# Patient Record
Sex: Female | Born: 1945 | Race: White | Hispanic: No | Smoking: Former smoker
Health system: Southern US, Community
[De-identification: ages and names within clinical notes are randomized; demographics above are authoritative.]

## PROBLEM LIST (undated history)

## (undated) DIAGNOSIS — M810 Age-related osteoporosis without current pathological fracture: Secondary | ICD-10-CM

## (undated) DIAGNOSIS — E559 Vitamin D deficiency, unspecified: Secondary | ICD-10-CM

## (undated) DIAGNOSIS — D179 Benign lipomatous neoplasm, unspecified: Secondary | ICD-10-CM

## (undated) DIAGNOSIS — H269 Unspecified cataract: Secondary | ICD-10-CM

## (undated) DIAGNOSIS — T7840XA Allergy, unspecified, initial encounter: Secondary | ICD-10-CM

## (undated) DIAGNOSIS — F419 Anxiety disorder, unspecified: Secondary | ICD-10-CM

## (undated) DIAGNOSIS — J449 Chronic obstructive pulmonary disease, unspecified: Secondary | ICD-10-CM

## (undated) DIAGNOSIS — D709 Neutropenia, unspecified: Secondary | ICD-10-CM

## (undated) DIAGNOSIS — J45909 Unspecified asthma, uncomplicated: Secondary | ICD-10-CM

## (undated) HISTORY — DX: Age-related osteoporosis without current pathological fracture: M81.0

## (undated) HISTORY — PX: EYE SURGERY: SHX253

## (undated) HISTORY — DX: Benign lipomatous neoplasm, unspecified: D17.9

## (undated) HISTORY — DX: Chronic obstructive pulmonary disease, unspecified: J44.9

## (undated) HISTORY — DX: Vitamin D deficiency, unspecified: E55.9

## (undated) HISTORY — DX: Anxiety disorder, unspecified: F41.9

## (undated) HISTORY — DX: Unspecified asthma, uncomplicated: J45.909

## (undated) HISTORY — DX: Neutropenia, unspecified: D70.9

## (undated) HISTORY — DX: Unspecified cataract: H26.9

## (undated) HISTORY — DX: Allergy, unspecified, initial encounter: T78.40XA

---

## 2008-09-03 ENCOUNTER — Ambulatory Visit: Payer: Self-pay | Admitting: Family Medicine

## 2009-09-07 ENCOUNTER — Emergency Department: Payer: Self-pay | Admitting: General Practice

## 2014-01-03 ENCOUNTER — Ambulatory Visit: Payer: Self-pay | Admitting: Physician Assistant

## 2014-05-12 ENCOUNTER — Ambulatory Visit: Payer: Self-pay | Admitting: Physician Assistant

## 2015-02-16 ENCOUNTER — Ambulatory Visit: Payer: Self-pay | Admitting: Family Medicine

## 2015-02-16 DIAGNOSIS — J45909 Unspecified asthma, uncomplicated: Secondary | ICD-10-CM | POA: Diagnosis not present

## 2015-02-16 DIAGNOSIS — Z79899 Other long term (current) drug therapy: Secondary | ICD-10-CM | POA: Diagnosis not present

## 2015-02-16 DIAGNOSIS — J9801 Acute bronchospasm: Secondary | ICD-10-CM | POA: Diagnosis not present

## 2015-02-19 ENCOUNTER — Ambulatory Visit: Payer: Self-pay | Admitting: Family Medicine

## 2015-02-19 DIAGNOSIS — J9801 Acute bronchospasm: Secondary | ICD-10-CM | POA: Diagnosis not present

## 2015-02-19 DIAGNOSIS — J40 Bronchitis, not specified as acute or chronic: Secondary | ICD-10-CM | POA: Diagnosis not present

## 2015-02-23 ENCOUNTER — Ambulatory Visit: Payer: Self-pay | Admitting: Family Medicine

## 2015-02-23 DIAGNOSIS — R05 Cough: Secondary | ICD-10-CM | POA: Diagnosis not present

## 2015-05-21 DIAGNOSIS — H3531 Nonexudative age-related macular degeneration: Secondary | ICD-10-CM | POA: Diagnosis not present

## 2015-06-05 ENCOUNTER — Encounter: Payer: Self-pay | Admitting: Family Medicine

## 2015-06-16 DIAGNOSIS — F419 Anxiety disorder, unspecified: Secondary | ICD-10-CM | POA: Insufficient documentation

## 2015-06-16 DIAGNOSIS — D709 Neutropenia, unspecified: Secondary | ICD-10-CM | POA: Insufficient documentation

## 2015-06-16 DIAGNOSIS — M85852 Other specified disorders of bone density and structure, left thigh: Secondary | ICD-10-CM | POA: Insufficient documentation

## 2015-06-16 DIAGNOSIS — E559 Vitamin D deficiency, unspecified: Secondary | ICD-10-CM | POA: Insufficient documentation

## 2015-06-16 DIAGNOSIS — M810 Age-related osteoporosis without current pathological fracture: Secondary | ICD-10-CM | POA: Insufficient documentation

## 2015-06-16 DIAGNOSIS — J45909 Unspecified asthma, uncomplicated: Secondary | ICD-10-CM | POA: Insufficient documentation

## 2015-06-16 DIAGNOSIS — D179 Benign lipomatous neoplasm, unspecified: Secondary | ICD-10-CM | POA: Insufficient documentation

## 2015-06-18 ENCOUNTER — Ambulatory Visit (INDEPENDENT_AMBULATORY_CARE_PROVIDER_SITE_OTHER): Payer: Medicare PPO | Admitting: Family Medicine

## 2015-06-18 ENCOUNTER — Encounter: Payer: Self-pay | Admitting: Family Medicine

## 2015-06-18 VITALS — BP 144/83 | HR 60 | Temp 98.4°F | Ht 60.5 in | Wt 144.0 lb

## 2015-06-18 DIAGNOSIS — F419 Anxiety disorder, unspecified: Secondary | ICD-10-CM

## 2015-06-18 DIAGNOSIS — J4520 Mild intermittent asthma, uncomplicated: Secondary | ICD-10-CM | POA: Diagnosis not present

## 2015-06-18 DIAGNOSIS — E559 Vitamin D deficiency, unspecified: Secondary | ICD-10-CM

## 2015-06-18 DIAGNOSIS — Z0000 Encounter for general adult medical examination without abnormal findings: Secondary | ICD-10-CM

## 2015-06-18 DIAGNOSIS — D709 Neutropenia, unspecified: Secondary | ICD-10-CM | POA: Diagnosis not present

## 2015-06-18 DIAGNOSIS — D179 Benign lipomatous neoplasm, unspecified: Secondary | ICD-10-CM

## 2015-06-18 MED ORDER — SERTRALINE HCL 100 MG PO TABS: 100.0000 mg | ORAL_TABLET | Freq: Every day | ORAL | Status: DC

## 2015-06-18 MED ORDER — BECLOMETHASONE DIPROPIONATE 40 MCG/ACT IN AERS: 2.0000 | INHALATION_SPRAY | Freq: Two times a day (BID) | RESPIRATORY_TRACT | Status: DC

## 2015-06-18 MED ORDER — ALBUTEROL SULFATE HFA 108 (90 BASE) MCG/ACT IN AERS: 2.0000 | INHALATION_SPRAY | RESPIRATORY_TRACT | Status: DC | PRN

## 2015-06-18 MED ORDER — MONTELUKAST SODIUM 10 MG PO TABS: 10.0000 mg | ORAL_TABLET | Freq: Every day | ORAL | Status: DC

## 2015-06-18 NOTE — Progress Notes (Signed)
Patient: Shelly Bridges, Female    DOB: 1946/10/24, 69 y.o.   MRN: [id number]  Visit Date: 06/18/2015  Today's Provider: Enid Derry, MD   Chief Complaint  Patient presents with  . Annual Exam    Medicare Wellness Exam    Subjective:   Shelly Bridges is a 69 y.o. female who presents today for her Subsequent Annual Wellness Visit.  Caregiver input:  none  HPI  No interval excitement  Review of Systems  Past Medical History  Diagnosis Date  . Anxiety   . Osteoporosis   . Vitamin D deficiency disease   . Asthma   . Neutropenia   . Lipoma   right shoulder lipoma  The patient does not have a history of falls. I did not complete a risk assessment for falls. A plan of care for falls was not documented. Depression screen PHQ 2/9 06/18/2015  Decreased Interest 0  Down, Depressed, Hopeless 0  PHQ - 2 Score 0   History reviewed. No pertinent past surgical history.  Family History  Problem Relation Age of Onset  . Dementia Mother   . Hypertension Mother   . Osteoporosis Mother   . Cancer Father     brain tumor and lung  . Hypertension Father   . Dementia Father   . Emphysema Father     History   Social History  . Marital Status: Divorced    Spouse Name: N/A  . Number of Children: 3  . Years of Education: N/A   Occupational History  . Not on file.   Social History Main Topics  . Smoking status: Former Smoker -- 1.00 packs/day for 20 years    Types: Cigarettes    Quit date: 06/17/1985  . Smokeless tobacco: Never Used  . Alcohol Use: No  . Drug Use: No  . Sexual Activity: Not on file   Other Topics Concern  . Not on file   Social History Narrative    Outpatient Encounter Prescriptions as of 06/18/2015  Medication Sig  . albuterol (PROAIR HFA) 108 (90 BASE) MCG/ACT inhaler Inhale 2 puffs into the lungs every 4 (four) hours as needed for wheezing or shortness of breath.  . beclomethasone (QVAR) 40 MCG/ACT inhaler Inhale 2 puffs into the  lungs 2 (two) times daily.  . montelukast (SINGULAIR) 10 MG tablet Take 1 tablet (10 mg total) by mouth at bedtime.  . [DISCONTINUED] albuterol (PROAIR HFA) 108 (90 BASE) MCG/ACT inhaler Inhale 2 puffs into the lungs every 6 (six) hours as needed for wheezing or shortness of breath.  . [DISCONTINUED] beclomethasone (QVAR) 40 MCG/ACT inhaler Inhale 2 puffs into the lungs 2 (two) times daily.  . [DISCONTINUED] montelukast (SINGULAIR) 10 MG tablet Take 10 mg by mouth at bedtime.  . [DISCONTINUED] sertraline (ZOLOFT) 50 MG tablet Take 75 mg by mouth daily.  . sertraline (ZOLOFT) 100 MG tablet Take 1 tablet (100 mg total) by mouth daily.   No facility-administered encounter medications on file as of 06/18/2015.   Functional Ability / Safety Screening 1.  Was the timed Get Up and Go test longer than 30 seconds?  no 2.  Does the patient need help with the phone, transportation, shopping,      preparing meals, housework, laundry, medications, or managing money?  no 3.  Does the patient's home have:  loose throw rugs in the hallway?   no      Grab bars in the bathroom? no      Handrails on the stairs?  not applicable      Poor lighting?   no 4.  Has the patient noticed any hearing difficulties?   no  Fall Risk Assessment See under rooming  Depression Screen See under rooming Depression screen [facility] 2/9 06/18/2015  Decreased Interest 0  Down, Depressed, Hopeless 0  PHQ - 2 Score 0   Advanced Directives Does patient have a HCPOA?    no If yes, name and contact information:  Does patient have a living will or MOST form?  no  Objective:   Vitals: BP 144/83 mmHg  Pulse 60  Temp(Src) 98.4 F (36.9 C)  Ht 5' 0.5" (1.537 m)  Wt 144 lb (65.318 kg)  BMI 27.65 kg/m2  SpO2 98% Body mass index is 27.65 kg/(m^2).  Visual Acuity Screening   Right eye Left eye Both eyes  Without correction:     With correction: 20/20 20/20    Physical Exam  Constitutional: She appears well-developed and  well-nourished. No distress.  HENT:  Head: Normocephalic and atraumatic.  Eyes: No scleral icterus.  Neck: No thyromegaly present.  Cardiovascular: Normal rate.   Pulmonary/Chest: Effort normal.  Abdominal: She exhibits no distension.  Musculoskeletal: She exhibits no edema.  Neurological: She is alert. She displays no tremor. Gait normal.  Get up and go test -- passed  Skin: No erythema. No pallor.  Psychiatric: She has a normal mood and affect. Her behavior is normal. Judgment and thought content normal.   Mood/affect:  euthymic Appearance:  casual  Cognitive Testing - 6-CIT  Correct? Score   What year is it? yes 0 Yes = 0    No = 4  What month is it? yes 0 Yes = 0    No = 3  Remember:     [address]ia [address], [place], [place]     What time is it? yes 0 Yes = 0    No = 3  Count backwards from 20 to 1 yes 1 Correct = 0    1 error = 2   More than 1 error = 4  Say the months of the year in reverse. yes 0 Correct = 0    1 error = 2   More than 1 error = 4  What address did I ask you to remember? yes 0 Correct = 0  1 error = 2    2 error = 4    3 error = 6    4 error = 8    All wrong = 10       TOTAL SCORE  1/28   Interpretation:  Normal  Normal (0-7) Abnormal (8-28)    Assessment & Plan:     Annual Wellness Visit  Reviewed patient's Family Medical History Reviewed and updated list of patient's medical providers Assessment of cognitive impairment was done Assessed patient's functional ability Established a written schedule for health screening services Health Risk Assessent Completed and Reviewed  Exercise Activities and Dietary recommendations Goals    None    She does not get regular activity; she is thinking about joining [employer]; asthma sometimes limits her, but no chest pain; had a bad spell of asthma in March; went to [facility] urgent care 3x, took 6-8 weeks to recover, URI Not getting 3 servings of calcium a day; encourage to get through diet, supplement only  if needed  Immunization History  Administered Date(s) Administered  . Pneumococcal Conjugate-13 07/03/2014    Health Maintenance  Topic Date Due  .  TETANUS/TDAP  02/18/1965  . ZOSTAVAX  02/18/2006  . MAMMOGRAM  09/03/2010  . COLONOSCOPY  06/17/2016 (Originally 02/19/1996)  . PNA vac Low Risk Adult (2 of 2 - PPSV23) 07/04/2015  . INFLUENZA VACCINE  07/06/2015  . DEXA SCAN  Completed   Declined shingles vaccine for now; education given, check insurance coverage; okay to call for that Rx Flu shots every fall Last mammogram was Sept 2009  Discussed health benefits of physical activity, and encouraged her to engage in regular exercise appropriate for her age and condition.   Meds ordered this encounter  Medications  . sertraline (ZOLOFT) 100 MG tablet    Sig: Take 1 tablet (100 mg total) by mouth daily.    Dispense:  30 tablet    Refill:  3    Pharmacist - we are changing dose  . beclomethasone (QVAR) 40 MCG/ACT inhaler    Sig: Inhale 2 puffs into the lungs 2 (two) times daily.    Dispense:  1 Inhaler    Refill:  11  . albuterol (PROAIR HFA) 108 (90 BASE) MCG/ACT inhaler    Sig: Inhale 2 puffs into the lungs every 4 (four) hours as needed for wheezing or shortness of breath.    Dispense:  1 Inhaler    Refill:  1  . montelukast (SINGULAIR) 10 MG tablet    Sig: Take 1 tablet (10 mg total) by mouth at bedtime.    Dispense:  30 tablet    Refill:  11    Current outpatient prescriptions:  .  albuterol (PROAIR HFA) 108 (90 BASE) MCG/ACT inhaler, Inhale 2 puffs into the lungs every 4 (four) hours as needed for wheezing or shortness of breath., Disp: 1 Inhaler, Rfl: 1 .  beclomethasone (QVAR) 40 MCG/ACT inhaler, Inhale 2 puffs into the lungs 2 (two) times daily., Disp: 1 Inhaler, Rfl: 11 .  montelukast (SINGULAIR) 10 MG tablet, Take 1 tablet (10 mg total) by mouth at bedtime., Disp: 30 tablet, Rfl: 11 .  sertraline (ZOLOFT) 100 MG tablet, Take 1 tablet (100 mg total) by mouth daily.,  Disp: 30 tablet, Rfl: 3 Medications Discontinued During This Encounter  Medication Reason  . sertraline (ZOLOFT) 50 MG tablet Dose change  . beclomethasone (QVAR) 40 MCG/ACT inhaler Reorder  . albuterol (PROAIR HFA) 108 (90 BASE) MCG/ACT inhaler Reorder  . montelukast (SINGULAIR) 10 MG tablet Reorder   Next Medicare Wellness Visit in 12+ months  Problem List Items Addressed This Visit      Respiratory   Asthma    Using rescue inhaler less than 2x a week; refills sent      Relevant Medications   beclomethasone (QVAR) 40 MCG/ACT inhaler   albuterol (PROAIR HFA) 108 (90 BASE) MCG/ACT inhaler   montelukast (SINGULAIR) 10 MG tablet     Other   Anxiety    Patient says she has normal stress, family says OCD is worse; let's try increasing the sertraline 100 mg      Relevant Medications   sertraline (ZOLOFT) 100 MG tablet   Vitamin D deficiency disease    Check level today and replace if needed; 1,000 iu vit D3 for maintenance      Relevant Orders   Vit D  25 hydroxy (rtn osteoporosis monitoring)   Neutropenia    Mildly low WBC last year, normal absolutes, just recheck      Relevant Orders   CBC with Differential/Platelet   Lipoma    Benign lesion on right shoulder  Other Visit Diagnoses    Preventative health care    -  Primary

## 2015-06-18 NOTE — Assessment & Plan Note (Signed)
Patient says she has normal stress, family says OCD is worse; let's try increasing the sertraline 100 mg

## 2015-06-18 NOTE — Assessment & Plan Note (Signed)
Benign lesion on right shoulder

## 2015-06-18 NOTE — Patient Instructions (Addendum)
Let's increase the sertraline to 100 mg daily Do please start counseling Let's do have you get labs today and we'll contact you with those results We'll give you the paperwork for the health care power of attorney and living will; review those with a family member and sign in the presence of a notary You will be due for a bone density study on or just after July 09, 2016 We do encouraged mammograms every 1-2 years Please return the stool cards at your earliest convenience If you change your mind and would like to have a colonoscopy, please just call Flu shots every fall If you would like a shingles vaccine, just call for that You are up-to-date on both pneumonia vaccines (PCV-13 and the PPSV-23); you currently do not need any further pneumonia vaccine boosters per the current ACIP guidelines Do try to get 3 servings of calcium a day I'll suggest a baby aspirin daily (81 mg coated) Return in 12+ months for next Medicare visit and 4 weeks for anxiety

## 2015-06-18 NOTE — Assessment & Plan Note (Signed)
Using rescue inhaler less than 2x a week; refills sent

## 2015-06-18 NOTE — Assessment & Plan Note (Signed)
Mildly low WBC last year, normal absolutes, just recheck

## 2015-06-18 NOTE — Assessment & Plan Note (Signed)
Check level today and replace if needed; 1,000 iu vit D3 for maintenance

## 2015-06-19 ENCOUNTER — Encounter: Payer: Self-pay | Admitting: Family Medicine

## 2015-06-19 LAB — VITAMIN D 25 HYDROXY (VIT D DEFICIENCY, FRACTURES): Vit D, 25-Hydroxy: 31.2 ng/mL (ref 30.0–100.0)

## 2015-06-19 LAB — CBC WITH DIFFERENTIAL/PLATELET
Basophils Absolute: 0 10*3/uL (ref 0.0–0.2)
Basos: 0 %
EOS (ABSOLUTE): 0.1 10*3/uL (ref 0.0–0.4)
EOS: 4 %
HEMOGLOBIN: 12.3 g/dL (ref 11.1–15.9)
Hematocrit: 37.6 % (ref 34.0–46.6)
IMMATURE GRANS (ABS): 0 10*3/uL (ref 0.0–0.1)
IMMATURE GRANULOCYTES: 0 %
LYMPHS ABS: 1.1 10*3/uL (ref 0.7–3.1)
LYMPHS: 35 %
MCH: 30.4 pg (ref 26.6–33.0)
MCHC: 32.7 g/dL (ref 31.5–35.7)
MCV: 93 fL (ref 79–97)
MONOCYTES: 8 %
Monocytes Absolute: 0.2 10*3/uL (ref 0.1–0.9)
NEUTROS ABS: 1.7 10*3/uL (ref 1.4–7.0)
Neutrophils: 53 %
Platelets: 172 10*3/uL (ref 150–379)
RBC: 4.04 x10E6/uL (ref 3.77–5.28)
RDW: 12.4 % (ref 12.3–15.4)
WBC: 3.1 10*3/uL — ABNORMAL LOW (ref 3.4–10.8)

## 2015-07-20 ENCOUNTER — Ambulatory Visit: Payer: Medicare PPO | Admitting: Family Medicine

## 2015-07-28 ENCOUNTER — Encounter: Payer: Self-pay | Admitting: Family Medicine

## 2015-07-28 ENCOUNTER — Ambulatory Visit (INDEPENDENT_AMBULATORY_CARE_PROVIDER_SITE_OTHER): Payer: Medicare PPO | Admitting: Family Medicine

## 2015-07-28 VITALS — BP 147/79 | HR 57 | Temp 97.5°F | Wt 143.0 lb

## 2015-07-28 DIAGNOSIS — E559 Vitamin D deficiency, unspecified: Secondary | ICD-10-CM | POA: Diagnosis not present

## 2015-07-28 DIAGNOSIS — I10 Essential (primary) hypertension: Secondary | ICD-10-CM

## 2015-07-28 DIAGNOSIS — F419 Anxiety disorder, unspecified: Secondary | ICD-10-CM | POA: Diagnosis not present

## 2015-07-28 DIAGNOSIS — D709 Neutropenia, unspecified: Secondary | ICD-10-CM | POA: Diagnosis not present

## 2015-07-28 NOTE — Progress Notes (Signed)
BP 147/79 mmHg  Pulse 57  Temp(Src) 97.5 F (36.4 C)  Wt 143 lb (64.864 kg)  SpO2 97%  Recheck 147/79, pulse 57  Subjective:    Patient ID: Shelly Bridges, female    DOB: 06/08/1946, 69 y.o.   MRN: [id number]  HPI: Shelly Bridges is a 69 y.o. female  Chief Complaint  Patient presents with  . Anxiety  . Immunizations    Advised patient that we will do Flu in Sept. She may get the shingles vaccine at her pharmacy. She decided to wait to on teatus.   We reviewed, Pneumovax was received at a health fair in early 2015; she then received PCV-13 here July 2015; the PPSV-23 was received after she turned 45  She is here for follow-up of anxiety; she has been on the 100 mg dose daily; more chilled out; still having trouble worrying and controlling the worrying; granddaughter has noticed some improvement; concerned about granddaughter; she has really backed off; patient is now keeping her cool, "it's her life" when talking about this boy she's seeing; no side effects; she wound like to stay at same dose for a while; she has not started to work with a counselor yet; she is trying to do this on her own; she is getting better  She has joined [employer]; she didn't get to go much last week though, had been going 1 hour a day prior to that, walking, exercises; noticed blood pressure up today; had potato chips for lunch yesterday; no canned soups or frozen dinners; drinks caffeine (sweet tea)  Taking one vitamin D pills daily; weight lifting at [employer] for osteoporosis  Taking ocuvite for eyes, mother had macular degeneration; sees eye doctor regularly  Relevant past medical, surgical, family and social history reviewed and updated as indicated. Interim medical history since our last visit reviewed. Allergies and medications reviewed and updated.  Review of Systems  Per HPI unless specifically indicated above     Objective:    BP 147/79 mmHg  Pulse 57   Temp(Src) 97.5 F (36.4 C)  Wt 143 lb (64.864 kg)  SpO2 97%  Wt Readings from Last 3 Encounters:  07/28/15 143 lb (64.864 kg)  06/18/15 144 lb (65.318 kg)  12/03/14 145 lb (65.772 kg)    Physical Exam  Constitutional: She appears well-developed and well-nourished. No distress.  HENT:  Head: Normocephalic and atraumatic.  Eyes: EOM are normal. No scleral icterus.  Neck: No thyromegaly present.  Cardiovascular: Normal rate and normal heart sounds.   No murmur heard. Pulmonary/Chest: Effort normal. No respiratory distress.  Abdominal: She exhibits no distension.  Musculoskeletal: Normal range of motion. She exhibits no edema.  Neurological: She is alert. She exhibits normal muscle tone.  Skin: Skin is warm and dry. She is not diaphoretic. No pallor.  Psychiatric: She has a normal mood and affect. Her speech is normal and behavior is normal. Judgment and thought content normal. Cognition and memory are normal.    Results for orders placed or performed in visit on 06/18/15  CBC with Differential/Platelet  Result Value Ref Range   WBC 3.1 (L) 3.4 - 10.8 x10E3/uL   RBC 4.04 3.77 - 5.28 x10E6/uL   Hemoglobin 12.3 11.1 - 15.9 g/dL   Hematocrit 37.6 34.0 - 46.6 %   MCV 93 79 - 97 fL   MCH 30.4 26.6 - 33.0 pg   MCHC 32.7 31.5 - 35.7 g/dL   RDW 12.4 12.3 - 15.4 %  Platelets 172 150 - 379 x10E3/uL   Neutrophils 53 %   Lymphs 35 %   Monocytes 8 %   Eos 4 %   Basos 0 %   Neutrophils Absolute 1.7 1.4 - 7.0 x10E3/uL   Lymphocytes Absolute 1.1 0.7 - 3.1 x10E3/uL   Monocytes Absolute 0.2 0.1 - 0.9 x10E3/uL   EOS (ABSOLUTE) 0.1 0.0 - 0.4 x10E3/uL   Basophils Absolute 0.0 0.0 - 0.2 x10E3/uL   Immature Granulocytes 0 %   Immature Grans (Abs) 0.0 0.0 - 0.1 x10E3/uL  Vit D  25 hydroxy (rtn osteoporosis monitoring)  Result Value Ref Range   Vit D, 25-Hydroxy 31.2 30.0 - 100.0 ng/mL      Assessment & Plan:   Problem List Items Addressed This Visit      Cardiovascular and Mediastinum    Essential hypertension, benign - Primary    Stress reduction is key; DASH guidelines; patient wishes to try stress reduction and limiting salt and caffeine and monitor BP away from doctor's office and call if top number over 150 mmHg        Other   Anxiety    Continue sertraline; suggested counseling      Vitamin D deficiency disease    Continue supplementation for bone health      Neutropenia    reviewed previous CBCs with her, explained diagnosis, absolute numbers reviewed, and I am not concerned         Follow up plan: Return in about 3 months (around 10/28/2015) for multiple things.   Meds ordered this encounter  Medications  . sertraline (ZOLOFT) 100 MG tablet    Sig: Take 1 tablet (100 mg total) by mouth daily.    Dispense:  30 tablet    Refill:  3

## 2015-07-28 NOTE — Assessment & Plan Note (Addendum)
reviewed previous CBCs with her, explained diagnosis, absolute numbers reviewed, and I am not concerned

## 2015-07-28 NOTE — Assessment & Plan Note (Signed)
Stress reduction is key; DASH guidelines; patient wishes to try stress reduction and limiting salt and caffeine and monitor BP away from doctor's office and call if top number over 150 mmHg

## 2015-07-28 NOTE — Patient Instructions (Addendum)
You are welcome to the shingles vaccine at any local participating pharmacy Be aware that you can shed live virus particles for up to 8 weeks after getting the vaccine, and avoid being around babies, pregnant women, and people undergoing chemotherapy We do recommend flu shots every fall If you do get injured and receive a puncture wound or deep scratch, then do absolutely come in for a tetanus booster (medicare should cover that) Please do call with an update in 4 weeks or so Try to follow the DASH guidelines     Try the Relaxation Response or meditation or the Serenity Prayer which may be helpful  Steps to Elicit the Relaxation Response The following is the technique reprinted with permission from Dr. Billie Ruddy book The Relaxation Response pages 162-163 1. Sit quietly in a comfortable position. 2. Close your eyes. 3. Deeply relax all your muscles,  beginning at your feet and progressing up to your face.  Keep them relaxed. 4. Breathe through your nose.  Become aware of your breathing.  As you breathe out, say the word, "one"*,  silently to yourself. For example,  breathe in ... out, "one",- in .. out, "one", etc.  Breathe easily and naturally. 5. Continue for 10 to 20 minutes.  You may open your eyes to check the time, but do not use an alarm.  When you finish, sit quietly for several minutes,  at first with your eyes closed and later with your eyes opened.  Do not stand up for a few minutes. 6. Do not worry about whether you are successful  in achieving a deep level of relaxation.  Maintain a passive attitude and permit relaxation to occur at its own pace.  When distracting thoughts occur,  try to ignore them by not dwelling upon them  and return to repeating "one."  With practice, the response should come with little effort.  Practice the technique once or twice daily,  but not within two hours after any meal,  since the digestive processes seem to interfere with   the elicitation of the Relaxation Response. * It is better to use a soothing, mellifluous sound, preferably with no meaning. or association, to avoid stimulation of unnecessary thoughts - a mantra. DASH Eating Plan DASH stands for "Dietary Approaches to Stop Hypertension." The DASH eating plan is a healthy eating plan that has been shown to reduce high blood pressure (hypertension). Additional health benefits may include reducing the risk of type 2 diabetes mellitus, heart disease, and stroke. The DASH eating plan may also help with weight loss. WHAT DO I NEED TO KNOW ABOUT THE DASH EATING PLAN? For the DASH eating plan, you will follow these general guidelines:  Choose foods with a percent daily value for sodium of less than 5% (as listed on the food label).  Use salt-free seasonings or herbs instead of table salt or sea salt.  Check with your health care provider or pharmacist before using salt substitutes.  Eat lower-sodium products, often labeled as "lower sodium" or "no salt added."  Eat fresh foods.  Eat more vegetables, fruits, and low-fat dairy products.  Choose whole grains. Look for the word "whole" as the first word in the ingredient list.  Choose fish and skinless chicken or [place] more often than red meat. Limit fish, poultry, and meat to 6 oz (170 g) each day.  Limit sweets, desserts, sugars, and sugary drinks.  Choose heart-healthy fats.  Limit cheese to 1 oz (28 g) per day.  Eat  more home-cooked food and less restaurant, buffet, and fast food.  Limit fried foods.  Cook foods using methods other than frying.  Limit canned vegetables. If you do use them, rinse them well to decrease the sodium.  When eating at a restaurant, ask that your food be prepared with less salt, or no salt if possible. WHAT FOODS CAN I EAT? Seek help from a dietitian for individual calorie needs. Grains Whole grain or whole wheat bread. Brown rice. Whole grain or whole wheat pasta.  Quinoa, bulgur, and whole grain cereals. Low-sodium cereals. Corn or whole wheat flour tortillas. Whole grain cornbread. Whole grain crackers. Low-sodium crackers. Vegetables Fresh or frozen vegetables (raw, steamed, roasted, or grilled). Low-sodium or reduced-sodium tomato and vegetable juices. Low-sodium or reduced-sodium tomato sauce and paste. Low-sodium or reduced-sodium canned vegetables.  Fruits All fresh, canned (in natural juice), or frozen fruits. Meat and Other Protein Products Ground beef (85% or leaner), grass-fed beef, or beef trimmed of fat. Skinless chicken or [place]. Ground chicken or [place]. Pork trimmed of fat. All fish and seafood. Eggs. Dried beans, peas, or lentils. Unsalted nuts and seeds. Unsalted canned beans. Dairy Low-fat dairy products, such as skim or 1% milk, 2% or reduced-fat cheeses, low-fat ricotta or cottage cheese, or plain low-fat yogurt. Low-sodium or reduced-sodium cheeses. Fats and Oils Tub margarines without trans fats. Light or reduced-fat mayonnaise and salad dressings (reduced sodium). Avocado. Safflower, olive, or canola oils. Natural peanut or almond butter. Other Unsalted popcorn and pretzels. The items listed above may not be a complete list of recommended foods or beverages. Contact your dietitian for more options. WHAT FOODS ARE NOT RECOMMENDED? Grains White bread. White pasta. White rice. Refined cornbread. Bagels and croissants. Crackers that contain trans fat. Vegetables Creamed or fried vegetables. Vegetables in a cheese sauce. Regular canned vegetables. Regular canned tomato sauce and paste. Regular tomato and vegetable juices. Fruits Dried fruits. Canned fruit in light or heavy syrup. Fruit juice. Meat and Other Protein Products Fatty cuts of meat. Ribs, chicken wings, bacon, sausage, bologna, salami, chitterlings, fatback, hot dogs, bratwurst, and packaged luncheon meats. Salted nuts and seeds. Canned beans with salt. Dairy Whole or 2%  milk, cream, half-and-half, and cream cheese. Whole-fat or sweetened yogurt. Full-fat cheeses or blue cheese. Nondairy creamers and whipped toppings. Processed cheese, cheese spreads, or cheese curds. Condiments Onion and garlic salt, seasoned salt, table salt, and sea salt. Canned and packaged gravies. Worcestershire sauce. Tartar sauce. Barbecue sauce. Teriyaki sauce. Soy sauce, including reduced sodium. Steak sauce. Fish sauce. Oyster sauce. Cocktail sauce. Horseradish. Ketchup and mustard. Meat flavorings and tenderizers. Bouillon cubes. Hot sauce. Tabasco sauce. Marinades. Taco seasonings. Relishes. Fats and Oils Butter, stick margarine, lard, shortening, ghee, and bacon fat. Coconut, palm kernel, or palm oils. Regular salad dressings. Other Pickles and olives. Salted popcorn and pretzels. The items listed above may not be a complete list of foods and beverages to avoid. Contact your dietitian for more information. WHERE CAN I FIND MORE INFORMATION? National Heart, Lung, and Blood Institute: travelstabloid.com Document Released: 11/10/2011 Document Revised: 04/07/2014 Document Reviewed: 09/25/2013 [facility] Patient Information 2015 [place], [place]. This information is not intended to replace advice given to you by your health care provider. Make sure you discuss any questions you have with your health care provider.

## 2015-07-30 MED ORDER — SERTRALINE HCL 100 MG PO TABS: 100.0000 mg | ORAL_TABLET | Freq: Every day | ORAL | Status: DC

## 2015-07-30 NOTE — Assessment & Plan Note (Signed)
Continue sertraline; suggested counseling

## 2015-07-30 NOTE — Assessment & Plan Note (Signed)
Continue supplementation for bone health

## 2015-09-07 ENCOUNTER — Telehealth: Payer: Self-pay | Admitting: Family Medicine

## 2015-09-07 NOTE — Telephone Encounter (Signed)
Pt called stated she needs the names of the counselors Dr. Sanda Klein recommended. Please contact pt with their names ASAP. Thanks.

## 2015-09-07 NOTE — Telephone Encounter (Signed)
I just sent the list to your printer; thanks

## 2015-09-07 NOTE — Telephone Encounter (Signed)
Dr.Lada is this the list that the other providers use?

## 2015-09-09 NOTE — Telephone Encounter (Signed)
Tiffany, did you get these papers for her?

## 2015-09-11 NOTE — Telephone Encounter (Signed)
Yes, I sent this to her.

## 2015-10-19 IMAGING — CR DG CHEST 2V
2 series · 2 of 2 positions shown · non-contrast
Comparison: None.

CLINICAL DATA: Cough for 2 weeks

EXAM:
CHEST  2 VIEW

[chest pa]
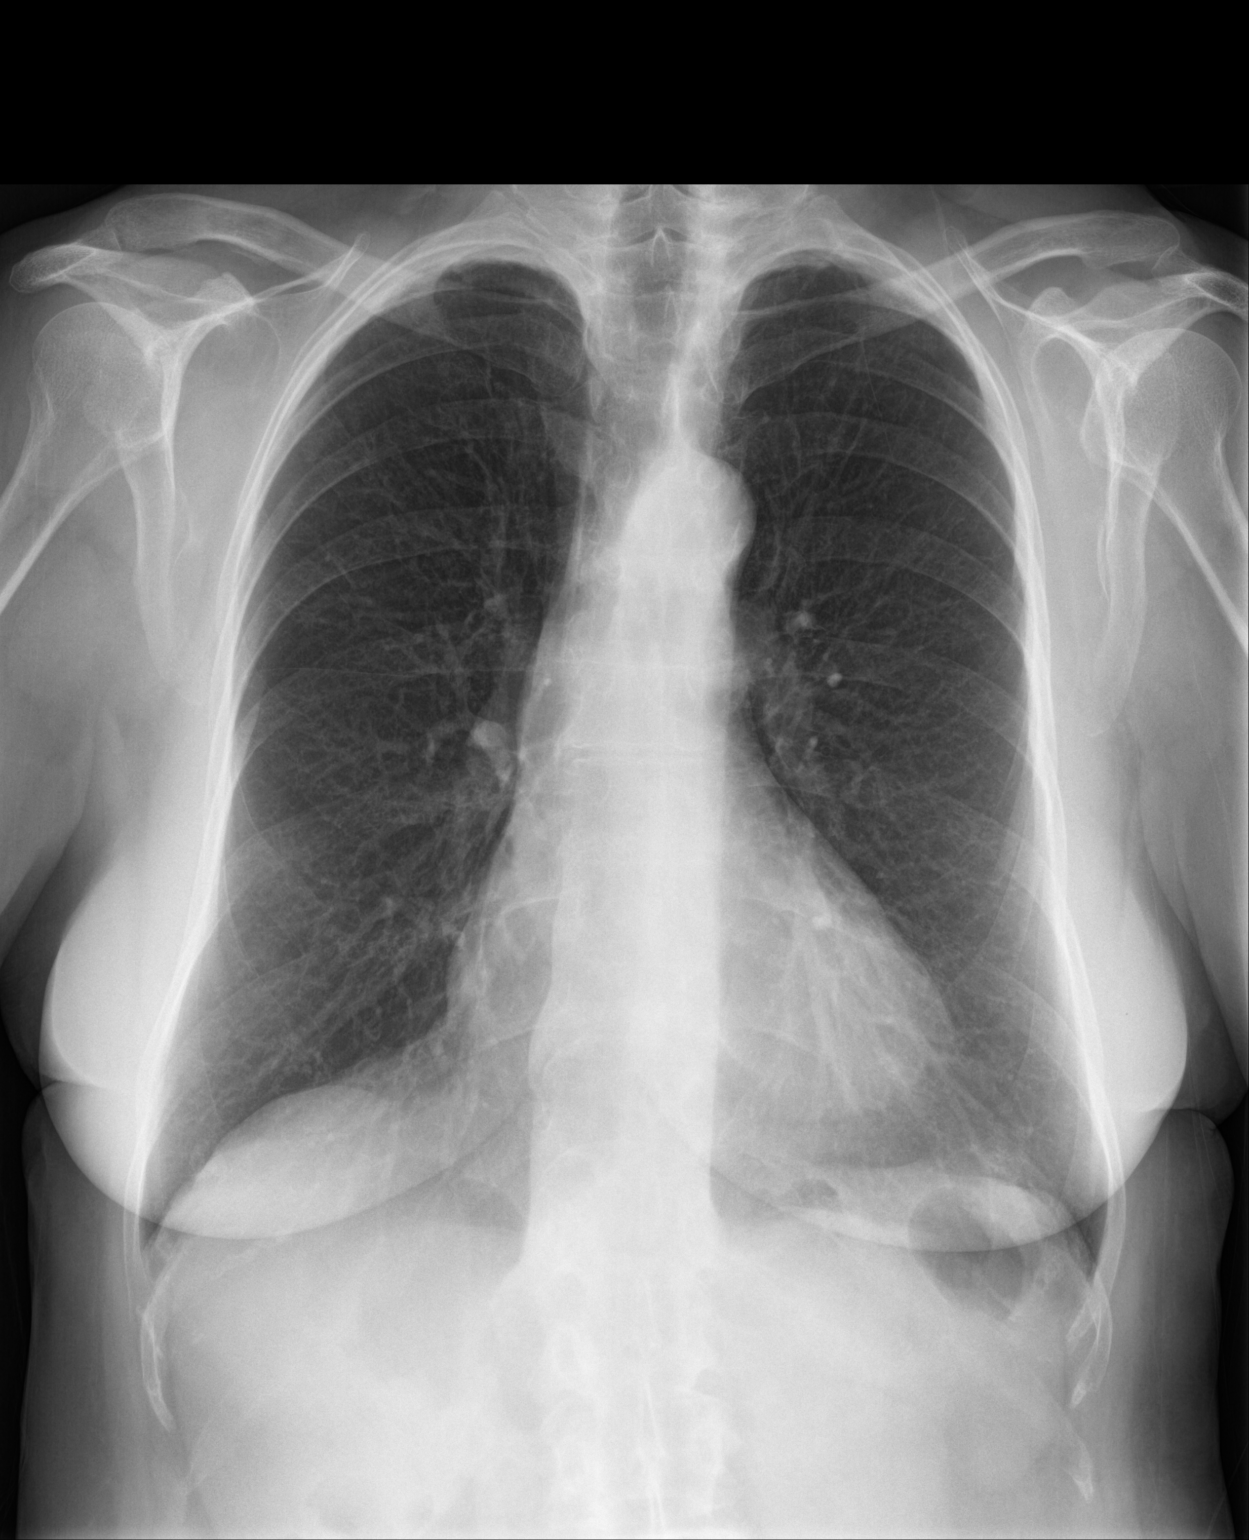

[chest lat]
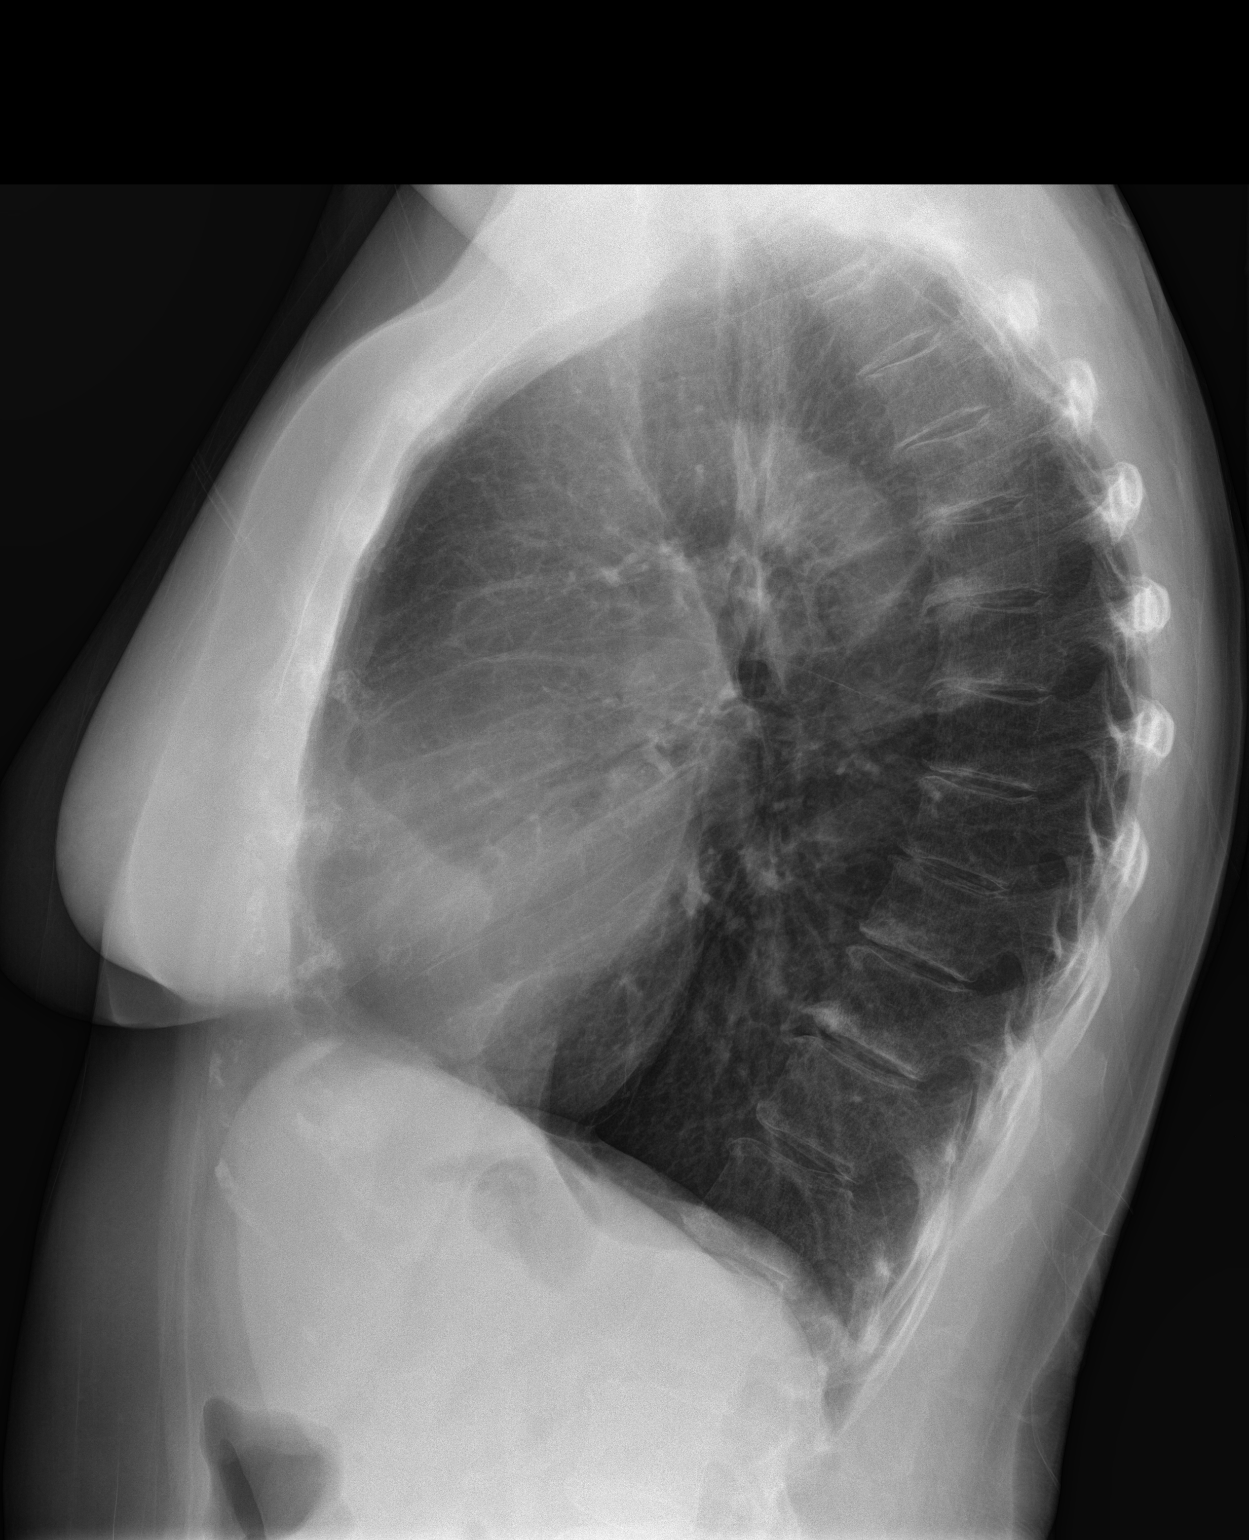

[2 of 2 positions shown; findings below may reference images not displayed]

FINDINGS: The heart size and mediastinal contours are within normal limits.
There is no focal infiltrate, pulmonary edema, or pleural effusion.
Degenerative joint changes of the spine are noted.
IMPRESSION: No active cardiopulmonary disease.

## 2015-10-26 ENCOUNTER — Encounter: Payer: Self-pay | Admitting: Family Medicine

## 2015-10-26 ENCOUNTER — Ambulatory Visit (INDEPENDENT_AMBULATORY_CARE_PROVIDER_SITE_OTHER): Payer: Medicare PPO | Admitting: Family Medicine

## 2015-10-26 VITALS — BP 150/76 | HR 62 | Temp 97.4°F | Ht 59.75 in | Wt 148.0 lb

## 2015-10-26 DIAGNOSIS — Z23 Encounter for immunization: Secondary | ICD-10-CM | POA: Diagnosis not present

## 2015-10-26 DIAGNOSIS — F419 Anxiety disorder, unspecified: Secondary | ICD-10-CM

## 2015-10-26 DIAGNOSIS — I10 Essential (primary) hypertension: Secondary | ICD-10-CM

## 2015-10-26 MED ORDER — SERTRALINE HCL 100 MG PO TABS: 150.0000 mg | ORAL_TABLET | Freq: Every day | ORAL | Status: DC

## 2015-10-26 NOTE — Progress Notes (Signed)
BP 150/76 mmHg  Pulse 62  Temp(Src) 97.4 F (36.3 C)  Ht 4' 11.75" (1.518 m)  Wt 148 lb (67.132 kg)  BMI 29.13 kg/m2  SpO2 97%   Subjective:    Patient ID: Shelly Bridges, female    DOB: 07-31-1946, 69 y.o.   MRN: [id number]  HPI: Shelly Bridges is a 69 y.o. female  Chief Complaint  Patient presents with  . Hypertension  . Anxiety    Had increased Sertraline to 100mg  at last appt. but some days she thinks she could do 150mg .   Her anxiety and her blood pressure go together she says; her blood pressure is usually better than this, but she notices it goes up when upset She called back after our last appt to get the names of some of the counselors, but patient has not called yet to set up an appt to start counseling; she plans on doing so She discussed her granddaughter's engagement and her choice in fiance  She is upset about the choices she is making, wants better for her granddaughter  Just got flu and pneumonia vaccines before I walked in the door  Relevant past medical history reviewed and updated as indicated. Interim medical history since our last visit reviewed. Allergies and medications reviewed and updated.  Review of Systems Per HPI unless specifically indicated above     Objective:    BP 150/76 mmHg  Pulse 62  Temp(Src) 97.4 F (36.3 C)  Ht 4' 11.75" (1.518 m)  Wt 148 lb (67.132 kg)  BMI 29.13 kg/m2  SpO2 97%  Wt Readings from Last 3 Encounters:  10/26/15 148 lb (67.132 kg)  07/28/15 143 lb (64.864 kg)  06/18/15 144 lb (65.318 kg)    Physical Exam  Constitutional: She appears well-developed and well-nourished.  Cardiovascular: Normal rate.   Pulmonary/Chest: Effort normal.  Neurological: She displays no tremor.  Psychiatric: Her mood appears anxious. Her affect is not angry, not labile and not inappropriate. Her speech is not rapid and/or pressured. She is not agitated, not hyperactive, not slowed and not withdrawn. Cognition and  memory are not impaired. She does not exhibit a depressed mood. She is attentive.       Assessment & Plan:   Problem List Items Addressed This Visit      Cardiovascular and Mediastinum   Essential hypertension, benign    Not to goal, but patient will monitor this; she does not want additional medicine, believes the BP is tied to her anxiety; no stress is worth a stroke; counseling, stress reduction encouraged        Other   Anxiety - Primary    Encouraged counseling; she has the names and numbers and will call to schedule her own appt; increase sertraline from 100 mg to 150 mg daily; call with update in 3-4 weeks      Relevant Medications   sertraline (ZOLOFT) 100 MG tablet   Need for 23-polyvalent pneumococcal polysaccharide vaccine    PPSV-23 given today; no further boosters of this needed per current  ACIP guidelines      Relevant Orders   Pneumococcal polysaccharide vaccine 23-valent greater than or equal to 2yo subcutaneous/IM   Needs flu shot    Flu vaccine offered and given       Other Visit Diagnoses    Encounter for immunization           Follow up plan: Return when due, for Medicare wellness visit.  Meds ordered this encounter  Medications  .  sertraline (ZOLOFT) 100 MG tablet    Sig: Take 1.5 tablets (150 mg total) by mouth daily.    Dispense:  45 tablet    Refill:  3   An after-visit summary was printed and given to the patient at [facility].  Please see the patient instructions which may contain other information and recommendations beyond what is mentioned above in the assessment and plan.  Face-to-face time with patient was more than 15 minutes, >50% time spent counseling and coordination of care ------------------------------------ NOTE: this document is finished; I just can't close it out because vaccine administration needs to be completed Dr. Sanda Klein

## 2015-10-26 NOTE — Patient Instructions (Addendum)
Please do start to work with a counselor, as I think it will help Increase the medicine from 100 to 150 mg daily Call me in 3-4 weeks with an update You received the flu shot today; it should protect you against the flu virus over the coming months; it will take about two weeks for antibodies to develop; do try to stay away from hospitals, nursing homes, and daycares during peak flu season; taking extra vitamin C daily during flu season may help you avoid getting sick You have received the Pneumovax vaccine (PPSV-23) and you will not need another booster of this for the rest of your life per current ACIP guidelines Return for next Medicare Wellness visit, but certainly within 6 months if Medicare visit is after that

## 2015-10-26 NOTE — Assessment & Plan Note (Addendum)
Encouraged counseling; she has the names and numbers and will call to schedule her own appt; increase sertraline from 100 mg to 150 mg daily; call with update in 3-4 weeks

## 2015-11-01 DIAGNOSIS — Z23 Encounter for immunization: Secondary | ICD-10-CM | POA: Insufficient documentation

## 2015-11-01 NOTE — Assessment & Plan Note (Signed)
Flu vaccine offered and given

## 2015-11-01 NOTE — Assessment & Plan Note (Signed)
Not to goal, but patient will monitor this; she does not want additional medicine, believes the BP is tied to her anxiety; no stress is worth a stroke; counseling, stress reduction encouraged

## 2015-11-01 NOTE — Assessment & Plan Note (Signed)
PPSV-23 given today; no further boosters of this needed per current  ACIP guidelines

## 2016-06-03 ENCOUNTER — Ambulatory Visit (INDEPENDENT_AMBULATORY_CARE_PROVIDER_SITE_OTHER): Payer: Medicare Other | Admitting: Unknown Physician Specialty

## 2016-06-03 ENCOUNTER — Encounter: Payer: Self-pay | Admitting: Unknown Physician Specialty

## 2016-06-03 VITALS — BP 147/84 | HR 68 | Temp 97.8°F | Ht 60.7 in | Wt 147.0 lb

## 2016-06-03 DIAGNOSIS — F339 Major depressive disorder, recurrent, unspecified: Secondary | ICD-10-CM | POA: Insufficient documentation

## 2016-06-03 DIAGNOSIS — J4520 Mild intermittent asthma, uncomplicated: Secondary | ICD-10-CM | POA: Diagnosis not present

## 2016-06-03 DIAGNOSIS — G4441 Drug-induced headache, not elsewhere classified, intractable: Secondary | ICD-10-CM

## 2016-06-03 DIAGNOSIS — R42 Dizziness and giddiness: Secondary | ICD-10-CM

## 2016-06-03 DIAGNOSIS — F332 Major depressive disorder, recurrent severe without psychotic features: Secondary | ICD-10-CM

## 2016-06-03 DIAGNOSIS — F329 Major depressive disorder, single episode, unspecified: Secondary | ICD-10-CM | POA: Insufficient documentation

## 2016-06-03 MED ORDER — SERTRALINE HCL 50 MG PO TABS: 50.0000 mg | ORAL_TABLET | Freq: Every day | ORAL | Status: DC

## 2016-06-03 MED ORDER — FLUTICASONE PROPIONATE HFA 110 MCG/ACT IN AERO
2.0000 | INHALATION_SPRAY | Freq: Two times a day (BID) | RESPIRATORY_TRACT | Status: DC
Start: 2016-06-03 — End: 2017-06-09

## 2016-06-03 NOTE — Progress Notes (Signed)
+-  BP 147/84 mmHg  Pulse 68  Temp(Src) 97.8 F (36.6 C)  Ht 5' 0.7" (1.542 m)  Wt 147 lb (66.679 kg)  BMI 28.04 kg/m2  SpO2 97%   Subjective:    Patient ID: Shelly Bridges, female    DOB: 03/19/1946, 70 y.o.   MRN: [id number]  HPI: Shelly Bridges is a 70 y.o. female  Chief Complaint  Patient presents with  . URI    pt states she has a headache, dizziness, and sinus pressure. States symptoms started about a month ago.    Sinusitis This is a new problem. The current episode started more than 1 month ago. The problem has been gradually worsening since onset. There has been no fever. The pain is moderate. Associated symptoms include congestion, coughing, ear pain, headaches, a hoarse voice and sinus pressure. Pertinent negatives include no chills, shortness of breath or sore throat. Past treatments include nothing (using inhalers).   Depression Stopped Zoloft because she feels she didn't need it and finds that she is having a lot of depression.  Stopped cold [place] about a month ago that seems to coincide with these symptoms.   Depression screen [facility] 2/9 06/03/2016 06/18/2015  Decreased Interest 3 0  Down, Depressed, Hopeless 3 0  PHQ - 2 Score 6 0  Altered sleeping 3 -  Tired, decreased energy 3 -  Change in appetite 2 -  Feeling bad or failure about yourself  1 -  Trouble concentrating 1 -  Moving slowly or fidgety/restless 2 -  Suicidal thoughts 0 -  PHQ-9 Score 18 -    Asthma Seems to be stable but Qvar no longer covered.    Relevant past medical, surgical, family and social history reviewed and updated as indicated. Interim medical history since our last visit reviewed. Allergies and medications reviewed and updated.  Review of Systems  Constitutional: Negative for chills.  HENT: Positive for congestion, ear pain, hoarse voice and sinus pressure. Negative for sore throat.   Respiratory: Positive for cough. Negative for shortness of breath.    Neurological: Positive for headaches.    Per HPI unless specifically indicated above     Objective:    BP 147/84 mmHg  Pulse 68  Temp(Src) 97.8 F (36.6 C)  Ht 5' 0.7" (1.542 m)  Wt 147 lb (66.679 kg)  BMI 28.04 kg/m2  SpO2 97%  Wt Readings from Last 3 Encounters:  06/03/16 147 lb (66.679 kg)  10/26/15 148 lb (67.132 kg)  07/28/15 143 lb (64.864 kg)    Physical Exam  Constitutional: She is oriented to person, place, and time. She appears well-developed and well-nourished.  HENT:  Head: Normocephalic and atraumatic.  Eyes: Pupils are equal, round, and reactive to light. Right eye exhibits no discharge. Left eye exhibits no discharge. No scleral icterus.  Neck: Normal range of motion. Neck supple. Carotid bruit is not present. No thyromegaly present.  Cardiovascular: Normal rate, regular rhythm and normal heart sounds.  Exam reveals no gallop and no friction rub.   No murmur heard. Pulmonary/Chest: Effort normal and breath sounds normal. No respiratory distress. She has no wheezes. She has no rales.  Abdominal: Soft. Bowel sounds are normal. There is no tenderness. There is no rebound.  Genitourinary: No breast swelling, tenderness or discharge.  Musculoskeletal: Normal range of motion.  Lymphadenopathy:    She has no cervical adenopathy.  Neurological: She is alert and oriented to person, place, and time.  Skin: Skin is warm, dry and intact.  No rash noted.  Psychiatric: She has a normal mood and affect. Her speech is normal and behavior is normal. Judgment and thought content normal. Cognition and memory are normal.    Results for orders placed or performed in visit on 06/18/15  CBC with Differential/Platelet  Result Value Ref Range   WBC 3.1 (L) 3.4 - 10.8 x10E3/uL   RBC 4.04 3.77 - 5.28 x10E6/uL   Hemoglobin 12.3 11.1 - 15.9 g/dL   Hematocrit 37.6 34.0 - 46.6 %   MCV 93 79 - 97 fL   MCH 30.4 26.6 - 33.0 pg   MCHC 32.7 31.5 - 35.7 g/dL   RDW 12.4 12.3 - 15.4 %    Platelets 172 150 - 379 x10E3/uL   Neutrophils 53 %   Lymphs 35 %   Monocytes 8 %   Eos 4 %   Basos 0 %   Neutrophils Absolute 1.7 1.4 - 7.0 x10E3/uL   Lymphocytes Absolute 1.1 0.7 - 3.1 x10E3/uL   Monocytes Absolute 0.2 0.1 - 0.9 x10E3/uL   EOS (ABSOLUTE) 0.1 0.0 - 0.4 x10E3/uL   Basophils Absolute 0.0 0.0 - 0.2 x10E3/uL   Immature Granulocytes 0 %   Immature Grans (Abs) 0.0 0.0 - 0.1 x10E3/uL  Vit D  25 hydroxy (rtn osteoporosis monitoring)  Result Value Ref Range   Vit D, 25-Hydroxy 31.2 30.0 - 100.0 ng/mL      Assessment & Plan:   Problem List Items Addressed This Visit      Unprioritized   Asthma    Change from Qvar to Flovent for insurance issues      Relevant Medications   fluticasone (FLOVENT HFA) 110 MCG/ACT inhaler   Major depression (HCC) - Primary   Relevant Medications   sertraline (ZOLOFT) 50 MG tablet    Other Visit Diagnoses    Intractable drug-induced headache, not elsewhere classified        Relevant Medications    sertraline (ZOLOFT) 50 MG tablet    Dizzy           Suspect above symptoms related not to sinuses but to stopping a fairly large dose of Zoloft abruptly.  Restart Zoloft at 50 mg.    Follow up plan: Return in about 4 weeks (around 07/01/2016).

## 2016-06-03 NOTE — Assessment & Plan Note (Signed)
Change from Qvar to [employer] for insurance issues

## 2016-06-21 ENCOUNTER — Ambulatory Visit: Payer: Medicare PPO | Admitting: Family Medicine

## 2016-07-01 ENCOUNTER — Ambulatory Visit (INDEPENDENT_AMBULATORY_CARE_PROVIDER_SITE_OTHER): Payer: Medicare Other | Admitting: Unknown Physician Specialty

## 2016-07-01 ENCOUNTER — Encounter: Payer: Self-pay | Admitting: Unknown Physician Specialty

## 2016-07-01 DIAGNOSIS — F332 Major depressive disorder, recurrent severe without psychotic features: Secondary | ICD-10-CM | POA: Diagnosis not present

## 2016-07-01 MED ORDER — SERTRALINE HCL 50 MG PO TABS: 50.0000 mg | ORAL_TABLET | Freq: Every day | ORAL | 3 refills | Status: DC

## 2016-07-01 NOTE — Progress Notes (Signed)
BP 122/68 (BP Location: Left Arm, Patient Position: Sitting, Cuff Size: Normal)   Pulse 64   Temp 98.5 F (36.9 C)   Ht 5\' 1"  (1.549 m)   Wt 145 lb (65.8 kg)   SpO2 96%   BMI 27.40 kg/m    Subjective:    Patient ID: Shelly Bridges, female    DOB: [birth year], 70 y.o.   MRN: [id number]  HPI: Shelly Bridges is a 70 y.o. female  Chief Complaint  Patient presents with  . Depression   Depression Pt had stopped Zoloft before her previous visit and had a great deal of depression following.  She is doing much better today.  Depression screen [facility] 2/9 07/01/2016 06/03/2016 06/18/2015  Decreased Interest 0 3 0  Down, Depressed, Hopeless 0 3 0  PHQ - 2 Score 0 6 0  Altered sleeping - 3 -  Tired, decreased energy - 3 -  Change in appetite - 2 -  Feeling bad or failure about yourself  - 1 -  Trouble concentrating - 1 -  Moving slowly or fidgety/restless - 2 -  Suicidal thoughts - 0 -  PHQ-9 Score - 18 -      Relevant past medical, surgical, family and social history reviewed and updated as indicated. Interim medical history since our last visit reviewed. Allergies and medications reviewed and updated.  Review of Systems  Per HPI unless specifically indicated above     Objective:    BP 122/68 (BP Location: Left Arm, Patient Position: Sitting, Cuff Size: Normal)   Pulse 64   Temp 98.5 F (36.9 C)   Ht 5\' 1"  (1.549 m)   Wt 145 lb (65.8 kg)   SpO2 96%   BMI 27.40 kg/m   Wt Readings from Last 3 Encounters:  07/01/16 145 lb (65.8 kg)  06/03/16 147 lb (66.7 kg)  10/26/15 148 lb (67.1 kg)    Physical Exam  Constitutional: She is oriented to person, place, and time. She appears well-developed and well-nourished. No distress.  HENT:  Head: Normocephalic and atraumatic.  Eyes: Conjunctivae and lids are normal. Right eye exhibits no discharge. Left eye exhibits no discharge. No scleral icterus.  Cardiovascular: Normal rate.   Pulmonary/Chest: Effort normal.    Abdominal: Normal appearance. There is no splenomegaly or hepatomegaly.  Musculoskeletal: Normal range of motion.  Neurological: She is alert and oriented to person, place, and time.  Skin: Skin is intact. No rash noted. No pallor.  Psychiatric: She has a normal mood and affect. Her behavior is normal. Judgment and thought content normal.    Results for orders placed or performed in visit on 06/18/15  CBC with Differential/Platelet  Result Value Ref Range   WBC 3.1 (L) 3.4 - 10.8 x10E3/uL   RBC 4.04 3.77 - 5.28 x10E6/uL   Hemoglobin 12.3 11.1 - 15.9 g/dL   Hematocrit 37.6 34.0 - 46.6 %   MCV 93 79 - 97 fL   MCH 30.4 26.6 - 33.0 pg   MCHC 32.7 31.5 - 35.7 g/dL   RDW 12.4 12.3 - 15.4 %   Platelets 172 150 - 379 x10E3/uL   Neutrophils 53 %   Lymphs 35 %   Monocytes 8 %   Eos 4 %   Basos 0 %   Neutrophils Absolute 1.7 1.4 - 7.0 x10E3/uL   Lymphocytes Absolute 1.1 0.7 - 3.1 x10E3/uL   Monocytes Absolute 0.2 0.1 - 0.9 x10E3/uL   EOS (ABSOLUTE) 0.1 0.0 - 0.4 x10E3/uL  Basophils Absolute 0.0 0.0 - 0.2 x10E3/uL   Immature Granulocytes 0 %   Immature Grans (Abs) 0.0 0.0 - 0.1 x10E3/uL  Vit D  25 hydroxy (rtn osteoporosis monitoring)  Result Value Ref Range   Vit D, 25-Hydroxy 31.2 30.0 - 100.0 ng/mL      Assessment & Plan:   Problem List Items Addressed This Visit      Unprioritized   Major depression ([place])    Pt is doing well on current dose.  She is not interested in weaning off.        Relevant Medications   sertraline (ZOLOFT) 50 MG tablet    Other Visit Diagnoses   None.      Follow up plan: Return if symptoms worsen or fail to improve, for Needs wellness visit.

## 2016-07-01 NOTE — Assessment & Plan Note (Signed)
Pt is doing well on current dose.  She is not interested in weaning off.

## 2016-08-12 ENCOUNTER — Other Ambulatory Visit: Payer: Self-pay | Admitting: Unknown Physician Specialty

## 2016-09-30 ENCOUNTER — Encounter: Payer: Self-pay | Admitting: Unknown Physician Specialty

## 2016-09-30 ENCOUNTER — Ambulatory Visit (INDEPENDENT_AMBULATORY_CARE_PROVIDER_SITE_OTHER): Payer: Medicare Other | Admitting: Unknown Physician Specialty

## 2016-09-30 VITALS — BP 152/87 | HR 76 | Temp 97.9°F | Ht 60.0 in | Wt 145.0 lb

## 2016-09-30 DIAGNOSIS — I10 Essential (primary) hypertension: Secondary | ICD-10-CM

## 2016-09-30 DIAGNOSIS — F332 Major depressive disorder, recurrent severe without psychotic features: Secondary | ICD-10-CM

## 2016-09-30 DIAGNOSIS — J4541 Moderate persistent asthma with (acute) exacerbation: Secondary | ICD-10-CM | POA: Diagnosis not present

## 2016-09-30 DIAGNOSIS — I499 Cardiac arrhythmia, unspecified: Secondary | ICD-10-CM | POA: Diagnosis not present

## 2016-09-30 DIAGNOSIS — Z0000 Encounter for general adult medical examination without abnormal findings: Secondary | ICD-10-CM

## 2016-09-30 DIAGNOSIS — J4540 Moderate persistent asthma, uncomplicated: Secondary | ICD-10-CM | POA: Diagnosis not present

## 2016-09-30 MED ORDER — PREDNISONE 20 MG PO TABS: 40.0000 mg | ORAL_TABLET | Freq: Every day | ORAL | 0 refills | Status: DC

## 2016-09-30 MED ORDER — ALBUTEROL SULFATE HFA 108 (90 BASE) MCG/ACT IN AERS: 2.0000 | INHALATION_SPRAY | RESPIRATORY_TRACT | 1 refills | Status: DC | PRN

## 2016-09-30 MED ORDER — HYDROCOD POLST-CPM POLST ER 10-8 MG/5ML PO SUER: 5.0000 mL | Freq: Two times a day (BID) | ORAL | 0 refills | Status: DC | PRN

## 2016-09-30 NOTE — Assessment & Plan Note (Signed)
Stable, continue present medications.   

## 2016-09-30 NOTE — Assessment & Plan Note (Signed)
Recheck in 1 month.  Bring readings from home

## 2016-09-30 NOTE — Progress Notes (Signed)
BP (!) 152/87 (BP Location: Left Arm, Patient Position: Sitting, Cuff Size: Large)   Pulse 76   Temp 97.9 F (36.6 C)   Ht 5' (1.524 m)   Wt 145 lb (65.8 kg)   SpO2 96%   BMI 28.32 kg/m    Subjective:    Patient ID: Shelly Bridges, female    DOB: 1946/09/20, 70 y.o.   MRN: [id number]  HPI: Shelly Bridges is a 70 y.o. female  Chief Complaint  Patient presents with  . Medicare Wellness  . URI    pt states she has had a cough for a few weeks now. States tussinex and prednisone have worked in the past   . Medication Refill    pt states she needs refills on albuterol and singulair    Functional Status Survey: Is the patient deaf or have difficulty hearing?: No Does the patient have difficulty seeing, even when wearing glasses/contacts?: No Does the patient have difficulty concentrating, remembering, or making decisions?: No Does the patient have difficulty walking or climbing stairs?: No Does the patient have difficulty dressing or bathing?: No Does the patient have difficulty doing errands alone such as visiting a doctor's office or shopping?: No  Fall Risk  09/30/2016 07/01/2016 06/18/2015  Falls in the past year? No No No   Depression Sell controlled  Depression screen [facility] 2/9 09/30/2016 07/01/2016 06/03/2016 06/18/2015  Decreased Interest 1 0 3 0  Down, Depressed, Hopeless 1 0 3 0  PHQ - 2 Score 2 0 6 0  Altered sleeping - - 3 -  Tired, decreased energy - - 3 -  Change in appetite - - 2 -  Feeling bad or failure about yourself  - - 1 -  Trouble concentrating - - 1 -  Moving slowly or fidgety/restless - - 2 -  Suicidal thoughts - - 0 -  PHQ-9 Score - - 18 -   Asthma Only takes steroid inhaler when "this is coming on" and has been using for a week.  Admits to needing it more often but does not take due to cost.   Cough  This is a new problem. The current episode started yesterday. The problem has been gradually worsening. The problem occurs  constantly. The cough is non-productive. Associated symptoms include postnasal drip and shortness of breath. Pertinent negatives include no chills, rash or sore throat. Nothing aggravates the symptoms. She has tried a beta-agonist inhaler and steroid inhaler for the symptoms. The treatment provided no relief. Her past medical history is significant for asthma.   Hypertension High here but coughing.  States BP is below 140/90 while at home but does not check it on a regular basis.    Relevant past medical, surgical, family and social history reviewed and updated as indicated. Interim medical history since our last visit reviewed. Allergies and medications reviewed and updated.  Review of Systems  Constitutional: Negative for chills.  HENT: Positive for postnasal drip. Negative for sore throat.   Respiratory: Positive for cough and shortness of breath.   Skin: Negative for rash.    Per HPI unless specifically indicated above     Objective:    BP (!) 152/87 (BP Location: Left Arm, Patient Position: Sitting, Cuff Size: Large)   Pulse 76   Temp 97.9 F (36.6 C)   Ht 5' (1.524 m)   Wt 145 lb (65.8 kg)   SpO2 96%   BMI 28.32 kg/m   Wt Readings from Last 3 Encounters:  09/30/16  145 lb (65.8 kg)  07/01/16 145 lb (65.8 kg)  06/03/16 147 lb (66.7 kg)    Physical Exam  Constitutional: She is oriented to person, place, and time. She appears well-developed and well-nourished.  HENT:  Head: Normocephalic and atraumatic.  Eyes: Pupils are equal, round, and reactive to light. Right eye exhibits no discharge. Left eye exhibits no discharge. No scleral icterus.  Neck: Normal range of motion. Neck supple. Carotid bruit is not present. No thyromegaly present.  Cardiovascular: Normal rate and normal heart sounds.  An irregular rhythm present. Exam reveals no gallop and no friction rub.   No murmur heard. EKG showing PACs  Pulmonary/Chest: Effort normal and breath sounds normal. No respiratory  distress. She has no wheezes. She has no rales.  Abdominal: Soft. Bowel sounds are normal. There is no tenderness. There is no rebound.  Genitourinary: No breast swelling, tenderness or discharge.  Musculoskeletal: Normal range of motion.  Lymphadenopathy:    She has no cervical adenopathy.  Neurological: She is alert and oriented to person, place, and time.  Skin: Skin is warm, dry and intact. No rash noted.  Psychiatric: She has a normal mood and affect. Her speech is normal and behavior is normal. Judgment and thought content normal. Cognition and memory are normal.    Results for orders placed or performed in visit on 06/18/15  CBC with Differential/Platelet  Result Value Ref Range   WBC 3.1 (L) 3.4 - 10.8 x10E3/uL   RBC 4.04 3.77 - 5.28 x10E6/uL   Hemoglobin 12.3 11.1 - 15.9 g/dL   Hematocrit 37.6 34.0 - 46.6 %   MCV 93 79 - 97 fL   MCH 30.4 26.6 - 33.0 pg   MCHC 32.7 31.5 - 35.7 g/dL   RDW 12.4 12.3 - 15.4 %   Platelets 172 150 - 379 x10E3/uL   Neutrophils 53 %   Lymphs 35 %   Monocytes 8 %   Eos 4 %   Basos 0 %   Neutrophils Absolute 1.7 1.4 - 7.0 x10E3/uL   Lymphocytes Absolute 1.1 0.7 - 3.1 x10E3/uL   Monocytes Absolute 0.2 0.1 - 0.9 x10E3/uL   EOS (ABSOLUTE) 0.1 0.0 - 0.4 x10E3/uL   Basophils Absolute 0.0 0.0 - 0.2 x10E3/uL   Immature Granulocytes 0 %   Immature Grans (Abs) 0.0 0.0 - 0.1 x10E3/uL  Vit D  25 hydroxy (rtn osteoporosis monitoring)  Result Value Ref Range   Vit D, 25-Hydroxy 31.2 30.0 - 100.0 ng/mL      Assessment & Plan:   Problem List Items Addressed This Visit      Unprioritized   Asthma   Relevant Medications   predniSONE (DELTASONE) 20 MG tablet   albuterol (PROAIR HFA) 108 (90 Base) MCG/ACT inhaler   Essential hypertension, benign    Recheck in 1 month.  Bring readings from home      Relevant Orders   Comprehensive metabolic panel   Lipid Panel w/o Chol/HDL Ratio   TSH   Major depression    Stable, continue present medications.         Moderate persistent asthma without complication   Relevant Medications   predniSONE (DELTASONE) 20 MG tablet   albuterol (PROAIR HFA) 108 (90 Base) MCG/ACT inhaler    Other Visit Diagnoses    Irregular heart rate    -  Primary   Relevant Orders   EKG 12-Lead (Completed)   Routine general medical examination at a health care facility  Follow up plan: Return in about 4 weeks (around 10/28/2016).  Refusing Mammogram and Cologuard

## 2016-10-01 LAB — COMPREHENSIVE METABOLIC PANEL
A/G RATIO: 1.6 (ref 1.2–2.2)
ALBUMIN: 4.1 g/dL (ref 3.5–4.8)
ALK PHOS: 84 IU/L (ref 39–117)
ALT: 7 IU/L (ref 0–32)
AST: 11 IU/L (ref 0–40)
BILIRUBIN TOTAL: 0.5 mg/dL (ref 0.0–1.2)
BUN / CREAT RATIO: 8 — AB (ref 12–28)
BUN: 7 mg/dL — ABNORMAL LOW (ref 8–27)
CO2: 27 mmol/L (ref 18–29)
CREATININE: 0.91 mg/dL (ref 0.57–1.00)
Calcium: 9.1 mg/dL (ref 8.7–10.3)
Chloride: 102 mmol/L (ref 96–106)
GFR calc Af Amer: 74 mL/min/{1.73_m2} (ref >59)
GFR calc non Af Amer: 64 mL/min/{1.73_m2} (ref >59)
GLOBULIN, TOTAL: 2.6 g/dL (ref 1.5–4.5)
Glucose: 79 mg/dL (ref 65–99)
POTASSIUM: 4 mmol/L (ref 3.5–5.2)
SODIUM: 141 mmol/L (ref 134–144)
Total Protein: 6.7 g/dL (ref 6.0–8.5)

## 2016-10-01 LAB — LIPID PANEL W/O CHOL/HDL RATIO
CHOLESTEROL TOTAL: 188 mg/dL (ref 100–199)
HDL: 64 mg/dL (ref >39)
LDL CALC: 113 mg/dL — AB (ref 0–99)
Triglycerides: 53 mg/dL (ref 0–149)
VLDL Cholesterol Cal: 11 mg/dL (ref 5–40)

## 2016-10-01 LAB — TSH: TSH: 2.47 u[IU]/mL (ref 0.450–4.500)

## 2016-10-03 ENCOUNTER — Encounter: Payer: Self-pay | Admitting: Unknown Physician Specialty

## 2016-10-03 NOTE — Progress Notes (Signed)
Notified pt by mychart

## 2016-10-25 ENCOUNTER — Encounter: Payer: Self-pay | Admitting: Unknown Physician Specialty

## 2016-10-25 ENCOUNTER — Ambulatory Visit (INDEPENDENT_AMBULATORY_CARE_PROVIDER_SITE_OTHER): Payer: Medicare Other | Admitting: Unknown Physician Specialty

## 2016-10-25 VITALS — BP 164/83 | HR 43 | Temp 98.5°F | Wt 146.0 lb

## 2016-10-25 DIAGNOSIS — I10 Essential (primary) hypertension: Secondary | ICD-10-CM | POA: Diagnosis not present

## 2016-10-25 DIAGNOSIS — B029 Zoster without complications: Secondary | ICD-10-CM | POA: Diagnosis not present

## 2016-10-25 DIAGNOSIS — Z23 Encounter for immunization: Secondary | ICD-10-CM

## 2016-10-25 DIAGNOSIS — L0100 Impetigo, unspecified: Secondary | ICD-10-CM

## 2016-10-25 MED ORDER — MUPIROCIN 2 % EX OINT: 1.0000 "application " | TOPICAL_OINTMENT | Freq: Two times a day (BID) | CUTANEOUS | 0 refills | Status: DC

## 2016-10-25 MED ORDER — HYDROCHLOROTHIAZIDE 25 MG PO TABS: 25.0000 mg | ORAL_TABLET | Freq: Every day | ORAL | 1 refills | Status: DC

## 2016-10-25 MED ORDER — VALACYCLOVIR HCL 1 G PO TABS: 1000.0000 mg | ORAL_TABLET | Freq: Three times a day (TID) | ORAL | 0 refills | Status: DC

## 2016-10-25 NOTE — Assessment & Plan Note (Signed)
BP is under poor control.  Will start HCTZ

## 2016-10-25 NOTE — Progress Notes (Signed)
BP (!) 164/83 (BP Location: Left Arm, Patient Position: Sitting, Cuff Size: Normal)   Pulse (!) 43   Temp 98.5 F (36.9 C)   Wt 146 lb (66.2 kg)   SpO2 96%   BMI 28.51 kg/m    Subjective:    Patient ID: Shelly Bridges, female    DOB: 1946-10-29, 70 y.o.   MRN: [id number]  HPI: Shelly Bridges is a 70 y.o. female  Chief Complaint  Patient presents with  . Rash    pt states she has a rash on right side of her nose that started 2 weeks ago, and now has it on her neck. States it itches very bad.    Pt with rash right side of nose.  States there is a scaly area right labial fold.    States she started with a rash on the right side of neck starting yesterday.  States it itches.    Hypertension Using medications without difficulty Average home BPs125-165/70's-  No problems or lightheadedness No chest pain with exertion or shortness of breath No Edema   Relevant past medical, surgical, family and social history reviewed and updated as indicated. Interim medical history since our last visit reviewed. Allergies and medications reviewed and updated.  Review of Systems  Per HPI unless specifically indicated above     Objective:    BP (!) 164/83 (BP Location: Left Arm, Patient Position: Sitting, Cuff Size: Normal)   Pulse (!) 43   Temp 98.5 F (36.9 C)   Wt 146 lb (66.2 kg)   SpO2 96%   BMI 28.51 kg/m   Wt Readings from Last 3 Encounters:  10/25/16 146 lb (66.2 kg)  09/30/16 145 lb (65.8 kg)  07/01/16 145 lb (65.8 kg)    Physical Exam  Constitutional: She is oriented to person, place, and time. She appears well-developed and well-nourished. No distress.  HENT:  Head: Normocephalic and atraumatic.  Eyes: Conjunctivae and lids are normal. Right eye exhibits no discharge. Left eye exhibits no discharge. No scleral icterus.  Cardiovascular: Normal rate.   Pulmonary/Chest: Effort normal.  Abdominal: Normal appearance. There is no splenomegaly or  hepatomegaly.  Musculoskeletal: Normal range of motion.  Neurological: She is alert and oriented to person, place, and time.  Skin: Skin is intact. No rash noted. No pallor.  Scaley lesion right labial fold  Right side of neck erythemetous and vescicular lesions.    Psychiatric: She has a normal mood and affect. Her behavior is normal. Judgment and thought content normal.    Results for orders placed or performed in visit on 09/30/16  Comprehensive metabolic panel  Result Value Ref Range   Glucose 79 65 - 99 mg/dL   BUN 7 (L) 8 - 27 mg/dL   Creatinine, Ser 0.91 0.57 - 1.00 mg/dL   GFR calc non Af Amer 64 >59 mL/min/1.73   GFR calc Af Amer 74 >59 mL/min/1.73   BUN/Creatinine Ratio 8 (L) 12 - 28   Sodium 141 134 - 144 mmol/L   Potassium 4.0 3.5 - 5.2 mmol/L   Chloride 102 96 - 106 mmol/L   CO2 27 18 - 29 mmol/L   Calcium 9.1 8.7 - 10.3 mg/dL   Total Protein 6.7 6.0 - 8.5 g/dL   Albumin 4.1 3.5 - 4.8 g/dL   Globulin, Total 2.6 1.5 - 4.5 g/dL   Albumin/Globulin Ratio 1.6 1.2 - 2.2   Bilirubin Total 0.5 0.0 - 1.2 mg/dL   Alkaline Phosphatase 84 39 - 117  IU/L   AST 11 0 - 40 IU/L   ALT 7 0 - 32 IU/L  Lipid Panel w/o Chol/HDL Ratio  Result Value Ref Range   Cholesterol, Total 188 100 - 199 mg/dL   Triglycerides 53 0 - 149 mg/dL   HDL 64 >39 mg/dL   VLDL Cholesterol Cal 11 5 - 40 mg/dL   LDL Calculated 113 (H) 0 - 99 mg/dL  TSH  Result Value Ref Range   TSH 2.470 0.450 - 4.500 uIU/mL      Assessment & Plan:   Problem List Items Addressed This Visit      Unprioritized   Essential hypertension, benign    BP is under poor control.  Will start HCTZ      Relevant Medications   hydrochlorothiazide (HYDRODIURIL) 25 MG tablet    Other Visit Diagnoses    Need for influenza vaccination    -  Primary   Relevant Orders   Flu vaccine HIGH DOSE PF (Completed)   Herpes zoster without complication       Take Valtrex TID for 7 days   Relevant Medications   mupirocin ointment  (BACTROBAN) 2 %   valACYclovir (VALTREX) 1000 MG tablet   Impetigo       Rx for Bactroban cream   Relevant Medications   mupirocin ointment (BACTROBAN) 2 %   valACYclovir (VALTREX) 1000 MG tablet       Follow up plan: Return in about 4 weeks (around 11/22/2016).

## 2016-10-25 NOTE — Patient Instructions (Addendum)

## 2016-10-26 ENCOUNTER — Ambulatory Visit: Payer: Medicare Other | Admitting: Unknown Physician Specialty

## 2016-10-28 ENCOUNTER — Telehealth: Payer: Self-pay | Admitting: Family Medicine

## 2016-10-28 NOTE — Telephone Encounter (Signed)
Patient states that she is feeling better than she has, she will try the Dramine to see if helps, if it doesn't she will go to Urgent Care tomorrow.

## 2016-10-28 NOTE — Telephone Encounter (Signed)
Can you check and find out what she would like to take? Thanks!

## 2016-10-28 NOTE — Telephone Encounter (Signed)
Pt called has a question about a medication she would like to take. Please call pt ASAP. Thanks.

## 2016-10-28 NOTE — Telephone Encounter (Signed)
Patient is taking the Valtrex for shingles, she has medication for her blood pressure, that she has not taken because she took it Tuesday, and now she has been really sick. She has not ate or been out of bed. She would like to know if she can Dramine and ibuprofen for the headache and dizziness.

## 2016-10-28 NOTE — Telephone Encounter (Signed)
She can take dramamine, but if she has been that sick, she really needs to be seen.

## 2016-12-09 ENCOUNTER — Ambulatory Visit (INDEPENDENT_AMBULATORY_CARE_PROVIDER_SITE_OTHER): Payer: Medicare Other | Admitting: Unknown Physician Specialty

## 2016-12-09 ENCOUNTER — Encounter: Payer: Self-pay | Admitting: Unknown Physician Specialty

## 2016-12-09 DIAGNOSIS — I10 Essential (primary) hypertension: Secondary | ICD-10-CM | POA: Diagnosis not present

## 2016-12-09 MED ORDER — HYDROCHLOROTHIAZIDE 25 MG PO TABS: 25.0000 mg | ORAL_TABLET | Freq: Every day | ORAL | 1 refills | Status: DC

## 2016-12-09 NOTE — Progress Notes (Signed)
BP 135/78 (BP Location: Left Arm, Patient Position: Sitting, Cuff Size: Normal)   Pulse 70   Temp 97.5 F (36.4 C)   Ht 5' 0.3" (1.532 m)   Wt 145 lb 12.8 oz (66.1 kg)   SpO2 97%   BMI 28.19 kg/m    Subjective:    Patient ID: Shelly Bridges, female    DOB: 10-Nov-1946, 71 y.o.   MRN: [id number]  HPI: Shelly Bridges is a 71 y.o. female  Chief Complaint  Patient presents with  . Hypertension    follow up from Novmeber visit, started HCTZ last time and patient states she can tell a difference since starting medication   Hypertension Using medications without difficulty Average home BPs Not checking   No problems or lightheadedness No chest pain with exertion or shortness of breath No Edema  Relevant past medical, surgical, family and social history reviewed and updated as indicated. Interim medical history since our last visit reviewed. Allergies and medications reviewed and updated.  Review of Systems  Per HPI unless specifically indicated above     Objective:    BP 135/78 (BP Location: Left Arm, Patient Position: Sitting, Cuff Size: Normal)   Pulse 70   Temp 97.5 F (36.4 C)   Ht 5' 0.3" (1.532 m)   Wt 145 lb 12.8 oz (66.1 kg)   SpO2 97%   BMI 28.19 kg/m   Wt Readings from Last 3 Encounters:  12/09/16 145 lb 12.8 oz (66.1 kg)  10/25/16 146 lb (66.2 kg)  09/30/16 145 lb (65.8 kg)    Physical Exam  Constitutional: She is oriented to person, place, and time. She appears well-developed and well-nourished. No distress.  HENT:  Head: Normocephalic and atraumatic.  Eyes: Conjunctivae and lids are normal. Right eye exhibits no discharge. Left eye exhibits no discharge. No scleral icterus.  Neck: Normal range of motion. Neck supple. No JVD present. Carotid bruit is not present.  Cardiovascular: Normal rate, regular rhythm and normal heart sounds.   Pulmonary/Chest: Effort normal and breath sounds normal.  Abdominal: Normal appearance. There is no  splenomegaly or hepatomegaly.  Musculoskeletal: Normal range of motion.  Neurological: She is alert and oriented to person, place, and time.  Skin: Skin is warm, dry and intact. No rash noted. No pallor.  Psychiatric: She has a normal mood and affect. Her behavior is normal. Judgment and thought content normal.    Results for orders placed or performed in visit on 09/30/16  Comprehensive metabolic panel  Result Value Ref Range   Glucose 79 65 - 99 mg/dL   BUN 7 (L) 8 - 27 mg/dL   Creatinine, Ser 0.91 0.57 - 1.00 mg/dL   GFR calc non Af Amer 64 >59 mL/min/1.73   GFR calc Af Amer 74 >59 mL/min/1.73   BUN/Creatinine Ratio 8 (L) 12 - 28   Sodium 141 134 - 144 mmol/L   Potassium 4.0 3.5 - 5.2 mmol/L   Chloride 102 96 - 106 mmol/L   CO2 27 18 - 29 mmol/L   Calcium 9.1 8.7 - 10.3 mg/dL   Total Protein 6.7 6.0 - 8.5 g/dL   Albumin 4.1 3.5 - 4.8 g/dL   Globulin, Total 2.6 1.5 - 4.5 g/dL   Albumin/Globulin Ratio 1.6 1.2 - 2.2   Bilirubin Total 0.5 0.0 - 1.2 mg/dL   Alkaline Phosphatase 84 39 - 117 IU/L   AST 11 0 - 40 IU/L   ALT 7 0 - 32 IU/L  Lipid Panel w/o  Chol/HDL Ratio  Result Value Ref Range   Cholesterol, Total 188 100 - 199 mg/dL   Triglycerides 53 0 - 149 mg/dL   HDL 64 >39 mg/dL   VLDL Cholesterol Cal 11 5 - 40 mg/dL   LDL Calculated 113 (H) 0 - 99 mg/dL  TSH  Result Value Ref Range   TSH 2.470 0.450 - 4.500 uIU/mL      Assessment & Plan:   Problem List Items Addressed This Visit      Unprioritized   Essential hypertension, benign    Stable, continue present medications.        Relevant Medications   hydrochlorothiazide (HYDRODIURIL) 25 MG tablet       Follow up plan: Return in about 6 months (around 06/08/2017).

## 2016-12-09 NOTE — Assessment & Plan Note (Signed)
Stable, continue present medications.   

## 2016-12-15 ENCOUNTER — Telehealth: Payer: Self-pay | Admitting: Unknown Physician Specialty

## 2016-12-15 MED ORDER — PREDNISONE 20 MG PO TABS: 40.0000 mg | ORAL_TABLET | Freq: Every day | ORAL | 0 refills | Status: DC

## 2016-12-15 NOTE — Telephone Encounter (Signed)
Routing to provider  

## 2016-12-15 NOTE — Telephone Encounter (Signed)
Prednisone sent

## 2016-12-15 NOTE — Telephone Encounter (Signed)
Patient states that she is still experiencing cough symptoms and feels that she would benefit from Prednisone.  Please call the RX in to [facility] on [address] in [place].

## 2016-12-31 ENCOUNTER — Encounter: Payer: Self-pay | Admitting: Emergency Medicine

## 2016-12-31 ENCOUNTER — Ambulatory Visit
Admission: EM | Admit: 2016-12-31 | Discharge: 2016-12-31 | Disposition: A | Discharge status: Home / Self Care | Payer: Medicare Other | Attending: Family Medicine | Admitting: Family Medicine

## 2016-12-31 DIAGNOSIS — J45909 Unspecified asthma, uncomplicated: Secondary | ICD-10-CM | POA: Diagnosis not present

## 2016-12-31 DIAGNOSIS — J4521 Mild intermittent asthma with (acute) exacerbation: Secondary | ICD-10-CM | POA: Diagnosis not present

## 2016-12-31 MED ORDER — HYDROCOD POLST-CPM POLST ER 10-8 MG/5ML PO SUER: 5.0000 mL | Freq: Two times a day (BID) | ORAL | 0 refills | Status: DC | PRN

## 2016-12-31 MED ORDER — PREDNISONE 20 MG PO TABS
ORAL_TABLET | ORAL | 0 refills | Status: DC
Start: 2016-12-31 — End: 2017-02-01

## 2016-12-31 NOTE — ED Triage Notes (Signed)
Patient treated for shingles couple of weeks ago.  Patient c/o cough that started 2 weeks ago.  Patient denies fever.

## 2016-12-31 NOTE — ED Provider Notes (Signed)
[facility]    CSN: [id number] Arrival date & time: 12/31/16  1009     History   Chief Complaint Chief Complaint  Patient presents with  . Cough    HPI Shelly Bridges is a 71 y.o. female.   The history is provided by the patient.  Cough  Associated symptoms: rhinorrhea and wheezing   Associated symptoms: no headaches   URI  Presenting symptoms: congestion, cough and rhinorrhea   Severity:  Moderate Onset quality:  Sudden Duration:  2 weeks Timing:  Constant Progression:  Unchanged Chronicity:  New Relieved by:  Prescription medications (inhalers) Associated symptoms: wheezing   Associated symptoms: no headaches and no sinus pain   Risk factors: being elderly, chronic respiratory disease (asthma), recent travel and sick contacts   Risk factors: no chronic cardiac disease, no chronic kidney disease, no diabetes mellitus, no immunosuppression and no recent illness     Past Medical History:  Diagnosis Date  . Anxiety   . Asthma   . Lipoma   . Neutropenia ([place])   . Osteoporosis   . Vitamin D deficiency disease     Patient Active Problem List   Diagnosis Date Noted  . Moderate persistent asthma without complication 99991111  . Major depression 06/03/2016  . Need for 23-polyvalent pneumococcal polysaccharide vaccine 11/01/2015  . Needs flu shot 11/01/2015  . Essential hypertension, benign 07/28/2015  . Anxiety   . Osteoporosis   . Vitamin D deficiency disease   . Asthma   . Neutropenia ([place])   . Lipoma     History reviewed. No pertinent surgical history.  OB History    No data available       Home Medications    Prior to Admission medications   Medication Sig Start Date End Date Taking? Authorizing Provider  albuterol (PROAIR HFA) 108 (90 Base) MCG/ACT inhaler Inhale 2 puffs into the lungs every 4 (four) hours as needed for wheezing or shortness of breath. 09/30/16   Kathrine Haddock, NP  chlorpheniramine-HYDROcodone (TUSSIONEX  PENNKINETIC ER) 10-8 MG/5ML SUER Take 5 mLs by mouth every 12 (twelve) hours as needed. 12/31/16   Norval Gable, MD  fluticasone (FLOVENT HFA) 110 MCG/ACT inhaler Inhale 2 puffs into the lungs 2 (two) times daily. 06/03/16   Kathrine Haddock, NP  hydrochlorothiazide (HYDRODIURIL) 25 MG tablet Take 1 tablet (25 mg total) by mouth daily. 12/09/16   Kathrine Haddock, NP  predniSONE (DELTASONE) 20 MG tablet 3 tabs po once day 1, then 2 tabs po qd for 2 days, then 1 tab po qd for 2 days, then half a tab po qd for 2 days 12/31/16   Norval Gable, MD  sertraline (ZOLOFT) 50 MG tablet Take 1 tablet (50 mg total) by mouth daily. 07/01/16   Kathrine Haddock, NP    Family History Family History  Problem Relation Age of Onset  . Dementia Mother   . Hypertension Mother   . Osteoporosis Mother   . Stroke Mother     mini stroke  . Cancer Father     brain tumor and lung  . Hypertension Father   . Emphysema Father   . COPD Neg Hx   . Heart disease Neg Hx     Social History Social History  Substance Use Topics  . Smoking status: Former Smoker    Packs/day: 1.00    Years: 20.00    Types: Cigarettes    Quit date: 06/17/1985  . Smokeless tobacco: Never Used  . Alcohol use No  Allergies   Patient has no known allergies.   Review of Systems Review of Systems  HENT: Positive for congestion and rhinorrhea. Negative for sinus pain.   Respiratory: Positive for cough and wheezing.   Neurological: Negative for headaches.     Physical Exam Triage Vital Signs ED Triage Vitals  Enc Vitals Group     BP 12/31/16 1027 122/69     Pulse Rate 12/31/16 1027 (!) 58     Resp 12/31/16 1027 17     Temp 12/31/16 1027 99.6 F (37.6 C)     Temp Source 12/31/16 1027 Oral     SpO2 12/31/16 1027 99 %     Weight 12/31/16 1026 145 lb (65.8 kg)     Height 12/31/16 1026 5' (1.524 m)     Head Circumference --      Peak Flow --      Pain Score 12/31/16 1028 0     Pain Loc --      Pain Edu? --      Excl. in [place]? --     No data found.   Updated Vital Signs BP 122/69 (BP Location: Right Arm)   Pulse (!) 58   Temp 99.6 F (37.6 C) (Oral)   Resp 17   Ht 5' (1.524 m)   Wt 145 lb (65.8 kg)   SpO2 99%   BMI 28.32 kg/m   Visual Acuity Right Eye Distance: 20/30 corrected Left Eye Distance: 20/25 corrected Bilateral Distance: 20/25 corrected  Right Eye Near:   Left Eye Near:    Bilateral Near:     Physical Exam  Constitutional: She appears well-developed and well-nourished. No distress.  HENT:  Head: Normocephalic and atraumatic.  Right Ear: Tympanic membrane, external ear and ear canal normal.  Left Ear: Tympanic membrane, external ear and ear canal normal.  Nose: Rhinorrhea present. No mucosal edema, nose lacerations, sinus tenderness, nasal deformity, septal deviation or nasal septal hematoma. No epistaxis.  No foreign bodies. Right sinus exhibits no maxillary sinus tenderness and no frontal sinus tenderness. Left sinus exhibits no maxillary sinus tenderness and no frontal sinus tenderness.  Mouth/Throat: Uvula is midline, oropharynx is clear and moist and mucous membranes are normal. No oropharyngeal exudate.  Eyes: Conjunctivae and EOM are normal. Pupils are equal, round, and reactive to light. Right eye exhibits no discharge. Left eye exhibits no discharge. No scleral icterus.  Neck: Normal range of motion. Neck supple. No thyromegaly present.  Cardiovascular: Normal rate, regular rhythm and normal heart sounds.   Pulmonary/Chest: Effort normal and breath sounds normal. No respiratory distress. She has no wheezes. She has no rales.  Lymphadenopathy:    She has no cervical adenopathy.  Skin: She is not diaphoretic.  Nursing note and vitals reviewed.    UC Treatments / Results  Labs (all labs ordered are listed, but only abnormal results are displayed) Labs Reviewed - No data to display  EKG  EKG Interpretation None       Radiology No results found.  Procedures Procedures  (including critical care time)  Medications Ordered in UC Medications - No data to display   Initial Impression / Assessment and Plan / UC Course  I have reviewed the triage vital signs and the nursing notes.  Pertinent labs & imaging results that were available during my care of the patient were reviewed by me and considered in my medical decision making (see chart for details).       Final Clinical Impressions(s) / UC Diagnoses  Final diagnoses:  Mild intermittent asthma with exacerbation  Asthma due to environmental allergies    New Prescriptions Discharge Medication List as of 12/31/2016 11:31 AM    START taking these medications   Details  chlorpheniramine-HYDROcodone (TUSSIONEX PENNKINETIC ER) 10-8 MG/5ML SUER Take 5 mLs by mouth every 12 (twelve) hours as needed., Starting Sat 12/31/2016, Normal    predniSONE (DELTASONE) 20 MG tablet 3 tabs po once day 1, then 2 tabs po qd for 2 days, then 1 tab po qd for 2 days, then half a tab po qd for 2 days, Normal       1. diagnosis reviewed with patient 2. rx as per orders above; reviewed possible side effects, interactions, risks and benefits  3. Recommend supportive treatment with increased fluids 4. Follow-up prn if symptoms worsen or don't improve   Norval Gable, MD 12/31/16 1357

## 2017-01-30 ENCOUNTER — Telehealth: Payer: Self-pay | Admitting: Unknown Physician Specialty

## 2017-01-30 NOTE — Telephone Encounter (Signed)
Yes she does.  Thanks.  Just FYI, I always ask for a visit for prednisone and tussionex

## 2017-01-30 NOTE — Telephone Encounter (Signed)
Routing to provider. Does patient need to be seen for all of this?

## 2017-01-30 NOTE — Telephone Encounter (Signed)
Patient needs a visit with Malachy Mood for these medications. Please schedule.

## 2017-01-30 NOTE — Telephone Encounter (Signed)
Patient called to see if Shelly Bridges would call her in some prednisone and also would like a new prescription for her tussionex cough syrup.  She is having difficulty with her breathing. She said this is a normal reaction to the weather  Thanks   [place] [phone]

## 2017-02-01 ENCOUNTER — Ambulatory Visit
Admission: RE | Admit: 2017-02-01 | Discharge: 2017-02-01 | Disposition: A | Discharge status: Home / Self Care | Payer: Medicare Other | Source: Ambulatory Visit | Attending: Unknown Physician Specialty | Admitting: Unknown Physician Specialty

## 2017-02-01 ENCOUNTER — Encounter: Payer: Self-pay | Admitting: Unknown Physician Specialty

## 2017-02-01 ENCOUNTER — Ambulatory Visit (INDEPENDENT_AMBULATORY_CARE_PROVIDER_SITE_OTHER): Payer: Medicare Other | Admitting: Unknown Physician Specialty

## 2017-02-01 VITALS — BP 128/86 | HR 83 | Temp 98.8°F | Wt 143.6 lb

## 2017-02-01 DIAGNOSIS — J4540 Moderate persistent asthma, uncomplicated: Secondary | ICD-10-CM

## 2017-02-01 DIAGNOSIS — J449 Chronic obstructive pulmonary disease, unspecified: Secondary | ICD-10-CM | POA: Diagnosis not present

## 2017-02-01 DIAGNOSIS — J42 Unspecified chronic bronchitis: Secondary | ICD-10-CM | POA: Diagnosis not present

## 2017-02-01 DIAGNOSIS — I700 Atherosclerosis of aorta: Secondary | ICD-10-CM | POA: Insufficient documentation

## 2017-02-01 DIAGNOSIS — R05 Cough: Secondary | ICD-10-CM | POA: Diagnosis present

## 2017-02-01 DIAGNOSIS — J209 Acute bronchitis, unspecified: Secondary | ICD-10-CM | POA: Diagnosis not present

## 2017-02-01 MED ORDER — PREDNISONE 20 MG PO TABS: 40.0000 mg | ORAL_TABLET | Freq: Every day | ORAL | 0 refills | Status: DC

## 2017-02-01 MED ORDER — HYDROCOD POLST-CPM POLST ER 10-8 MG/5ML PO SUER: 5.0000 mL | Freq: Two times a day (BID) | ORAL | 0 refills | Status: DC | PRN

## 2017-02-01 MED ORDER — DOXYCYCLINE HYCLATE 100 MG PO TABS: 100.0000 mg | ORAL_TABLET | Freq: Two times a day (BID) | ORAL | 0 refills | Status: DC

## 2017-02-01 MED ORDER — MOMETASONE FURO-FORMOTEROL FUM 100-5 MCG/ACT IN AERO: 2.0000 | INHALATION_SPRAY | Freq: Two times a day (BID) | RESPIRATORY_TRACT | 0 refills | Status: DC

## 2017-02-01 NOTE — Progress Notes (Signed)
   BP 128/86 (BP Location: Left Arm, Patient Position: Sitting, Cuff Size: Large)   Pulse 83   Temp 98.8 F (37.1 C)   Wt 143 lb 9.6 oz (65.1 kg)   SpO2 94%   BMI 28.04 kg/m    Subjective:    Patient ID: Shelly Bridges, female    DOB: Jul 06, 1946, 71 y.o.   MRN: [id number]  HPI: Shelly Bridges is a 71 y.o. female  Chief Complaint  Patient presents with  . URI    pt states she is feeling like she did last month, states she has been traveling and thinks she may have not gotten rid of what she had the first time. Pt states she has a little bit of tussinex left that she had from before that she took and has also tried delsym. Stated that the delsym did not help at all.     Relevant past medical, surgical, family and social history reviewed and updated as indicated. Interim medical history since our last visit reviewed. Allergies and medications reviewed and updated.  Review of Systems  Per HPI unless specifically indicated above     Objective:    BP 128/86 (BP Location: Left Arm, Patient Position: Sitting, Cuff Size: Large)   Pulse 83   Temp 98.8 F (37.1 C)   Wt 143 lb 9.6 oz (65.1 kg)   SpO2 94%   BMI 28.04 kg/m   Wt Readings from Last 3 Encounters:  02/01/17 143 lb 9.6 oz (65.1 kg)  12/31/16 145 lb (65.8 kg)  12/09/16 145 lb 12.8 oz (66.1 kg)    Physical Exam  Results for orders placed or performed in visit on 09/30/16  Comprehensive metabolic panel  Result Value Ref Range   Glucose 79 65 - 99 mg/dL   BUN 7 (L) 8 - 27 mg/dL   Creatinine, Ser 0.91 0.57 - 1.00 mg/dL   GFR calc non Af Amer 64 >59 mL/min/1.73   GFR calc Af Amer 74 >59 mL/min/1.73   BUN/Creatinine Ratio 8 (L) 12 - 28   Sodium 141 134 - 144 mmol/L   Potassium 4.0 3.5 - 5.2 mmol/L   Chloride 102 96 - 106 mmol/L   CO2 27 18 - 29 mmol/L   Calcium 9.1 8.7 - 10.3 mg/dL   Total Protein 6.7 6.0 - 8.5 g/dL   Albumin 4.1 3.5 - 4.8 g/dL   Globulin, Total 2.6 1.5 - 4.5 g/dL   Albumin/Globulin Ratio 1.6 1.2 - 2.2   Bilirubin Total 0.5 0.0 - 1.2 mg/dL   Alkaline Phosphatase 84 39 - 117 IU/L   AST 11 0 - 40 IU/L   ALT 7 0 - 32 IU/L  Lipid Panel w/o Chol/HDL Ratio  Result Value Ref Range   Cholesterol, Total 188 100 - 199 mg/dL   Triglycerides 53 0 - 149 mg/dL   HDL 64 >39 mg/dL   VLDL Cholesterol Cal 11 5 - 40 mg/dL   LDL Calculated 113 (H) 0 - 99 mg/dL  TSH  Result Value Ref Range   TSH 2.470 0.450 - 4.500 uIU/mL      Assessment & Plan:   Problem List Items Addressed This Visit    None       Follow up plan: No Follow-up on file.

## 2017-02-01 NOTE — Progress Notes (Signed)
BP 128/86 (BP Location: Left Arm, Patient Position: Sitting, Cuff Size: Large)   Pulse 83   Temp 98.8 F (37.1 C)   Wt 143 lb 9.6 oz (65.1 kg)   SpO2 94%   BMI 28.04 kg/m    Subjective:    Patient ID: Shelly Bridges, female    DOB: 10-16-1946, 71 y.o.   MRN: [id number]  HPI: Shelly Bridges is a 71 y.o. female  Chief Complaint  Patient presents with  . URI    pt states she is feeling like she did last month, states she has been traveling and thinks she may have not gotten rid of what she had the first time. Pt states she has a little bit of tussinex left that she had from before that she took and has also tried delsym. Stated that the delsym did not help at all.    Cough and Asthma Exacerbation Patient with a history of asthma on Albuterol presents to clinic with a productive green cough and congestion x 2 months.  She states she recently returned from travel that made it worse and it worsens when the weather changes. Associated symptoms include shortness of breath and chest pain from coughing. Denies rhinorrhea, sore throat, itching eyes, ear pain, fever/chills, headache, body aches. She has tried 2-3 courses of Prednisone, albuterol, and Delsym without relief. She was seen in Urgent Care on 12/31/16 and was given Tussionex with relief, but she is almost out of it. Denies sick contacts. Former smoker.   Relevant past medical, surgical, family and social history reviewed and updated as indicated. Interim medical history since our last visit reviewed. Allergies and medications reviewed and updated.  Review of Systems  Constitutional: Negative for chills, fatigue and fever.  HENT: Positive for congestion. Negative for ear discharge, ear pain, rhinorrhea, sinus pain, sinus pressure, sneezing, sore throat and trouble swallowing.   Eyes: Negative.   Respiratory: Positive for cough, chest tightness, shortness of breath and wheezing. Negative for stridor.   Cardiovascular:  Negative for chest pain and palpitations.  Gastrointestinal: Negative for abdominal pain, constipation, diarrhea, nausea and vomiting.  Skin: Negative for rash.  Neurological: Negative for weakness and headaches.    Per HPI unless specifically indicated above     Objective:    BP 128/86 (BP Location: Left Arm, Patient Position: Sitting, Cuff Size: Large)   Pulse 83   Temp 98.8 F (37.1 C)   Wt 143 lb 9.6 oz (65.1 kg)   SpO2 94%   BMI 28.04 kg/m   Wt Readings from Last 3 Encounters:  02/01/17 143 lb 9.6 oz (65.1 kg)  12/31/16 145 lb (65.8 kg)  12/09/16 145 lb 12.8 oz (66.1 kg)    Physical Exam  Constitutional: She is oriented to person, place, and time. She appears well-developed and well-nourished. No distress.  HENT:  Head: Normocephalic and atraumatic.  Eyes: Conjunctivae are normal. Right eye exhibits no discharge. Left eye exhibits no discharge. No scleral icterus.  Neck: Normal range of motion. Neck supple. No JVD present.  Cardiovascular: Normal rate, regular rhythm, normal heart sounds and intact distal pulses.   Pulmonary/Chest: Effort normal. No respiratory distress. She has no rales.  Diffuse expiratory wheezes across all lung fields and crackles over bilateral lung bases. No accessory muscle use. Patient is able to speak in full sentences without distress.   Abdominal: Soft. Bowel sounds are normal.  Musculoskeletal: Normal range of motion.  Neurological: She is alert and oriented to person, place, and time.  Skin: Skin is warm and dry.  Psychiatric: She has a normal mood and affect. Her behavior is normal. Judgment and thought content normal.       Assessment & Plan:   Problem List Items Addressed This Visit    Asthma    Spirometry done in office with marked improvement of FVC 48% to 66% after nebulizer treatment. Added Dulera to her asthma regimen. Prescribed Prednisone and Tussionex for symptomatic relief, and advised to use albuterol often. She will have  chest XR done outpatient. Patient counseled on going to the ED if shortness of breath worsens or signs of respiratory distress occurs.       Relevant Medications   doxycycline (VIBRA-TABS) 100 MG tablet   chlorpheniramine-HYDROcodone (TUSSIONEX PENNKINETIC ER) 10-8 MG/5ML SUER   predniSONE (DELTASONE) 20 MG tablet   mometasone-formoterol (DULERA) 100-5 MCG/ACT AERO   Other Relevant Orders   Spirometry with Graph (Completed)   DG Chest 2 View    Other Visit Diagnoses    Acute exacerbation of chronic bronchitis (HCC)    -  Primary   Doxycycline prescribed.    Relevant Medications   chlorpheniramine-HYDROcodone (TUSSIONEX PENNKINETIC ER) 10-8 MG/5ML SUER   predniSONE (DELTASONE) 20 MG tablet   mometasone-formoterol (DULERA) 100-5 MCG/ACT AERO       Follow up plan: Return in about 4 weeks (around 03/01/2017), or if symptoms worsen or fail to improve, for asthma management reevaluation.Marland Kitchen

## 2017-02-01 NOTE — Assessment & Plan Note (Deleted)
Add Dulera to her asthma regimen. Take Prednisone and Tussionex and use albuterol often for breathing relief. Patient counseled on going to the ED if shortness of breath worsens or signs of respiratory distress occurs.

## 2017-02-01 NOTE — Assessment & Plan Note (Addendum)
Spirometry done in office with marked improvement of FVC 48% to 66% after nebulizer treatment. Added Dulera to her asthma regimen. Prescribed Prednisone and Tussionex for symptomatic relief, and advised to use albuterol often. She will have chest XR done outpatient. Patient counseled on going to the ED if shortness of breath worsens or signs of respiratory distress occurs.

## 2017-02-02 ENCOUNTER — Telehealth: Payer: Self-pay | Admitting: Unknown Physician Specialty

## 2017-02-02 NOTE — Telephone Encounter (Signed)
Yes, Shelly Bridges is coming in sometime this morning.

## 2017-02-02 NOTE — Telephone Encounter (Signed)
Patient was in yesterday Shelly Bridges wrote a script for her cough syrup which was not signed. Do you think Shelly Bridges would take care of this when she comes in today for her meeting.  Thanks   [phone]

## 2017-02-02 NOTE — Telephone Encounter (Signed)
Patient notified

## 2017-02-27 ENCOUNTER — Encounter: Payer: Self-pay | Admitting: Unknown Physician Specialty

## 2017-02-27 ENCOUNTER — Ambulatory Visit (INDEPENDENT_AMBULATORY_CARE_PROVIDER_SITE_OTHER): Payer: Medicare Other | Admitting: Unknown Physician Specialty

## 2017-02-27 DIAGNOSIS — F332 Major depressive disorder, recurrent severe without psychotic features: Secondary | ICD-10-CM

## 2017-02-27 DIAGNOSIS — J4540 Moderate persistent asthma, uncomplicated: Secondary | ICD-10-CM

## 2017-02-27 NOTE — Assessment & Plan Note (Signed)
Will continue her previousky effective treatment with Flovent and Proair as needed

## 2017-02-27 NOTE — Patient Instructions (Addendum)
For rentals: [employer] VRBO

## 2017-02-27 NOTE — Progress Notes (Signed)
BP 135/80 (BP Location: Left Arm, Patient Position: Sitting, Cuff Size: Normal)   Pulse 84   Temp 98.4 F (36.9 C)   Wt 142 lb 9.6 oz (64.7 kg)   SpO2 97%   BMI 27.85 kg/m    Subjective:    Patient ID: Shelly Bridges, female    DOB: Apr 23, 1946, 71 y.o.   MRN: [id number]  HPI: Shelly Bridges is a 71 y.o. female  Chief Complaint  Patient presents with  . Bronchitis    4 week f/up, patient states she feels a lot better   ASTHMA/COPD F/u for previous week of AECB.  Pt completed her Ruthe Mannan and is now back on her Flovent.  She feels she is becoming more active.   Triggers: Change in the weather.   Symptoms are well controlled: Night time symptoms: Wheeze/SOB: none, but taking Proair 2-3 times/day Allergens: environmental  Depression Feels she is doing well with present treatment Depression screen [facility] 2/9 02/27/2017 09/30/2016 07/01/2016 06/03/2016 06/18/2015  Decreased Interest 1 1 0 3 0  Down, Depressed, Hopeless 1 1 0 3 0  PHQ - 2 Score 2 2 0 6 0  Altered sleeping - - - 3 -  Tired, decreased energy - - - 3 -  Change in appetite - - - 2 -  Feeling bad or failure about yourself  - - - 1 -  Trouble concentrating - - - 1 -  Moving slowly or fidgety/restless - - - 2 -  Suicidal thoughts - - - 0 -  PHQ-9 Score - - - 18 -    Relevant past medical, surgical, family and social history reviewed and updated as indicated. Interim medical history since our last visit reviewed. Allergies and medications reviewed and updated.  Review of Systems  Per HPI unless specifically indicated above     Objective:    BP 135/80 (BP Location: Left Arm, Patient Position: Sitting, Cuff Size: Normal)   Pulse 84   Temp 98.4 F (36.9 C)   Wt 142 lb 9.6 oz (64.7 kg)   SpO2 97%   BMI 27.85 kg/m   Wt Readings from Last 3 Encounters:  02/27/17 142 lb 9.6 oz (64.7 kg)  02/01/17 143 lb 9.6 oz (65.1 kg)  12/31/16 145 lb (65.8 kg)    Physical Exam  Constitutional: She is  oriented to person, place, and time. She appears well-developed and well-nourished. No distress.  HENT:  Head: Normocephalic and atraumatic.  Eyes: Conjunctivae and lids are normal. Right eye exhibits no discharge. Left eye exhibits no discharge. No scleral icterus.  Neck: Normal range of motion. Neck supple. No JVD present. Carotid bruit is not present.  Cardiovascular: Normal rate, regular rhythm and normal heart sounds.   Pulmonary/Chest: Effort normal and breath sounds normal.  Abdominal: Normal appearance. There is no splenomegaly or hepatomegaly.  Musculoskeletal: Normal range of motion.  Neurological: She is alert and oriented to person, place, and time.  Skin: Skin is warm, dry and intact. No rash noted. No pallor.  Psychiatric: She has a normal mood and affect. Her behavior is normal. Judgment and thought content normal.    Results for orders placed or performed in visit on 09/30/16  Comprehensive metabolic panel  Result Value Ref Range   Glucose 79 65 - 99 mg/dL   BUN 7 (L) 8 - 27 mg/dL   Creatinine, Ser 0.91 0.57 - 1.00 mg/dL   GFR calc non Af Amer 64 >59 mL/min/1.73   GFR calc Af Wyvonnia Lora  74 >59 mL/min/1.73   BUN/Creatinine Ratio 8 (L) 12 - 28   Sodium 141 134 - 144 mmol/L   Potassium 4.0 3.5 - 5.2 mmol/L   Chloride 102 96 - 106 mmol/L   CO2 27 18 - 29 mmol/L   Calcium 9.1 8.7 - 10.3 mg/dL   Total Protein 6.7 6.0 - 8.5 g/dL   Albumin 4.1 3.5 - 4.8 g/dL   Globulin, Total 2.6 1.5 - 4.5 g/dL   Albumin/Globulin Ratio 1.6 1.2 - 2.2   Bilirubin Total 0.5 0.0 - 1.2 mg/dL   Alkaline Phosphatase 84 39 - 117 IU/L   AST 11 0 - 40 IU/L   ALT 7 0 - 32 IU/L  Lipid Panel w/o Chol/HDL Ratio  Result Value Ref Range   Cholesterol, Total 188 100 - 199 mg/dL   Triglycerides 53 0 - 149 mg/dL   HDL 64 >39 mg/dL   VLDL Cholesterol Cal 11 5 - 40 mg/dL   LDL Calculated 113 (H) 0 - 99 mg/dL  TSH  Result Value Ref Range   TSH 2.470 0.450 - 4.500 uIU/mL      Assessment & Plan:   Problem  List Items Addressed This Visit      Unprioritized   Major depression    Doing well with Zoloft.  Continue present treatment      Moderate persistent asthma without complication    Will continue her previousky effective treatment with Flovent and Proair as needed          Follow up plan: Return for physical.

## 2017-02-27 NOTE — Assessment & Plan Note (Signed)
Doing well with Zoloft.  Continue present treatment

## 2017-06-09 ENCOUNTER — Encounter: Payer: Self-pay | Admitting: Unknown Physician Specialty

## 2017-06-09 ENCOUNTER — Ambulatory Visit (INDEPENDENT_AMBULATORY_CARE_PROVIDER_SITE_OTHER): Payer: Medicare Other | Admitting: Unknown Physician Specialty

## 2017-06-09 VITALS — BP 137/80 | HR 69 | Temp 99.0°F | Ht 60.1 in | Wt 145.0 lb

## 2017-06-09 DIAGNOSIS — F332 Major depressive disorder, recurrent severe without psychotic features: Secondary | ICD-10-CM

## 2017-06-09 DIAGNOSIS — J42 Unspecified chronic bronchitis: Secondary | ICD-10-CM

## 2017-06-09 DIAGNOSIS — J449 Chronic obstructive pulmonary disease, unspecified: Secondary | ICD-10-CM

## 2017-06-09 DIAGNOSIS — L509 Urticaria, unspecified: Secondary | ICD-10-CM

## 2017-06-09 DIAGNOSIS — J209 Acute bronchitis, unspecified: Secondary | ICD-10-CM

## 2017-06-09 MED ORDER — HYDROCHLOROTHIAZIDE 25 MG PO TABS: 25.0000 mg | ORAL_TABLET | Freq: Every day | ORAL | 1 refills | Status: DC

## 2017-06-09 MED ORDER — ALBUTEROL SULFATE HFA 108 (90 BASE) MCG/ACT IN AERS: 2.0000 | INHALATION_SPRAY | RESPIRATORY_TRACT | 1 refills | Status: DC | PRN

## 2017-06-09 MED ORDER — PREDNISONE 20 MG PO TABS: 40.0000 mg | ORAL_TABLET | Freq: Every day | ORAL | 0 refills | Status: DC

## 2017-06-09 MED ORDER — HYDROCOD POLST-CPM POLST ER 10-8 MG/5ML PO SUER: 5.0000 mL | Freq: Two times a day (BID) | ORAL | 0 refills | Status: DC | PRN

## 2017-06-09 MED ORDER — BETAMETHASONE DIPROPIONATE AUG 0.05 % EX CREA: TOPICAL_CREAM | Freq: Two times a day (BID) | CUTANEOUS | 0 refills | Status: DC

## 2017-06-09 MED ORDER — SERTRALINE HCL 100 MG PO TABS: 100.0000 mg | ORAL_TABLET | Freq: Every day | ORAL | 3 refills | Status: DC

## 2017-06-09 NOTE — Assessment & Plan Note (Signed)
Increase Zoloft to 100 mg.

## 2017-06-09 NOTE — Progress Notes (Signed)
BP 137/80   Pulse 69   Temp 99 F (37.2 C)   Ht 5' 0.1" (1.527 m)   Wt 145 lb (65.8 kg)   SpO2 95%   BMI 28.22 kg/m    Subjective:    Patient ID: Shelly Bridges, female    DOB: September 24, 1946, 71 y.o.   MRN: [id number]  HPI: Shelly Bridges is a 71 y.o. female  Chief Complaint  Patient presents with  . Hypertension  . Depression  . URI    pt states that she would like another prescription for tussinex, states she just got back from [place] and his getting a cough again    Hypertension Using medications without difficulty Average home BPs   No problems or lightheadedness No chest pain with exertion or shortness of breath No Edema  Depression Taking 50 mg Zoloft.  Feeling worse in the last 6 months due to a lot of stress.  Wondering about increasing Zoloft Depression screen [facility] 2/9 06/09/2017 02/27/2017 09/30/2016 07/01/2016 06/03/2016  Decreased Interest 1 1 1  0 3  Down, Depressed, Hopeless 0 1 1 0 3  PHQ - 2 Score 1 2 2  0 6  Altered sleeping 1 - - - 3  Tired, decreased energy 2 - - - 3  Change in appetite 2 - - - 2  Feeling bad or failure about yourself  2 - - - 1  Trouble concentrating 2 - - - 1  Moving slowly or fidgety/restless 2 - - - 2  Suicidal thoughts 0 - - - 0  PHQ-9 Score 12 - - - 18   Asthma/COPD History of asthma and travels.  She has had a cough and increased SOB.     Rash Pt with red spots on upper arms and legs  Relevant past medical, surgical, family and social history reviewed and updated as indicated. Interim medical history since our last visit reviewed. Allergies and medications reviewed and updated.  Review of Systems  Per HPI unless specifically indicated above     Objective:    BP 137/80   Pulse 69   Temp 99 F (37.2 C)   Ht 5' 0.1" (1.527 m)   Wt 145 lb (65.8 kg)   SpO2 95%   BMI 28.22 kg/m   Wt Readings from Last 3 Encounters:  06/09/17 145 lb (65.8 kg)  02/27/17 142 lb 9.6 oz (64.7 kg)  02/01/17 143 lb  9.6 oz (65.1 kg)    Physical Exam  Constitutional: She is oriented to person, place, and time. She appears well-developed and well-nourished. No distress.  HENT:  Head: Normocephalic and atraumatic.  Eyes: Conjunctivae and lids are normal. Right eye exhibits no discharge. Left eye exhibits no discharge. No scleral icterus.  Neck: Normal range of motion. Neck supple. No JVD present. Carotid bruit is not present.  Cardiovascular: Normal rate, regular rhythm and normal heart sounds.   Pulmonary/Chest: Effort normal and breath sounds normal.  Abdominal: Normal appearance. There is no splenomegaly or hepatomegaly.  Musculoskeletal: Normal range of motion.  Neurological: She is alert and oriented to person, place, and time.  Skin: Skin is warm, dry and intact. No pallor.  Macular lesions upper arms  Psychiatric: She has a normal mood and affect. Her behavior is normal. Judgment and thought content normal.    Results for orders placed or performed in visit on 09/30/16  Comprehensive metabolic panel  Result Value Ref Range   Glucose 79 65 - 99 mg/dL   BUN 7 (L)  8 - 27 mg/dL   Creatinine, Ser 0.91 0.57 - 1.00 mg/dL   GFR calc non Af Amer 64 >59 mL/min/1.73   GFR calc Af Amer 74 >59 mL/min/1.73   BUN/Creatinine Ratio 8 (L) 12 - 28   Sodium 141 134 - 144 mmol/L   Potassium 4.0 3.5 - 5.2 mmol/L   Chloride 102 96 - 106 mmol/L   CO2 27 18 - 29 mmol/L   Calcium 9.1 8.7 - 10.3 mg/dL   Total Protein 6.7 6.0 - 8.5 g/dL   Albumin 4.1 3.5 - 4.8 g/dL   Globulin, Total 2.6 1.5 - 4.5 g/dL   Albumin/Globulin Ratio 1.6 1.2 - 2.2   Bilirubin Total 0.5 0.0 - 1.2 mg/dL   Alkaline Phosphatase 84 39 - 117 IU/L   AST 11 0 - 40 IU/L   ALT 7 0 - 32 IU/L  Lipid Panel w/o Chol/HDL Ratio  Result Value Ref Range   Cholesterol, Total 188 100 - 199 mg/dL   Triglycerides 53 0 - 149 mg/dL   HDL 64 >39 mg/dL   VLDL Cholesterol Cal 11 5 - 40 mg/dL   LDL Calculated 113 (H) 0 - 99 mg/dL  TSH  Result Value Ref  Range   TSH 2.470 0.450 - 4.500 uIU/mL  +++++++++++++++++++    Assessment & Plan:   Problem List Items Addressed This Visit      Unprioritized   COPD, severe (HCC)    Add Symbicort 2 puffs BID.  DC Flovent.        Relevant Medications   chlorpheniramine-HYDROcodone (TUSSIONEX PENNKINETIC ER) 10-8 MG/5ML SUER   predniSONE (DELTASONE) 20 MG tablet   albuterol (PROAIR HFA) 108 (90 Base) MCG/ACT inhaler   Major depression    Increase Zoloft to 100 mg      Relevant Medications   sertraline (ZOLOFT) 100 MG tablet    Other Visit Diagnoses    Acute exacerbation of chronic bronchitis (HCC)    -  Primary   Rx for prednisone 40 mg for 5 days.  Rx for Tussionex.  Change to Symbicort 2 puffs bid while sick   Relevant Medications   chlorpheniramine-HYDROcodone (TUSSIONEX PENNKINETIC ER) 10-8 MG/5ML SUER   predniSONE (DELTASONE) 20 MG tablet   albuterol (PROAIR HFA) 108 (90 Base) MCG/ACT inhaler   Urticaria       Rx for Diprolene cream      Demonstrated good inhaler technique  Follow up plan: Return in about 4 weeks (around 07/07/2017).

## 2017-06-09 NOTE — Assessment & Plan Note (Signed)
Add Symbicort 2 puffs BID.  DC Flovent.

## 2017-07-07 ENCOUNTER — Ambulatory Visit (INDEPENDENT_AMBULATORY_CARE_PROVIDER_SITE_OTHER): Payer: Medicare Other | Admitting: Unknown Physician Specialty

## 2017-07-07 ENCOUNTER — Encounter: Payer: Self-pay | Admitting: Unknown Physician Specialty

## 2017-07-07 DIAGNOSIS — J449 Chronic obstructive pulmonary disease, unspecified: Secondary | ICD-10-CM

## 2017-07-07 DIAGNOSIS — I10 Essential (primary) hypertension: Secondary | ICD-10-CM | POA: Diagnosis not present

## 2017-07-07 DIAGNOSIS — F332 Major depressive disorder, recurrent severe without psychotic features: Secondary | ICD-10-CM

## 2017-07-07 MED ORDER — BUDESONIDE-FORMOTEROL FUMARATE 160-4.5 MCG/ACT IN AERO: 2.0000 | INHALATION_SPRAY | Freq: Two times a day (BID) | RESPIRATORY_TRACT | 12 refills | Status: DC

## 2017-07-07 NOTE — Progress Notes (Signed)
BP 129/76   Pulse 66   Temp 98.2 F (36.8 C)   Wt 145 lb 12.8 oz (66.1 kg)   SpO2 97%   BMI 28.38 kg/m    Subjective:    Patient ID: Shelly Bridges, female    DOB: 1946/10/01, 71 y.o.   MRN: [id number]  HPI: Shelly Bridges is a 71 y.o. female  Chief Complaint  Patient presents with  . Depression    4 week f/up- pt states she is doing very well  . COPD    4 week f/up- pt states she would like a prescription for the sample inhaler she was given. Symbicort according to last OV note   COPD Added Symbicort last visit and doing well.  States she can really tell a difference, even with changing in the weather.    Depression States her depression is much better   Depression screen [facility] 2/9 07/07/2017 06/09/2017 02/27/2017 09/30/2016 07/01/2016  Decreased Interest 1 1 1 1  0  Down, Depressed, Hopeless 0 0 1 1 0  PHQ - 2 Score 1 1 2 2  0  Altered sleeping - 1 - - -  Tired, decreased energy - 2 - - -  Change in appetite - 2 - - -  Feeling bad or failure about yourself  - 2 - - -  Trouble concentrating - 2 - - -  Moving slowly or fidgety/restless - 2 - - -  Suicidal thoughts - 0 - - -  PHQ-9 Score - 12 - - -     Relevant past medical, surgical, family and social history reviewed and updated as indicated. Interim medical history since our last visit reviewed. Allergies and medications reviewed and updated.  Review of Systems  Per HPI unless specifically indicated above     Objective:    BP 129/76   Pulse 66   Temp 98.2 F (36.8 C)   Wt 145 lb 12.8 oz (66.1 kg)   SpO2 97%   BMI 28.38 kg/m   Wt Readings from Last 3 Encounters:  07/07/17 145 lb 12.8 oz (66.1 kg)  06/09/17 145 lb (65.8 kg)  02/27/17 142 lb 9.6 oz (64.7 kg)    Physical Exam  Constitutional: She is oriented to person, place, and time. She appears well-developed and well-nourished. No distress.  HENT:  Head: Normocephalic and atraumatic.  Eyes: Conjunctivae and lids are normal. Right eye  exhibits no discharge. Left eye exhibits no discharge. No scleral icterus.  Neck: Normal range of motion. Neck supple. No JVD present. Carotid bruit is not present.  Cardiovascular: Normal rate, regular rhythm and normal heart sounds.   Pulmonary/Chest: Effort normal and breath sounds normal.  Abdominal: Normal appearance. There is no splenomegaly or hepatomegaly.  Musculoskeletal: Normal range of motion.  Neurological: She is alert and oriented to person, place, and time.  Skin: Skin is warm, dry and intact. No rash noted. No pallor.  Psychiatric: She has a normal mood and affect. Her behavior is normal. Judgment and thought content normal.    Results for orders placed or performed in visit on 09/30/16  Comprehensive metabolic panel  Result Value Ref Range   Glucose 79 65 - 99 mg/dL   BUN 7 (L) 8 - 27 mg/dL   Creatinine, Ser 0.91 0.57 - 1.00 mg/dL   GFR calc non Af Amer 64 >59 mL/min/1.73   GFR calc Af Amer 74 >59 mL/min/1.73   BUN/Creatinine Ratio 8 (L) 12 - 28   Sodium 141 134 -  144 mmol/L   Potassium 4.0 3.5 - 5.2 mmol/L   Chloride 102 96 - 106 mmol/L   CO2 27 18 - 29 mmol/L   Calcium 9.1 8.7 - 10.3 mg/dL   Total Protein 6.7 6.0 - 8.5 g/dL   Albumin 4.1 3.5 - 4.8 g/dL   Globulin, Total 2.6 1.5 - 4.5 g/dL   Albumin/Globulin Ratio 1.6 1.2 - 2.2   Bilirubin Total 0.5 0.0 - 1.2 mg/dL   Alkaline Phosphatase 84 39 - 117 IU/L   AST 11 0 - 40 IU/L   ALT 7 0 - 32 IU/L  Lipid Panel w/o Chol/HDL Ratio  Result Value Ref Range   Cholesterol, Total 188 100 - 199 mg/dL   Triglycerides 53 0 - 149 mg/dL   HDL 64 >39 mg/dL   VLDL Cholesterol Cal 11 5 - 40 mg/dL   LDL Calculated 113 (H) 0 - 99 mg/dL  TSH  Result Value Ref Range   TSH 2.470 0.450 - 4.500 uIU/mL      Assessment & Plan:   Problem List Items Addressed This Visit      Unprioritized   COPD, severe (HCC)    Stable, continue present medications.        Essential hypertension, benign    Stable, continue present  medications.        Major depression    Stable, continue present medications.            Follow up plan: Return if symptoms worsen or fail to improve.

## 2017-07-07 NOTE — Assessment & Plan Note (Signed)
Stable, continue present medications.   

## 2017-08-31 ENCOUNTER — Telehealth: Payer: Self-pay | Admitting: Unknown Physician Specialty

## 2017-08-31 NOTE — Telephone Encounter (Signed)
Called pt to schedule for Annual Wellness Visit with Nurse Health Advisor, Tiffany Hill, my c/b # is [phone]  Kathryn Brown ° °

## 2017-09-08 NOTE — Telephone Encounter (Signed)
Called to schedule medicare annual wellness visit with patient, left message for patient to call back

## 2017-10-11 ENCOUNTER — Encounter: Payer: Self-pay | Admitting: Unknown Physician Specialty

## 2017-10-11 ENCOUNTER — Ambulatory Visit: Payer: Medicare Other | Admitting: Unknown Physician Specialty

## 2017-10-11 ENCOUNTER — Ambulatory Visit (INDEPENDENT_AMBULATORY_CARE_PROVIDER_SITE_OTHER): Payer: Medicare Other

## 2017-10-11 VITALS — BP 134/74 | HR 67 | Temp 98.6°F | Ht 60.0 in | Wt 148.1 lb

## 2017-10-11 VITALS — BP 134/74 | HR 67 | Temp 98.6°F | Resp 16 | Ht 60.0 in | Wt 148.1 lb

## 2017-10-11 DIAGNOSIS — Z0000 Encounter for general adult medical examination without abnormal findings: Secondary | ICD-10-CM

## 2017-10-11 DIAGNOSIS — Z23 Encounter for immunization: Secondary | ICD-10-CM

## 2017-10-11 DIAGNOSIS — Z7189 Other specified counseling: Secondary | ICD-10-CM | POA: Diagnosis not present

## 2017-10-11 DIAGNOSIS — Z1159 Encounter for screening for other viral diseases: Secondary | ICD-10-CM

## 2017-10-11 DIAGNOSIS — I10 Essential (primary) hypertension: Secondary | ICD-10-CM

## 2017-10-11 DIAGNOSIS — J449 Chronic obstructive pulmonary disease, unspecified: Secondary | ICD-10-CM | POA: Diagnosis not present

## 2017-10-11 DIAGNOSIS — F419 Anxiety disorder, unspecified: Secondary | ICD-10-CM | POA: Diagnosis not present

## 2017-10-11 MED ORDER — SERTRALINE HCL 100 MG PO TABS: 100.0000 mg | ORAL_TABLET | Freq: Every day | ORAL | 3 refills | Status: DC

## 2017-10-11 MED ORDER — HYDROXYZINE HCL 10 MG PO TABS: 10.0000 mg | ORAL_TABLET | Freq: Three times a day (TID) | ORAL | 0 refills | Status: DC | PRN

## 2017-10-11 MED ORDER — HYDROCHLOROTHIAZIDE 25 MG PO TABS: 25.0000 mg | ORAL_TABLET | Freq: Every day | ORAL | 1 refills | Status: DC

## 2017-10-11 MED ORDER — BETAMETHASONE DIPROPIONATE AUG 0.05 % EX CREA: TOPICAL_CREAM | Freq: Two times a day (BID) | CUTANEOUS | 0 refills | Status: DC

## 2017-10-11 NOTE — Patient Instructions (Addendum)
Shelly Bridges , Thank you for taking time to come for your Medicare Wellness Visit. I appreciate your ongoing commitment to your health goals. Please review the following plan we discussed and let me know if I can assist you in the future.   Screening recommendations/referrals: Colonoscopy: due now,declined Mammogram: due now, declined Bone Density: completed 07/09/2014 Recommended yearly ophthalmology/optometry visit for glaucoma screening and checkup Recommended yearly dental visit for hygiene and checkup  Vaccinations: Influenza vaccine: up to date Pneumococcal vaccine: up to date Tdap vaccine: up to date Shingles vaccine: due, check with your insurance company for coverage   Advanced directives: Advance directive discussed with you today. Even though you declined this today please call our office should you change your mind and we can give you the proper paperwork for you to fill out.  Conditions/risks identified: Recommend drinking at least 4-5 glasses of water a day  Next appointment: Follow up in one year for your annual wellness exam.   Preventive Care 65 Years and Older, Female Preventive care refers to lifestyle choices and visits with your health care provider that can promote health and wellness. What does preventive care include?  A yearly physical exam. This is also called an annual well check.  Dental exams once or twice a year.  Routine eye exams. Ask your health care provider how often you should have your eyes checked.  Personal lifestyle choices, including:  Daily care of your teeth and gums.  Regular physical activity.  Eating a healthy diet.  Avoiding tobacco and drug use.  Limiting alcohol use.  Practicing safe sex.  Taking low-dose aspirin every day.  Taking vitamin and mineral supplements as recommended by your health care provider. What happens during an annual well check? The services and screenings done by your health care provider during your  annual well check will depend on your age, overall health, lifestyle risk factors, and family history of disease. Counseling  Your health care provider may ask you questions about your:  Alcohol use.  Tobacco use.  Drug use.  Emotional well-being.  Home and relationship well-being.  Sexual activity.  Eating habits.  History of falls.  Memory and ability to understand (cognition).  Work and work Statistician.  Reproductive health. Screening  You may have the following tests or measurements:  Height, weight, and BMI.  Blood pressure.  Lipid and cholesterol levels. These may be checked every 5 years, or more frequently if you are over 68 years old.  Skin check.  Lung cancer screening. You may have this screening every year starting at age 54 if you have a 30-pack-year history of smoking and currently smoke or have quit within the past 15 years.  Fecal occult blood test (FOBT) of the stool. You may have this test every year starting at age 58.  Flexible sigmoidoscopy or colonoscopy. You may have a sigmoidoscopy every 5 years or a colonoscopy every 10 years starting at age 14.  Hepatitis C blood test.  Hepatitis B blood test.  Sexually transmitted disease (STD) testing.  Diabetes screening. This is done by checking your blood sugar (glucose) after you have not eaten for a while (fasting). You may have this done every 1-3 years.  Bone density scan. This is done to screen for osteoporosis. You may have this done starting at age 34.  Mammogram. This may be done every 1-2 years. Talk to your health care provider about how often you should have regular mammograms. Talk with your health care provider about your  test results, treatment options, and if necessary, the need for more tests. Vaccines  Your health care provider may recommend certain vaccines, such as:  Influenza vaccine. This is recommended every year.  Tetanus, diphtheria, and acellular pertussis (Tdap, Td)  vaccine. You may need a Td booster every 10 years.  Zoster vaccine. You may need this after age 49.  Pneumococcal 13-valent conjugate (PCV13) vaccine. One dose is recommended after age 53.  Pneumococcal polysaccharide (PPSV23) vaccine. One dose is recommended after age 57. Talk to your health care provider about which screenings and vaccines you need and how often you need them. This information is not intended to replace advice given to you by your health care provider. Make sure you discuss any questions you have with your health care provider. Document Released: 12/18/2015 Document Revised: 08/10/2016 Document Reviewed: 09/22/2015 Elsevier Interactive Patient Education  2017 [place] Prevention in the Home Falls can cause injuries. They can happen to people of all ages. There are many things you can do to make your home safe and to help prevent falls. What can I do on the outside of my home?  Regularly fix the edges of walkways and driveways and fix any cracks.  Remove anything that might make you trip as you walk through a door, such as a raised step or threshold.  Trim any bushes or trees on the path to your home.  Use bright outdoor lighting.  Clear any walking paths of anything that might make someone trip, such as rocks or tools.  Regularly check to see if handrails are loose or broken. Make sure that both sides of any steps have handrails.  Any raised decks and porches should have guardrails on the edges.  Have any leaves, snow, or ice cleared regularly.  Use sand or salt on walking paths during winter.  Clean up any spills in your garage right away. This includes oil or grease spills. What can I do in the bathroom?  Use night lights.  Install grab bars by the toilet and in the tub and shower. Do not use towel bars as grab bars.  Use non-skid mats or decals in the tub or shower.  If you need to sit down in the shower, use a plastic, non-slip  stool.  Keep the floor dry. Clean up any water that spills on the floor as soon as it happens.  Remove soap buildup in the tub or shower regularly.  Attach bath mats securely with double-sided non-slip rug tape.  Do not have throw rugs and other things on the floor that can make you trip. What can I do in the bedroom?  Use night lights.  Make sure that you have a light by your bed that is easy to reach.  Do not use any sheets or blankets that are too big for your bed. They should not hang down onto the floor.  Have a firm chair that has side arms. You can use this for support while you get dressed.  Do not have throw rugs and other things on the floor that can make you trip. What can I do in the kitchen?  Clean up any spills right away.  Avoid walking on wet floors.  Keep items that you use a lot in easy-to-reach places.  If you need to reach something above you, use a strong step stool that has a grab bar.  Keep electrical cords out of the way.  Do not use floor polish or wax that  makes floors slippery. If you must use wax, use non-skid floor wax.  Do not have throw rugs and other things on the floor that can make you trip. What can I do with my stairs?  Do not leave any items on the stairs.  Make sure that there are handrails on both sides of the stairs and use them. Fix handrails that are broken or loose. Make sure that handrails are as long as the stairways.  Check any carpeting to make sure that it is firmly attached to the stairs. Fix any carpet that is loose or worn.  Avoid having throw rugs at the top or bottom of the stairs. If you do have throw rugs, attach them to the floor with carpet tape.  Make sure that you have a light switch at the top of the stairs and the bottom of the stairs. If you do not have them, ask someone to add them for you. What else can I do to help prevent falls?  Wear shoes that:  Do not have high heels.  Have rubber bottoms.  Are  comfortable and fit you well.  Are closed at the toe. Do not wear sandals.  If you use a stepladder:  Make sure that it is fully opened. Do not climb a closed stepladder.  Make sure that both sides of the stepladder are locked into place.  Ask someone to hold it for you, if possible.  Clearly mark and make sure that you can see:  Any grab bars or handrails.  First and last steps.  Where the edge of each step is.  Use tools that help you move around (mobility aids) if they are needed. These include:  Canes.  Walkers.  Scooters.  Crutches.  Turn on the lights when you go into a dark area. Replace any light bulbs as soon as they burn out.  Set up your furniture so you have a clear path. Avoid moving your furniture around.  If any of your floors are uneven, fix them.  If there are any pets around you, be aware of where they are.  Review your medicines with your doctor. Some medicines can make you feel dizzy. This can increase your chance of falling. Ask your doctor what other things that you can do to help prevent falls. This information is not intended to replace advice given to you by your health care provider. Make sure you discuss any questions you have with your health care provider. Document Released: 09/17/2009 Document Revised: 04/28/2016 Document Reviewed: 12/26/2014 Elsevier Interactive Patient Education  2017 [employer].

## 2017-10-11 NOTE — Assessment & Plan Note (Signed)
Stable, continue present medications.   

## 2017-10-11 NOTE — Progress Notes (Signed)
BP 134/74   Pulse 67   Temp 98.6 F (37 C) (Oral)   Ht 5' (1.524 m)   Wt 148 lb 1.6 oz (67.2 kg)   SpO2 95%   BMI 28.92 kg/m    Subjective:    Patient ID: Shelly Bridges, female    DOB: [birth year], 71 y.o.   MRN: [id number]  HPI: Shelly Bridges is a 71 y.o. female  Chief Complaint  Patient presents with  . Annual Exam    pt having Wellness with [facility] today   . Labs Only    pt interested in Hep C   COPD/Asthma States she really like the Symbicort inhaler and can tell a difference.  Rare SOB and rare use of Proair.    Depression States she is doing OK.  States she gets anxious easily.   Depression screen [facility] 2/9 10/11/2017 07/07/2017 06/09/2017 02/27/2017 09/30/2016  Decreased Interest 0 1 1 1 1   Down, Depressed, Hopeless 0 0 0 1 1  PHQ - 2 Score 0 1 1 2 2   Altered sleeping 0 - 1 - -  Tired, decreased energy 0 - 2 - -  Change in appetite 0 - 2 - -  Feeling bad or failure about yourself  0 - 2 - -  Trouble concentrating 0 - 2 - -  Moving slowly or fidgety/restless 0 - 2 - -  Suicidal thoughts 0 - 0 - -  PHQ-9 Score 0 - 12 - -   Hypertension Using medications without difficulty Average home BPs stable   No problems or lightheadedness No chest pain with exertion or shortness of breath No Edema  Relevant past medical, surgical, family and social history reviewed and updated as indicated. Interim medical history since our last visit reviewed. Allergies and medications reviewed and updated.  Review of Systems  Per HPI unless specifically indicated above     Objective:    BP 134/74   Pulse 67   Temp 98.6 F (37 C) (Oral)   Ht 5' (1.524 m)   Wt 148 lb 1.6 oz (67.2 kg)   SpO2 95%   BMI 28.92 kg/m   Wt Readings from Last 3 Encounters:  10/11/17 148 lb 1.6 oz (67.2 kg)  07/07/17 145 lb 12.8 oz (66.1 kg)  06/09/17 145 lb (65.8 kg)    Physical Exam  Constitutional: She is oriented to person, place, and time. She appears well-developed and  well-nourished. No distress.  HENT:  Head: Normocephalic and atraumatic.  Eyes: Conjunctivae and lids are normal. Right eye exhibits no discharge. Left eye exhibits no discharge. No scleral icterus.  Neck: Normal range of motion. Neck supple. No JVD present. Carotid bruit is not present.  Cardiovascular: Normal rate, regular rhythm and normal heart sounds.  Pulmonary/Chest: Effort normal and breath sounds normal.  Abdominal: Normal appearance. There is no splenomegaly or hepatomegaly.  Musculoskeletal: Normal range of motion.  Neurological: She is alert and oriented to person, place, and time.  Skin: Skin is warm, dry and intact. No rash noted. No pallor.  Psychiatric: She has a normal mood and affect. Her behavior is normal. Judgment and thought content normal.    Results for orders placed or performed in visit on 09/30/16  Comprehensive metabolic panel  Result Value Ref Range   Glucose 79 65 - 99 mg/dL   BUN 7 (L) 8 - 27 mg/dL   Creatinine, Ser 0.91 0.57 - 1.00 mg/dL   GFR calc non Af Amer 64 >59 mL/min/1.73  GFR calc Af Amer 74 >59 mL/min/1.73   BUN/Creatinine Ratio 8 (L) 12 - 28   Sodium 141 134 - 144 mmol/L   Potassium 4.0 3.5 - 5.2 mmol/L   Chloride 102 96 - 106 mmol/L   CO2 27 18 - 29 mmol/L   Calcium 9.1 8.7 - 10.3 mg/dL   Total Protein 6.7 6.0 - 8.5 g/dL   Albumin 4.1 3.5 - 4.8 g/dL   Globulin, Total 2.6 1.5 - 4.5 g/dL   Albumin/Globulin Ratio 1.6 1.2 - 2.2   Bilirubin Total 0.5 0.0 - 1.2 mg/dL   Alkaline Phosphatase 84 39 - 117 IU/L   AST 11 0 - 40 IU/L   ALT 7 0 - 32 IU/L  Lipid Panel w/o Chol/HDL Ratio  Result Value Ref Range   Cholesterol, Total 188 100 - 199 mg/dL   Triglycerides 53 0 - 149 mg/dL   HDL 64 >39 mg/dL   VLDL Cholesterol Cal 11 5 - 40 mg/dL   LDL Calculated 113 (H) 0 - 99 mg/dL  TSH  Result Value Ref Range   TSH 2.470 0.450 - 4.500 uIU/mL      Assessment & Plan:   Problem List Items Addressed This Visit      Unprioritized   Advanced  care planning/counseling discussion    A voluntary discussion about advance care planning including the explanation and discussion of advance directives was extensively discussed  with the patient.  Explanation about the health care proxy and Living will was reviewed and packet with forms with explanation of how to fill them out was given.  During this discussion, the patient was able to identify her health care surrogate as her son and her granddaughter as secondary and plans to fill out the paperwork required.  Patient was offered a separate [place] visit for further assistance with forms.         Anxiety    Occasional problem. Add Hydroxyzine prn      COPD, severe (HCC)    Stable, continue present medications.        Essential hypertension, benign    Stable, continue present medications.         Other Visit Diagnoses    Need for influenza vaccination    -  Primary   Relevant Orders   Flu vaccine HIGH DOSE PF (Completed)      Health maintenance: Refusing colonoscopy and Mammogram  Follow up plan: Return in about 6 months (around 04/10/2018), or if symptoms worsen or fail to improve.

## 2017-10-11 NOTE — Assessment & Plan Note (Signed)
A voluntary discussion about advance care planning including the explanation and discussion of advance directives was extensively discussed  with the patient.  Explanation about the health care proxy and Living will was reviewed and packet with forms with explanation of how to fill them out was given.  During this discussion, the patient was able to identify her health care surrogate as her son and her granddaughter as secondary and plans to fill out the paperwork required.  Patient was offered a separate [place] visit for further assistance with forms.

## 2017-10-11 NOTE — Progress Notes (Signed)
Subjective:   Shelly Bridges is a 71 y.o. female who presents for Medicare Annual (Subsequent) preventive examination.  Review of Systems:  Cardiac Risk Factors include: advanced age (>48men, >33 women);hypertension     Objective:     Vitals: BP 134/74 (BP Location: Left Arm, Patient Position: Sitting)   Pulse 67   Temp 98.6 F (37 C) (Oral)   Resp 16   Ht 5' (1.524 m)   Wt 148 lb 1.6 oz (67.2 kg)   BMI 28.92 kg/m   Body mass index is 28.92 kg/m.   Tobacco Social History   Tobacco Use  Smoking Status Former Smoker  . Packs/day: 1.00  . Years: 20.00  . Pack years: 20.00  . Types: Cigarettes  . Last attempt to quit: 06/17/1985  . Years since quitting: 32.3  Smokeless Tobacco Never Used     Counseling given: Not Answered   Past Medical History:  Diagnosis Date  . Anxiety   . Asthma   . Lipoma   . Neutropenia ([place])   . Osteoporosis   . Vitamin D deficiency disease    History reviewed. No pertinent surgical history. Family History  Problem Relation Age of Onset  . Dementia Mother   . Hypertension Mother   . Osteoporosis Mother   . Stroke Mother        mini stroke  . Cancer Father        brain tumor and lung  . Hypertension Father   . Emphysema Father   . COPD Neg Hx   . Heart disease Neg Hx    Social History   Substance and Sexual Activity  Sexual Activity No    Outpatient Encounter Medications as of 10/11/2017  Medication Sig  . albuterol (PROAIR HFA) 108 (90 Base) MCG/ACT inhaler Inhale 2 puffs into the lungs every 4 (four) hours as needed for wheezing or shortness of breath.  Marland Kitchen augmented betamethasone dipropionate (DIPROLENE AF) 0.05 % cream Apply 2 (two) times daily topically.  . budesonide-formoterol (SYMBICORT) 160-4.5 MCG/ACT inhaler Inhale 2 puffs into the lungs 2 (two) times daily.  . hydrochlorothiazide (HYDRODIURIL) 25 MG tablet Take 1 tablet (25 mg total) daily by mouth.  . hydrOXYzine (ATARAX/VISTARIL) 10 MG tablet Take 1  tablet (10 mg total) 3 (three) times daily as needed by mouth.  . sertraline (ZOLOFT) 100 MG tablet Take 1 tablet (100 mg total) daily by mouth.   No facility-administered encounter medications on file as of 10/11/2017.     Activities of Daily Living In your present state of health, do you have any difficulty performing the following activities: 10/11/2017 10/11/2017  Hearing? N N  Vision? N N  Difficulty concentrating or making decisions? N N  Walking or climbing stairs? N N  Dressing or bathing? N N  Doing errands, shopping? N N  Preparing Food and eating ? N -  Using the Toilet? N -  In the past six months, have you accidently leaked urine? N -  Do you have problems with loss of bowel control? N -  Managing your Medications? N -  Managing your Finances? N -  Housekeeping or managing your Housekeeping? N -  Some recent data might be hidden    Patient Care Team: Kathrine Haddock, NP as PCP - General (Nurse Practitioner)    Assessment:     Exercise Activities and Dietary recommendations Current Exercise Habits: The patient does not participate in regular exercise at present, Exercise limited by: None identified  Goals  None     Fall Risk Fall Risk  10/11/2017 10/11/2017 07/07/2017 09/30/2016 07/01/2016  Falls in the past year? No No No No No   Depression Screen PHQ 2/9 Scores 10/11/2017 10/11/2017 07/07/2017 06/09/2017  PHQ - 2 Score 0 0 1 1  PHQ- 9 Score 0 0 - 12     Cognitive Function     6CIT Screen 10/11/2017  What Year? 0 points  What month? 0 points  What time? 0 points  Count back from 20 0 points  Months in reverse 0 points  Repeat phrase 2 points  Total Score 2    Immunization History  Administered Date(s) Administered  . Influenza, High Dose Seasonal PF 10/25/2016, 10/11/2017  . Influenza,inj,Quad PF,6+ Mos 10/26/2015  . Pneumococcal Conjugate-13 07/03/2014  . Pneumococcal Polysaccharide-23 10/26/2015  . Td 12/05/2008   Screening Tests Health  Maintenance  Topic Date Due  . Hepatitis C Screening  02/02/1946  . MAMMOGRAM  09/03/2010  . COLONOSCOPY  10/11/2018 (Originally 02/19/1996)  . TETANUS/TDAP  12/05/2018  . INFLUENZA VACCINE  Completed  . DEXA SCAN  Completed  . PNA vac Low Risk Adult  Completed      Plan:      I have personally reviewed and addressed the Medicare Annual Wellness questionnaire and have noted the following in the patient's chart:  A. Medical and social history B. Use of alcohol, tobacco or illicit drugs  C. Current medications and supplements D. Functional ability and status E.  Nutritional status F.  Physical activity G. Advance directives H. List of other physicians I.  Hospitalizations, surgeries, and ER visits in previous 12 months J.  [place] such as hearing and vision if needed, cognitive and depression L. Referrals and appointments   In addition, I have reviewed and discussed with patient certain preventive protocols, quality metrics, and best practice recommendations. A written personalized care plan for preventive services as well as general preventive health recommendations were provided to patient.   Signed,  Tyler Aas, LPN Nurse Health Advisor   Nurse Notes: none

## 2017-10-11 NOTE — Patient Instructions (Addendum)

## 2017-10-11 NOTE — Assessment & Plan Note (Addendum)
Occasional problem. Add Hydroxyzine prn

## 2017-10-12 LAB — HEPATITIS C ANTIBODY: Hep C Virus Ab: 0.1 s/co ratio (ref 0.0–0.9)

## 2017-10-13 ENCOUNTER — Telehealth: Payer: Self-pay

## 2017-10-13 NOTE — Telephone Encounter (Signed)
No, those medications are not appropriate.  Let her know it should be inexpensive not using insurance.

## 2017-10-13 NOTE — Telephone Encounter (Signed)
PA was done for patient's hydroxyzine and was denied. Received a fax stating that patient must first try either Effexor XR or Lexapro first. Can we change the patient's medications to one of the preferred?

## 2017-10-13 NOTE — Telephone Encounter (Signed)
Called and spoke to patient's daughter in law (signed DPR). I was explaining to her about the patient's medication and he states that she will have the patient call back for [place] to give her the information. OK for [facility] to disclose information to the patient when she calls back.

## 2017-10-16 NOTE — Telephone Encounter (Signed)
Called and spoke to patient. She states that she spoke with her daughter in law and picked up her medication last week. She states that it was not that expensive so she just went ahead and got it. Patient thanked me for calling though.

## 2017-11-03 ENCOUNTER — Telehealth: Payer: Self-pay

## 2017-11-03 NOTE — Telephone Encounter (Signed)
-----   Message from Kathrine Haddock, NP sent at 10/31/2017  7:39 AM EST ----- Regarding: labs It looks like this pt did not get labs when she was here for her physcial.  Can she come in and get them? ----- Message ----- From: SYSTEM Sent: 10/16/2017  12:06 AM To: Kathrine Haddock, NP

## 2017-11-03 NOTE — Telephone Encounter (Signed)
Called and spoke to patient. She is going to come back by for labs at the beginning of next week.

## 2017-11-06 ENCOUNTER — Other Ambulatory Visit: Payer: Medicare Other

## 2017-11-07 ENCOUNTER — Encounter: Payer: Self-pay | Admitting: Unknown Physician Specialty

## 2017-11-07 LAB — COMPREHENSIVE METABOLIC PANEL WITH GFR
ALT: 12 IU/L (ref 0–32)
AST: 16 IU/L (ref 0–40)
Albumin/Globulin Ratio: 1.7 (ref 1.2–2.2)
Albumin: 4.1 g/dL (ref 3.5–4.8)
Alkaline Phosphatase: 87 IU/L (ref 39–117)
BUN/Creatinine Ratio: 14 (ref 12–28)
BUN: 11 mg/dL (ref 8–27)
Bilirubin Total: 0.5 mg/dL (ref 0.0–1.2)
CO2: 26 mmol/L (ref 20–29)
Calcium: 9.3 mg/dL (ref 8.7–10.3)
Chloride: 93 mmol/L — ABNORMAL LOW (ref 96–106)
Creatinine, Ser: 0.76 mg/dL (ref 0.57–1.00)
GFR calc Af Amer: 91 mL/min/1.73
GFR calc non Af Amer: 79 mL/min/1.73
Globulin, Total: 2.4 g/dL (ref 1.5–4.5)
Glucose: 87 mg/dL (ref 65–99)
Potassium: 3.7 mmol/L (ref 3.5–5.2)
Sodium: 133 mmol/L — ABNORMAL LOW (ref 134–144)
Total Protein: 6.5 g/dL (ref 6.0–8.5)

## 2017-11-07 LAB — LIPID PANEL W/O CHOL/HDL RATIO
Cholesterol, Total: 196 mg/dL (ref 100–199)
HDL: 64 mg/dL
LDL Calculated: 120 mg/dL — ABNORMAL HIGH (ref 0–99)
Triglycerides: 60 mg/dL (ref 0–149)
VLDL Cholesterol Cal: 12 mg/dL (ref 5–40)

## 2017-11-07 MED ORDER — ATORVASTATIN CALCIUM 10 MG PO TABS: 10.0000 mg | ORAL_TABLET | Freq: Every day | ORAL | 3 refills | Status: DC

## 2018-02-01 ENCOUNTER — Other Ambulatory Visit: Payer: Self-pay | Admitting: Unknown Physician Specialty

## 2018-02-01 MED ORDER — BETAMETHASONE DIPROPIONATE AUG 0.05 % EX CREA: TOPICAL_CREAM | Freq: Two times a day (BID) | CUTANEOUS | 0 refills | Status: DC

## 2018-03-10 ENCOUNTER — Other Ambulatory Visit: Payer: Self-pay | Admitting: Unknown Physician Specialty

## 2018-04-10 ENCOUNTER — Ambulatory Visit: Payer: Medicare Other | Admitting: Unknown Physician Specialty

## 2018-04-11 ENCOUNTER — Encounter: Payer: Self-pay | Admitting: Unknown Physician Specialty

## 2018-04-11 ENCOUNTER — Other Ambulatory Visit: Payer: Self-pay | Admitting: Unknown Physician Specialty

## 2018-04-11 ENCOUNTER — Ambulatory Visit (INDEPENDENT_AMBULATORY_CARE_PROVIDER_SITE_OTHER): Payer: Medicare Other | Admitting: Unknown Physician Specialty

## 2018-04-11 VITALS — BP 139/82 | HR 69 | Temp 98.3°F | Ht 60.0 in | Wt 147.8 lb

## 2018-04-11 DIAGNOSIS — J449 Chronic obstructive pulmonary disease, unspecified: Secondary | ICD-10-CM

## 2018-04-11 DIAGNOSIS — E785 Hyperlipidemia, unspecified: Secondary | ICD-10-CM

## 2018-04-11 DIAGNOSIS — E559 Vitamin D deficiency, unspecified: Secondary | ICD-10-CM | POA: Diagnosis not present

## 2018-04-11 DIAGNOSIS — I10 Essential (primary) hypertension: Secondary | ICD-10-CM | POA: Diagnosis not present

## 2018-04-11 DIAGNOSIS — F419 Anxiety disorder, unspecified: Secondary | ICD-10-CM

## 2018-04-11 DIAGNOSIS — D702 Other drug-induced agranulocytosis: Secondary | ICD-10-CM | POA: Diagnosis not present

## 2018-04-11 MED ORDER — BUDESONIDE-FORMOTEROL FUMARATE 160-4.5 MCG/ACT IN AERO: 2.0000 | INHALATION_SPRAY | Freq: Two times a day (BID) | RESPIRATORY_TRACT | 12 refills | Status: DC

## 2018-04-11 MED ORDER — BETAMETHASONE DIPROPIONATE AUG 0.05 % EX CREA: TOPICAL_CREAM | Freq: Two times a day (BID) | CUTANEOUS | 0 refills | Status: DC

## 2018-04-11 MED ORDER — ALBUTEROL SULFATE HFA 108 (90 BASE) MCG/ACT IN AERS: 2.0000 | INHALATION_SPRAY | RESPIRATORY_TRACT | 1 refills | Status: DC | PRN

## 2018-04-11 MED ORDER — SERTRALINE HCL 100 MG PO TABS: 100.0000 mg | ORAL_TABLET | Freq: Every day | ORAL | 3 refills | Status: DC

## 2018-04-11 NOTE — Assessment & Plan Note (Signed)
Off of the Atorvastatin.  Will check cholesterol levels

## 2018-04-11 NOTE — Assessment & Plan Note (Signed)
Controlled on current medications 

## 2018-04-11 NOTE — Progress Notes (Signed)
BP 139/82 (BP Location: Left Arm, Cuff Size: Normal)   Pulse 69   Temp 98.3 F (36.8 C) (Oral)   Ht 5' (1.524 m)   Wt 147 lb 12.8 oz (67 kg)   SpO2 95%   BMI 28.87 kg/m    Subjective:    Patient ID: Shelly Bridges, female    DOB: 1946/04/20, 72 y.o.   MRN: [id number]  HPI: Shelly Bridges is a 72 y.o. female  Chief Complaint  Patient presents with  . Depression  . Hyperlipidemia   Hypertension Stopped HCTZ as seemed to decrease "spots on legs"  Still has them but seemed to improve Average home BPs: Does not check   No problems or lightheadedness No chest pain with exertion or shortness of breath No Edema  Hyperlipidemia Stopped for the above reasons Diet compliance: No changes Exercise:Trying to go to the gym  COPD Has not been here for any breathing issues since starting the Symbicort.  Previously it was 4 times/year.    Depression Anxiety is improved.  Needs a refill on Zoloft.   Depression screen [facility] 2/9 04/11/2018 10/11/2017 10/11/2017 07/07/2017 06/09/2017  Decreased Interest 0 0 0 1 1  Down, Depressed, Hopeless 0 0 0 0 0  PHQ - 2 Score 0 0 0 1 1  Altered sleeping 1 0 0 - 1  Tired, decreased energy 1 0 0 - 2  Change in appetite 1 0 0 - 2  Feeling bad or failure about yourself  0 0 0 - 2  Trouble concentrating 0 0 0 - 2  Moving slowly or fidgety/restless 0 0 0 - 2  Suicidal thoughts 0 0 0 - 0  PHQ-9 Score 3 0 0 - 12     Relevant past medical, surgical, family and social history reviewed and updated as indicated. Interim medical history since our last visit reviewed. Allergies and medications reviewed and updated.  Review of Systems  Per HPI unless specifically indicated above     Objective:    BP 139/82 (BP Location: Left Arm, Cuff Size: Normal)   Pulse 69   Temp 98.3 F (36.8 C) (Oral)   Ht 5' (1.524 m)   Wt 147 lb 12.8 oz (67 kg)   SpO2 95%   BMI 28.87 kg/m   Wt Readings from Last 3 Encounters:  04/11/18 147 lb 12.8 oz (67 kg)   10/11/17 148 lb 1.6 oz (67.2 kg)  10/11/17 148 lb 1.6 oz (67.2 kg)    Physical Exam  Constitutional: She is oriented to person, place, and time. She appears well-developed and well-nourished. No distress.  HENT:  Head: Normocephalic and atraumatic.  Eyes: Conjunctivae and lids are normal. Right eye exhibits no discharge. Left eye exhibits no discharge. No scleral icterus.  Neck: Normal range of motion. Neck supple. No JVD present. Carotid bruit is not present.  Cardiovascular: Normal rate, regular rhythm and normal heart sounds.  Pulmonary/Chest: Effort normal and breath sounds normal.  Abdominal: Normal appearance. There is no splenomegaly or hepatomegaly.  Musculoskeletal: Normal range of motion.  Neurological: She is alert and oriented to person, place, and time.  Skin: Skin is warm, dry and intact. No rash noted. No pallor.  Psychiatric: She has a normal mood and affect. Her behavior is normal. Judgment and thought content normal.     Assessment & Plan:   Problem List Items Addressed This Visit      Unprioritized   Anxiety    Controlled on current medications  Relevant Medications   sertraline (ZOLOFT) 100 MG tablet   COPD, severe ([place])    Doing well since adding Symbicort.  No SOB at this time and no exacerbations in the last 6 months.        Relevant Medications   budesonide-formoterol (SYMBICORT) 160-4.5 MCG/ACT inhaler   albuterol (PROAIR HFA) 108 (90 Base) MCG/ACT inhaler   Essential hypertension, benign    Stable not on medications.  Will not restart at this time.  Explained to pt goal is to keep BP below 140/90.  She will check at home and again in the next 6 months      Relevant Orders   Comprehensive metabolic panel   Hyperlipidemia    Off of the Atorvastatin.  Will check cholesterol levels      Relevant Orders   Lipid Panel w/o Chol/HDL Ratio   Neutropenia (HCC)   Relevant Orders   CBC with Differential/Platelet    Other Visit Diagnoses     Vitamin D deficiency    -  Primary   Relevant Orders   VITAMIN D 25 Hydroxy (Vit-D Deficiency, Fractures)       Follow up plan: Return in about 6 months (around 10/12/2018) for PE.

## 2018-04-11 NOTE — Assessment & Plan Note (Signed)
Stable not on medications.  Will not restart at this time.  Explained to pt goal is to keep BP below 140/90.  She will check at home and again in the next 6 months

## 2018-04-11 NOTE — Assessment & Plan Note (Signed)
Doing well since adding Symbicort.  No SOB at this time and no exacerbations in the last 6 months.

## 2018-04-12 LAB — COMPREHENSIVE METABOLIC PANEL
A/G RATIO: 1.8 (ref 1.2–2.2)
ALT: 10 IU/L (ref 0–32)
AST: 16 IU/L (ref 0–40)
Albumin: 4.1 g/dL (ref 3.5–4.8)
Alkaline Phosphatase: 80 IU/L (ref 39–117)
BILIRUBIN TOTAL: 0.3 mg/dL (ref 0.0–1.2)
BUN/Creatinine Ratio: 13 (ref 12–28)
BUN: 11 mg/dL (ref 8–27)
CHLORIDE: 104 mmol/L (ref 96–106)
CO2: 26 mmol/L (ref 20–29)
Calcium: 9.1 mg/dL (ref 8.7–10.3)
Creatinine, Ser: 0.83 mg/dL (ref 0.57–1.00)
GFR calc Af Amer: 81 mL/min/{1.73_m2} (ref >59)
GFR calc non Af Amer: 71 mL/min/{1.73_m2} (ref >59)
Globulin, Total: 2.3 g/dL (ref 1.5–4.5)
Glucose: 80 mg/dL (ref 65–99)
POTASSIUM: 3.5 mmol/L (ref 3.5–5.2)
Sodium: 145 mmol/L — ABNORMAL HIGH (ref 134–144)
Total Protein: 6.4 g/dL (ref 6.0–8.5)

## 2018-04-12 LAB — CBC WITH DIFFERENTIAL/PLATELET
BASOS: 0 %
Basophils Absolute: 0 10*3/uL (ref 0.0–0.2)
EOS (ABSOLUTE): 0.1 10*3/uL (ref 0.0–0.4)
Eos: 3 %
Hematocrit: 35.3 % (ref 34.0–46.6)
Hemoglobin: 11.7 g/dL (ref 11.1–15.9)
IMMATURE GRANULOCYTES: 0 %
Immature Grans (Abs): 0 10*3/uL (ref 0.0–0.1)
Lymphocytes Absolute: 1.1 10*3/uL (ref 0.7–3.1)
Lymphs: 24 %
MCH: 30.7 pg (ref 26.6–33.0)
MCHC: 33.1 g/dL (ref 31.5–35.7)
MCV: 93 fL (ref 79–97)
MONOS ABS: 0.3 10*3/uL (ref 0.1–0.9)
Monocytes: 6 %
NEUTROS ABS: 3.1 10*3/uL (ref 1.4–7.0)
NEUTROS PCT: 67 %
PLATELETS: 184 10*3/uL (ref 150–379)
RBC: 3.81 x10E6/uL (ref 3.77–5.28)
RDW: 13.2 % (ref 12.3–15.4)
WBC: 4.7 10*3/uL (ref 3.4–10.8)

## 2018-04-12 LAB — LIPID PANEL W/O CHOL/HDL RATIO
Cholesterol, Total: 180 mg/dL (ref 100–199)
HDL: 55 mg/dL (ref >39)
LDL Calculated: 110 mg/dL — ABNORMAL HIGH (ref 0–99)
TRIGLYCERIDES: 77 mg/dL (ref 0–149)
VLDL Cholesterol Cal: 15 mg/dL (ref 5–40)

## 2018-04-12 LAB — VITAMIN D 25 HYDROXY (VIT D DEFICIENCY, FRACTURES): Vit D, 25-Hydroxy: 33.3 ng/mL (ref 30.0–100.0)

## 2018-04-12 NOTE — Progress Notes (Signed)
Notified pt by mychart

## 2018-10-15 ENCOUNTER — Ambulatory Visit: Payer: Medicare Other

## 2018-10-15 ENCOUNTER — Encounter: Payer: Medicare Other | Admitting: Unknown Physician Specialty

## 2018-10-25 ENCOUNTER — Ambulatory Visit (INDEPENDENT_AMBULATORY_CARE_PROVIDER_SITE_OTHER): Payer: Medicare Other

## 2018-10-25 ENCOUNTER — Encounter: Payer: Self-pay | Admitting: Nurse Practitioner

## 2018-10-25 ENCOUNTER — Encounter: Payer: Medicare Other | Admitting: Nurse Practitioner

## 2018-10-25 ENCOUNTER — Ambulatory Visit (INDEPENDENT_AMBULATORY_CARE_PROVIDER_SITE_OTHER): Payer: Medicare Other | Admitting: Nurse Practitioner

## 2018-10-25 VITALS — BP 138/70 | HR 65 | Temp 98.0°F | Ht 60.0 in | Wt 144.0 lb

## 2018-10-25 DIAGNOSIS — Z1239 Encounter for other screening for malignant neoplasm of breast: Secondary | ICD-10-CM

## 2018-10-25 DIAGNOSIS — J449 Chronic obstructive pulmonary disease, unspecified: Secondary | ICD-10-CM

## 2018-10-25 DIAGNOSIS — L918 Other hypertrophic disorders of the skin: Secondary | ICD-10-CM

## 2018-10-25 DIAGNOSIS — I10 Essential (primary) hypertension: Secondary | ICD-10-CM | POA: Diagnosis not present

## 2018-10-25 DIAGNOSIS — F332 Major depressive disorder, recurrent severe without psychotic features: Secondary | ICD-10-CM

## 2018-10-25 DIAGNOSIS — E785 Hyperlipidemia, unspecified: Secondary | ICD-10-CM | POA: Diagnosis not present

## 2018-10-25 DIAGNOSIS — F419 Anxiety disorder, unspecified: Secondary | ICD-10-CM

## 2018-10-25 DIAGNOSIS — Z0000 Encounter for general adult medical examination without abnormal findings: Secondary | ICD-10-CM

## 2018-10-25 MED ORDER — ZOSTER VAC RECOMB ADJUVANTED 50 MCG/0.5ML IM SUSR: 0.5000 mL | Freq: Once | INTRAMUSCULAR | 1 refills | Status: AC

## 2018-10-25 MED ORDER — BETAMETHASONE DIPROPIONATE AUG 0.05 % EX CREA: TOPICAL_CREAM | Freq: Two times a day (BID) | CUTANEOUS | 0 refills | Status: DC

## 2018-10-25 MED ORDER — HYDROXYZINE HCL 10 MG PO TABS: 10.0000 mg | ORAL_TABLET | Freq: Three times a day (TID) | ORAL | 0 refills | Status: DC | PRN

## 2018-10-25 NOTE — Patient Instructions (Signed)
Shelly Bridges , Thank you for taking time to come for your Medicare Wellness Visit. I appreciate your ongoing commitment to your health goals. Please review the following plan we discussed and let me know if I can assist you in the future.   Screening recommendations/referrals: Colonoscopy due, cologuard information filled out Mammogram due, ordered for 3 months out Bone Density up to date Recommended yearly ophthalmology/optometry visit for glaucoma screening and checkup Recommended yearly dental visit for hygiene and checkup  Vaccinations: Influenza vaccine up to date Pneumococcal vaccine up to date, completed Tdap vaccine up to date, due 06/04/2028 Shingles vaccine due, ordered to pharmacy    Advanced directives: Please bring [place] a copy once this is filled out  Conditions/risks identified: none  Next appointment: Medicare Wellness 10/28/2019 @ 8:15am   Preventive Care 65 Years and Older, Female Preventive care refers to lifestyle choices and visits with your health care provider that can promote health and wellness. What does preventive care include?  A yearly physical exam. This is also called an annual well check.  Dental exams once or twice a year.  Routine eye exams. Ask your health care provider how often you should have your eyes checked.  Personal lifestyle choices, including:  Daily care of your teeth and gums.  Regular physical activity.  Eating a healthy diet.  Avoiding tobacco and drug use.  Limiting alcohol use.  Practicing safe sex.  Taking low-dose aspirin every day.  Taking vitamin and mineral supplements as recommended by your health care provider. What happens during an annual well check? The services and screenings done by your health care provider during your annual well check will depend on your age, overall health, lifestyle risk factors, and family history of disease. Counseling  Your health care provider may ask you questions about  your:  Alcohol use.  Tobacco use.  Drug use.  Emotional well-being.  Home and relationship well-being.  Sexual activity.  Eating habits.  History of falls.  Memory and ability to understand (cognition).  Work and work Statistician.  Reproductive health. Screening  You may have the following tests or measurements:  Height, weight, and BMI.  Blood pressure.  Lipid and cholesterol levels. These may be checked every 5 years, or more frequently if you are over 40 years old.  Skin check.  Lung cancer screening. You may have this screening every year starting at age 58 if you have a 30-pack-year history of smoking and currently smoke or have quit within the past 15 years.  Fecal occult blood test (FOBT) of the stool. You may have this test every year starting at age 62.  Flexible sigmoidoscopy or colonoscopy. You may have a sigmoidoscopy every 5 years or a colonoscopy every 10 years starting at age 81.  Hepatitis C blood test.  Hepatitis B blood test.  Sexually transmitted disease (STD) testing.  Diabetes screening. This is done by checking your blood sugar (glucose) after you have not eaten for a while (fasting). You may have this done every 1-3 years.  Bone density scan. This is done to screen for osteoporosis. You may have this done starting at age 40.  Mammogram. This may be done every 1-2 years. Talk to your health care provider about how often you should have regular mammograms. Talk with your health care provider about your test results, treatment options, and if necessary, the need for more tests. Vaccines  Your health care provider may recommend certain vaccines, such as:  Influenza vaccine. This is recommended every  year.  Tetanus, diphtheria, and acellular pertussis (Tdap, Td) vaccine. You may need a Td booster every 10 years.  Zoster vaccine. You may need this after age 10.  Pneumococcal 13-valent conjugate (PCV13) vaccine. One dose is recommended  after age 61.  Pneumococcal polysaccharide (PPSV23) vaccine. One dose is recommended after age 53. Talk to your health care provider about which screenings and vaccines you need and how often you need them. This information is not intended to replace advice given to you by your health care provider. Make sure you discuss any questions you have with your health care provider. Document Released: 12/18/2015 Document Revised: 08/10/2016 Document Reviewed: 09/22/2015 Elsevier Interactive Patient Education  2017 [place] Prevention in the Home Falls can cause injuries. They can happen to people of all ages. There are many things you can do to make your home safe and to help prevent falls. What can I do on the outside of my home?  Regularly fix the edges of walkways and driveways and fix any cracks.  Remove anything that might make you trip as you walk through a door, such as a raised step or threshold.  Trim any bushes or trees on the path to your home.  Use bright outdoor lighting.  Clear any walking paths of anything that might make someone trip, such as rocks or tools.  Regularly check to see if handrails are loose or broken. Make sure that both sides of any steps have handrails.  Any raised decks and porches should have guardrails on the edges.  Have any leaves, snow, or ice cleared regularly.  Use sand or salt on walking paths during winter.  Clean up any spills in your garage right away. This includes oil or grease spills. What can I do in the bathroom?  Use night lights.  Install grab bars by the toilet and in the tub and shower. Do not use towel bars as grab bars.  Use non-skid mats or decals in the tub or shower.  If you need to sit down in the shower, use a plastic, non-slip stool.  Keep the floor dry. Clean up any water that spills on the floor as soon as it happens.  Remove soap buildup in the tub or shower regularly.  Attach bath mats securely with  double-sided non-slip rug tape.  Do not have throw rugs and other things on the floor that can make you trip. What can I do in the bedroom?  Use night lights.  Make sure that you have a light by your bed that is easy to reach.  Do not use any sheets or blankets that are too big for your bed. They should not hang down onto the floor.  Have a firm chair that has side arms. You can use this for support while you get dressed.  Do not have throw rugs and other things on the floor that can make you trip. What can I do in the kitchen?  Clean up any spills right away.  Avoid walking on wet floors.  Keep items that you use a lot in easy-to-reach places.  If you need to reach something above you, use a strong step stool that has a grab bar.  Keep electrical cords out of the way.  Do not use floor polish or wax that makes floors slippery. If you must use wax, use non-skid floor wax.  Do not have throw rugs and other things on the floor that can make you trip. What can  I do with my stairs?  Do not leave any items on the stairs.  Make sure that there are handrails on both sides of the stairs and use them. Fix handrails that are broken or loose. Make sure that handrails are as long as the stairways.  Check any carpeting to make sure that it is firmly attached to the stairs. Fix any carpet that is loose or worn.  Avoid having throw rugs at the top or bottom of the stairs. If you do have throw rugs, attach them to the floor with carpet tape.  Make sure that you have a light switch at the top of the stairs and the bottom of the stairs. If you do not have them, ask someone to add them for you. What else can I do to help prevent falls?  Wear shoes that:  Do not have high heels.  Have rubber bottoms.  Are comfortable and fit you well.  Are closed at the toe. Do not wear sandals.  If you use a stepladder:  Make sure that it is fully opened. Do not climb a closed stepladder.  Make  sure that both sides of the stepladder are locked into place.  Ask someone to hold it for you, if possible.  Clearly mark and make sure that you can see:  Any grab bars or handrails.  First and last steps.  Where the edge of each step is.  Use tools that help you move around (mobility aids) if they are needed. These include:  Canes.  Walkers.  Scooters.  Crutches.  Turn on the lights when you go into a dark area. Replace any light bulbs as soon as they burn out.  Set up your furniture so you have a clear path. Avoid moving your furniture around.  If any of your floors are uneven, fix them.  If there are any pets around you, be aware of where they are.  Review your medicines with your doctor. Some medicines can make you feel dizzy. This can increase your chance of falling. Ask your doctor what other things that you can do to help prevent falls. This information is not intended to replace advice given to you by your health care provider. Make sure you discuss any questions you have with your health care provider. Document Released: 09/17/2009 Document Revised: 04/28/2016 Document Reviewed: 12/26/2014 Elsevier Interactive Patient Education  2017 [employer].

## 2018-10-25 NOTE — Assessment & Plan Note (Signed)
Chronic, stable. Encouraged to use Vistaril as needed.

## 2018-10-25 NOTE — Assessment & Plan Note (Signed)
Chronic, stable.  Continue current medications.

## 2018-10-25 NOTE — Assessment & Plan Note (Signed)
Chronic, ongoing. Improved with Symbicort.  Minimal use of rescue inhaler and no recent exacerbation.

## 2018-10-25 NOTE — Assessment & Plan Note (Signed)
Chronic, stable without medication.  Lipid panel today.

## 2018-10-25 NOTE — Progress Notes (Signed)
Established Patient Office Visit  Subjective:  Patient ID: Shelly Bridges, female    DOB: [birth year]  Age: 72 y.o. MRN: [id number]  CC:  Chief Complaint  Patient presents with  . Annual Exam    HPI Shelly Bridges presents for annual physical exam.  SKIN TAG RIGHT CLAVICLE: Noted on exam, patient reports she does not perform skin assessments; so has not noted color change.  Is agreeable to referral to dermatology.  HYPERTENSION / HYPERLIPIDEMIA Not currently on medication.  At one time she was on medication, but has not been on it "for long time".  Has remained below her goal 140/90 at last few visits.  Focuses on diet at home.  Reports she has "gone to gym some", most often gets exercise at home.    COPD Reports Symbicort has improved COPD a lot. COPD status: controlled Satisfied with current treatment?: yes Oxygen use: no Dyspnea frequency:  Cough frequency:  Rescue inhaler frequency: used 3-4 times over past three months  Limitation of activity: no Productive cough:  Last Spirometry:  Pneumovax: unknown Influenza: Up to Date  DEPRESSION/ANXIETY Takes Sertraline daily.  Use Vistaril intermittently, has not used in 6-8 months.   Mood status: stable Satisfied with current treatment?: yes Symptom severity: mild  Duration of current treatment : chronic Side effects: no Medication compliance: excellent compliance Psychotherapy/counseling: no none Previous psychiatric medications: none Depressed mood: no Anxious mood: no Anhedonia: no Significant weight loss or gain: no Insomnia: no insomnia Fatigue: no Feelings of worthlessness or guilt: no Impaired concentration/indecisiveness: no Suicidal ideations: no Hopelessness: no Crying spells: occasionally d/t family stressors Depression screen [facility] 2/9 10/25/2018 04/11/2018 10/11/2017 10/11/2017 07/07/2017  Decreased Interest 1 0 0 0 1  Down, Depressed, Hopeless 1 0 0 0 0  PHQ - 2 Score 2 0 0 0 1  Altered  sleeping 1 1 0 0 -  Tired, decreased energy 1 1 0 0 -  Change in appetite 0 1 0 0 -  Feeling bad or failure about yourself  1 0 0 0 -  Trouble concentrating 0 0 0 0 -  Moving slowly or fidgety/restless 0 0 0 0 -  Suicidal thoughts 0 0 0 0 -  PHQ-9 Score 5 3 0 0 -  Difficult doing work/chores Somewhat difficult - - - -   GAD 7 : Generalized Anxiety Score 10/26/2015  Nervous, Anxious, on Edge 3  Control/stop worrying 3  Worry too much - different things 3  Trouble relaxing 1  Restless 1  Easily annoyed or irritable 1  Afraid - awful might happen 1  Total GAD 7 Score 13  Anxiety Difficulty Somewhat difficult          Past Medical History:  Diagnosis Date  . Anxiety   . Asthma   . Lipoma   . Neutropenia ([place])   . Osteoporosis   . Vitamin D deficiency disease     History reviewed. No pertinent surgical history.  Family History  Problem Relation Age of Onset  . Dementia Mother   . Hypertension Mother   . Osteoporosis Mother   . Stroke Mother        mini stroke  . Cancer Father        brain tumor and lung  . Hypertension Father   . Emphysema Father   . COPD Neg Hx   . Heart disease Neg Hx     Social History   Socioeconomic History  . Marital status: Divorced  Spouse name: Not on file  . Number of children: 3  . Years of education: Not on file  . Highest education level: Not on file  Occupational History  . Not on file  Social Needs  . Financial resource strain: Not hard at all  . Food insecurity:    Worry: Never true    Inability: Never true  . Transportation needs:    Medical: No    Non-medical: No  Tobacco Use  . Smoking status: Former Smoker    Packs/day: 1.00    Years: 20.00    Pack years: 20.00    Types: Cigarettes    Last attempt to quit: 06/17/1985    Years since quitting: 33.3  . Smokeless tobacco: Never Used  Substance and Sexual Activity  . Alcohol use: No  . Drug use: No  . Sexual activity: Never  Lifestyle  . Physical activity:     Days per week: 0 days    Minutes per session: 0 min  . Stress: Only a little  Relationships  . Social connections:    Talks on phone: More than three times a week    Gets together: More than three times a week    Attends religious service: Never    Active member of club or organization: No    Attends meetings of clubs or organizations: Never    Relationship status: Divorced  . Intimate partner violence:    Fear of current or ex partner: No    Emotionally abused: No    Physically abused: No    Forced sexual activity: No  Other Topics Concern  . Not on file  Social History Narrative  . Not on file    Outpatient Medications Prior to Visit  Medication Sig Dispense Refill  . albuterol (PROVENTIL HFA;VENTOLIN HFA) 108 (90 Base) MCG/ACT inhaler INHALE 2 PUFFS INTO THE LUNGS EVERY 4 HOURS AS NEEDED FOR WHEEZING OR SHORTNESS OF BREATH 42.5 g 1  . budesonide-formoterol (SYMBICORT) 160-4.5 MCG/ACT inhaler Inhale 2 puffs into the lungs 2 (two) times daily. 1 Inhaler 12  . sertraline (ZOLOFT) 100 MG tablet Take 1 tablet (100 mg total) by mouth daily. 90 tablet 3  . Zoster Vaccine Adjuvanted [facility]) injection Inject 0.5 mLs into the muscle once for 1 dose. 0.5 mL 1  . augmented betamethasone dipropionate (DIPROLENE AF) 0.05 % cream Apply topically 2 (two) times daily. 30 g 0  . hydrOXYzine (ATARAX/VISTARIL) 10 MG tablet Take 1 tablet (10 mg total) 3 (three) times daily as needed by mouth. (Patient not taking: Reported on 04/11/2018) 30 tablet 0   No facility-administered medications prior to visit.     No Known Allergies  ROS Review of Systems  Constitutional: Negative for activity change, appetite change, fatigue, fever and unexpected weight change.  HENT: Negative.   Eyes: Negative.   Respiratory: Negative for cough, chest tightness, shortness of breath and wheezing.   Cardiovascular: Negative for chest pain, palpitations and leg swelling.  Gastrointestinal: Negative for abdominal  distention, abdominal pain, constipation, diarrhea, nausea and vomiting.  Endocrine: Negative.   Genitourinary: Negative.   Musculoskeletal: Negative.   Skin: Negative.   Allergic/Immunologic: Negative.   Neurological: Negative for dizziness, numbness and headaches.  Hematological: Negative.   Psychiatric/Behavioral: Negative for behavioral problems, confusion, decreased concentration, sleep disturbance and suicidal ideas. The patient is nervous/anxious.      Objective:    Physical Exam  Constitutional: She is oriented to person, place, and time. She appears well-developed and well-nourished.  HENT:  Head: Normocephalic and atraumatic.  Right Ear: Tympanic membrane, external ear and ear canal normal.  Left Ear: Hearing, tympanic membrane, external ear and ear canal normal.  Nose: Nose normal.  Mouth/Throat: Uvula is midline, oropharynx is clear and moist and mucous membranes are normal.  Eyes: Pupils are equal, round, and reactive to light. Conjunctivae and EOM are normal. Right eye exhibits no discharge. Left eye exhibits no discharge.  Neck: Normal range of motion. Neck supple. No JVD present. Carotid bruit is not present. No thyromegaly present.  Cardiovascular: Normal rate, regular rhythm and normal heart sounds. Exam reveals no gallop.  No murmur heard. Pulmonary/Chest: Effort normal and breath sounds normal. Right breast exhibits no inverted nipple, no mass, no nipple discharge, no skin change and no tenderness. Left breast exhibits no inverted nipple, no mass, no nipple discharge, no skin change and no tenderness.  Abdominal: Soft. Normal appearance and bowel sounds are normal. There is no hepatosplenomegaly.  Musculoskeletal: Normal range of motion.  Lymphadenopathy:    She has no cervical adenopathy.  Neurological: She is alert and oriented to person, place, and time. She has normal strength and normal reflexes. No cranial nerve deficit. She displays a negative Romberg sign.    Skin: Skin is warm and dry.     Psychiatric: She has a normal mood and affect. Her speech is normal and behavior is normal. Judgment and thought content normal. Cognition and memory are normal.  Nursing note and vitals reviewed.   BP 138/70   Pulse 65   Temp 98 F (36.7 C) (Oral)   Ht 5' (1.524 m)   Wt 144 lb (65.3 kg)   SpO2 96%   BMI 28.12 kg/m  Wt Readings from Last 3 Encounters:  10/25/18 144 lb (65.3 kg)  10/25/18 144 lb (65.3 kg)  04/11/18 147 lb 12.8 oz (67 kg)     Health Maintenance Due  Topic Date Due  . COLONOSCOPY  02/19/1996  . MAMMOGRAM  09/03/2010    There are no preventive care reminders to display for this patient.  Lab Results  Component Value Date   TSH 2.470 09/30/2016   Lab Results  Component Value Date   WBC 4.7 04/11/2018   HGB 11.7 04/11/2018   HCT 35.3 04/11/2018   MCV 93 04/11/2018   PLT 184 04/11/2018   Lab Results  Component Value Date   NA 145 (H) 04/11/2018   K 3.5 04/11/2018   CO2 26 04/11/2018   GLUCOSE 80 04/11/2018   BUN 11 04/11/2018   CREATININE 0.83 04/11/2018   BILITOT 0.3 04/11/2018   ALKPHOS 80 04/11/2018   AST 16 04/11/2018   ALT 10 04/11/2018   PROT 6.4 04/11/2018   ALBUMIN 4.1 04/11/2018   CALCIUM 9.1 04/11/2018   Lab Results  Component Value Date   CHOL 180 04/11/2018   Lab Results  Component Value Date   HDL 55 04/11/2018   Lab Results  Component Value Date   LDLCALC 110 (H) 04/11/2018   Lab Results  Component Value Date   TRIG 77 04/11/2018   No results found for: CHOLHDL No results found for: HGBA1C    Assessment & Plan:   Problem List Items Addressed This Visit      Cardiovascular and Mediastinum   Essential hypertension, benign    Stable without medication.  BP below 140/90 goal.  Continue to monitor.  CMP today.      Relevant Orders   CBC with Differential/Platelet   Comprehensive metabolic panel  Respiratory   COPD, severe (HCC)    Chronic, ongoing. Improved with  Symbicort.  Minimal use of rescue inhaler and no recent exacerbation.        Musculoskeletal and Integument   Skin tag    Small skin tag to right clavicle area with slight pearly discoloration to upper aspect.  Dermatology consult placed.      Relevant Orders   Ambulatory referral to Dermatology     Other   Anxiety    Chronic, stable. Encouraged to use Vistaril as needed.      Relevant Medications   hydrOXYzine (ATARAX/VISTARIL) 10 MG tablet   Major depression    Chronic, stable.  Continue current medications.      Relevant Medications   hydrOXYzine (ATARAX/VISTARIL) 10 MG tablet   Hyperlipidemia    Chronic, stable without medication.  Lipid panel today.      Relevant Orders   Comprehensive metabolic panel   Lipid Profile    Other Visit Diagnoses    Annual physical exam    -  Primary   Relevant Orders   TSH   VITAMIN D 25 Hydroxy (Vit-D Deficiency, Fractures)      Meds ordered this encounter  Medications  . hydrOXYzine (ATARAX/VISTARIL) 10 MG tablet    Sig: Take 1 tablet (10 mg total) by mouth 3 (three) times daily as needed.    Dispense:  30 tablet    Refill:  0  . augmented betamethasone dipropionate (DIPROLENE AF) 0.05 % cream    Sig: Apply topically 2 (two) times daily.    Dispense:  30 g    Refill:  0    Follow-up: Return in about 6 months (around 04/25/2019) for HTN/HLD, depression.    Venita Lick, NP

## 2018-10-25 NOTE — Assessment & Plan Note (Signed)
Small skin tag to right clavicle area with slight pearly discoloration to upper aspect.  Dermatology consult placed.

## 2018-10-25 NOTE — Progress Notes (Signed)
Subjective:   Shelly Bridges is a 72 y.o. female who presents for Medicare Annual (Subsequent) preventive examination.     Objective:     Vitals: BP 138/70 (BP Location: Left Arm, Patient Position: Sitting)   Pulse 65   Temp 98 F (36.7 C) (Oral)   Ht 5' (1.524 m)   Wt 144 lb (65.3 kg)   SpO2 96%   BMI 28.12 kg/m   Body mass index is 28.12 kg/m.  Advanced Directives 10/25/2018 10/11/2017 09/30/2016  Does Patient Have a Medical Advance Directive? Yes Yes No  Type of Paramedic of [place];Living will - -  Does patient want to make changes to medical advance directive? Yes (MAU/Ambulatory/Procedural Areas - Information given) Yes (MAU/Ambulatory/Procedural Areas - Information given) -  Copy of [place] in Chart? No - copy requested - -    Tobacco Social History   Tobacco Use  Smoking Status Former Smoker  . Packs/day: 1.00  . Years: 20.00  . Pack years: 20.00  . Types: Cigarettes  . Last attempt to quit: 06/17/1985  . Years since quitting: 33.3  Smokeless Tobacco Never Used     Counseling given: Not Answered   Clinical Intake:  Pre-visit preparation completed: No  Pain : No/denies pain     Diabetes: No  How often do you need to have someone help you when you read instructions, pamphlets, or other written materials from your doctor or pharmacy?: 1 - Never What is the last grade level you completed in school?: Some college  Interpreter Needed?: No  Information entered by :: Tyson Dense, RN  Past Medical History:  Diagnosis Date  . Anxiety   . Asthma   . Lipoma   . Neutropenia ([place])   . Osteoporosis   . Vitamin D deficiency disease    History reviewed. No pertinent surgical history. Family History  Problem Relation Age of Onset  . Dementia Mother   . Hypertension Mother   . Osteoporosis Mother   . Stroke Mother        mini stroke  . Cancer Father        brain tumor and lung  . Hypertension  Father   . Emphysema Father   . COPD Neg Hx   . Heart disease Neg Hx    Social History   Socioeconomic History  . Marital status: Divorced    Spouse name: Not on file  . Number of children: 3  . Years of education: Not on file  . Highest education level: Not on file  Occupational History  . Not on file  Social Needs  . Financial resource strain: Not hard at all  . Food insecurity:    Worry: Never true    Inability: Never true  . Transportation needs:    Medical: No    Non-medical: No  Tobacco Use  . Smoking status: Former Smoker    Packs/day: 1.00    Years: 20.00    Pack years: 20.00    Types: Cigarettes    Last attempt to quit: 06/17/1985    Years since quitting: 33.3  . Smokeless tobacco: Never Used  Substance and Sexual Activity  . Alcohol use: No  . Drug use: No  . Sexual activity: Never  Lifestyle  . Physical activity:    Days per week: 0 days    Minutes per session: 0 min  . Stress: Only a little  Relationships  . Social connections:    Talks on  phone: More than three times a week    Gets together: More than three times a week    Attends religious service: Never    Active member of club or organization: No    Attends meetings of clubs or organizations: Never    Relationship status: Divorced  Other Topics Concern  . Not on file  Social History Narrative  . Not on file    Outpatient Encounter Medications as of 10/25/2018  Medication Sig  . albuterol (PROVENTIL HFA;VENTOLIN HFA) 108 (90 Base) MCG/ACT inhaler INHALE 2 PUFFS INTO THE LUNGS EVERY 4 HOURS AS NEEDED FOR WHEEZING OR SHORTNESS OF BREATH  . augmented betamethasone dipropionate (DIPROLENE AF) 0.05 % cream Apply topically 2 (two) times daily.  . budesonide-formoterol (SYMBICORT) 160-4.5 MCG/ACT inhaler Inhale 2 puffs into the lungs 2 (two) times daily.  . sertraline (ZOLOFT) 100 MG tablet Take 1 tablet (100 mg total) by mouth daily.  Marland Kitchen Zoster Vaccine Adjuvanted [facility]) injection Inject 0.5 mLs  into the muscle once for 1 dose.  . [DISCONTINUED] Zoster Vaccine Adjuvanted [facility]) injection Inject 0.5 mLs into the muscle once.  . hydrOXYzine (ATARAX/VISTARIL) 10 MG tablet Take 1 tablet (10 mg total) 3 (three) times daily as needed by mouth. (Patient not taking: Reported on 04/11/2018)   No facility-administered encounter medications on file as of 10/25/2018.     Activities of Daily Living In your present state of health, do you have any difficulty performing the following activities: 10/25/2018  Hearing? N  Vision? N  Difficulty concentrating or making decisions? N  Walking or climbing stairs? N  Dressing or bathing? N  Doing errands, shopping? N  Preparing Food and eating ? N  Using the Toilet? N  In the past six months, have you accidently leaked urine? N  Do you have problems with loss of bowel control? N  Managing your Medications? N  Managing your Finances? N  Housekeeping or managing your Housekeeping? N  Some recent data might be hidden    Patient Care Team: Venita Lick, NP as PCP - General (Nurse Practitioner)    Assessment:   This is a routine wellness examination for Shelly Bridges.  Exercise Activities and Dietary recommendations Current Exercise Habits: The patient does not participate in regular exercise at present, Exercise limited by: None identified  Goals    . Increase water intake     Recommend drinking at least 4-5 glasses of water a day       Fall Risk Fall Risk  10/25/2018 04/11/2018 10/11/2017 10/11/2017 07/07/2017  Falls in the past year? 0 No No No No  Number falls in past yr: 0 - - - -  Injury with Fall? 0 - - - -   Is the patient's home free of loose throw rugs in walkways, pet beds, electrical cords, etc?   yes      Grab bars in the bathroom? yes      Handrails on the stairs?   yes      Adequate lighting?   yes  Depression Screen PHQ 2/9 Scores 10/25/2018 04/11/2018 10/11/2017 10/11/2017  PHQ - 2 Score 2 0 0 0  PHQ- 9 Score 5 3 0 0      Cognitive Function     6CIT Screen 10/25/2018 10/11/2017  What Year? 0 points 0 points  What month? 0 points 0 points  What time? 0 points 0 points  Count back from 20 0 points 0 points  Months in reverse 0 points 0 points  Repeat phrase 0 points 2 points  Total Score 0 2    Immunization History  Administered Date(s) Administered  . Influenza, High Dose Seasonal PF 10/25/2016, 10/11/2017, 10/05/2018  . Influenza,inj,Quad PF,6+ Mos 10/26/2015  . Pneumococcal Conjugate-13 07/03/2014  . Pneumococcal Polysaccharide-23 10/26/2015  . Td 12/05/2008  . Tdap 06/04/2018    Qualifies for Shingles Vaccine? Yes, educated and ordered to pharmacy  Screening Tests Health Maintenance  Topic Date Due  . COLONOSCOPY  02/19/1996  . MAMMOGRAM  09/03/2010  . TETANUS/TDAP  06/04/2028  . INFLUENZA VACCINE  Completed  . DEXA SCAN  Completed  . Hepatitis C Screening  Completed  . PNA vac Low Risk Adult  Completed    Cancer Screenings: Lung: Low Dose CT Chest recommended if Age 43-80 years, 30 pack-year currently smoking OR have quit w/in 15years. Patient does not qualify. Breast:  Up to date on Mammogram? No, ordered Up to date of Bone Density/Dexa? Yes Colorectal: due, cologuard information filled out and faxed  Additional Screenings: Hepatitis C Screening: declined    Plan:  I have personally reviewed and addressed the Medicare Annual Wellness questionnaire and have noted the following in the patient's chart:  A. Medical and social history B. Use of alcohol, tobacco or illicit drugs  C. Current medications and supplements D. Functional ability and status E.  Nutritional status F.  Physical activity G. Advance directives H. List of other physicians I.  Hospitalizations, surgeries, and ER visits in previous 12 months J.  [place] to include hearing, vision, cognitive, depression L. Referrals and appointments - none  In addition, I have reviewed and discussed with  patient certain preventive protocols, quality metrics, and best practice recommendations. A written personalized care plan for preventive services as well as general preventive health recommendations were provided to patient.  See attached scanned questionnaire for additional information.   Signed,   Tyson Dense, RN Nurse Health Advisor  Patient Concerns:

## 2018-10-25 NOTE — Patient Instructions (Signed)

## 2018-10-25 NOTE — Assessment & Plan Note (Signed)
Stable without medication.  BP below 140/90 goal.  Continue to monitor.  CMP today.

## 2018-10-26 ENCOUNTER — Encounter: Payer: Medicare Other | Admitting: Unknown Physician Specialty

## 2018-10-26 LAB — CBC WITH DIFFERENTIAL/PLATELET
Basophils Absolute: 0.1 10*3/uL (ref 0.0–0.2)
Basos: 1 %
EOS (ABSOLUTE): 0.1 10*3/uL (ref 0.0–0.4)
EOS: 4 %
HEMATOCRIT: 38.3 % (ref 34.0–46.6)
HEMOGLOBIN: 12.8 g/dL (ref 11.1–15.9)
IMMATURE GRANS (ABS): 0 10*3/uL (ref 0.0–0.1)
IMMATURE GRANULOCYTES: 0 %
LYMPHS: 33 %
Lymphocytes Absolute: 1.2 10*3/uL (ref 0.7–3.1)
MCH: 31.8 pg (ref 26.6–33.0)
MCHC: 33.4 g/dL (ref 31.5–35.7)
MCV: 95 fL (ref 79–97)
MONOCYTES: 9 %
Monocytes Absolute: 0.3 10*3/uL (ref 0.1–0.9)
NEUTROS PCT: 53 %
Neutrophils Absolute: 1.9 10*3/uL (ref 1.4–7.0)
Platelets: 174 10*3/uL (ref 150–450)
RBC: 4.03 x10E6/uL (ref 3.77–5.28)
RDW: 11.6 % — ABNORMAL LOW (ref 12.3–15.4)
WBC: 3.6 10*3/uL (ref 3.4–10.8)

## 2018-10-26 LAB — COMPREHENSIVE METABOLIC PANEL
ALBUMIN: 4 g/dL (ref 3.5–4.8)
ALK PHOS: 85 IU/L (ref 39–117)
ALT: 7 IU/L (ref 0–32)
AST: 15 IU/L (ref 0–40)
Albumin/Globulin Ratio: 1.5 (ref 1.2–2.2)
BILIRUBIN TOTAL: 0.6 mg/dL (ref 0.0–1.2)
BUN/Creatinine Ratio: 16 (ref 12–28)
BUN: 14 mg/dL (ref 8–27)
CHLORIDE: 99 mmol/L (ref 96–106)
CO2: 25 mmol/L (ref 20–29)
Calcium: 9.3 mg/dL (ref 8.7–10.3)
Creatinine, Ser: 0.9 mg/dL (ref 0.57–1.00)
GFR calc non Af Amer: 64 mL/min/{1.73_m2} (ref >59)
GFR, EST AFRICAN AMERICAN: 74 mL/min/{1.73_m2} (ref >59)
GLUCOSE: 85 mg/dL (ref 65–99)
Globulin, Total: 2.6 g/dL (ref 1.5–4.5)
Potassium: 3.9 mmol/L (ref 3.5–5.2)
SODIUM: 139 mmol/L (ref 134–144)
TOTAL PROTEIN: 6.6 g/dL (ref 6.0–8.5)

## 2018-10-26 LAB — LIPID PANEL
CHOL/HDL RATIO: 3.4 ratio (ref 0.0–4.4)
Cholesterol, Total: 209 mg/dL — ABNORMAL HIGH (ref 100–199)
HDL: 62 mg/dL (ref >39)
LDL Calculated: 135 mg/dL — ABNORMAL HIGH (ref 0–99)
Triglycerides: 61 mg/dL (ref 0–149)
VLDL Cholesterol Cal: 12 mg/dL (ref 5–40)

## 2018-10-26 LAB — VITAMIN D 25 HYDROXY (VIT D DEFICIENCY, FRACTURES): VIT D 25 HYDROXY: 20.3 ng/mL — AB (ref 30.0–100.0)

## 2018-10-26 LAB — TSH: TSH: 3.17 u[IU]/mL (ref 0.450–4.500)

## 2018-11-01 ENCOUNTER — Encounter: Payer: Medicare Other | Admitting: Nurse Practitioner

## 2018-11-29 ENCOUNTER — Ambulatory Visit: Payer: Self-pay

## 2018-11-29 ENCOUNTER — Other Ambulatory Visit: Payer: Self-pay | Admitting: Nurse Practitioner

## 2018-11-29 ENCOUNTER — Telehealth: Payer: Self-pay | Admitting: Nurse Practitioner

## 2018-11-29 NOTE — Telephone Encounter (Signed)
Pt c/o cough and chest congestion for 3 days. Pt with a h/o COPD and asthma. Pt stated that she gets SOB after coughing. Pt is coughing up clear phlegm. Pt is hoarse, having post nasal drip and states her chest feels "tight." Pt has been using her inhalers and taking cough syrup but is not getting relief. Pt is in [place] state and is wanting steroids called in to a local pharmacy. Advised pt that she will need to be evaluated before being prescribed medication. Pt stated that she was seen by her PCP 10/25/18 and that "they have called in medication before without being seen" and "they know me there."  Called PCP office and office is closed for lunch.  Advised pt that NT will send a note back to the office and for the pt to call back to see if a prescription will be called in. Advised pt that if she has not heard anything from the office, she needs to go to the nearest [facility] for evaluation. Advised pt not to wait.  Pharmacy: [facility]  [address] [place]. Phone number is [phone].   Reason for Disposition . [1] Known COPD or other severe lung disease (i.e., bronchiectasis, cystic fibrosis, lung surgery) AND [2] worsening symptoms (i.e., increased sputum purulence or amount, increased breathing difficulty  Answer Assessment - Initial Assessment Questions 1. ONSET: "When did the cough begin?"      3 days ago 2. SEVERITY: "How bad is the cough today?"      Frequent deep coughing episodes 3. RESPIRATORY DISTRESS: "Describe your breathing."      SOB after coughing- cough is getting worse 4. FEVER: "Do you have a fever?" If so, ask: "What is your temperature, how was it measured, and when did it start?"     no 5. SPUTUM: "Describe the color of your sputum" (clear, white, yellow, green)     clear 6. HEMOPTYSIS: "Are you coughing up any blood?" If so ask: "How much?" (flecks, streaks, tablespoons, etc.)     no 7. CARDIAC HISTORY: "Do you have any history of heart disease?"  (e.g., heart attack, congestive heart failure)      No 8. LUNG HISTORY: "Do you have any history of lung disease?"  (e.g., pulmonary embolus, asthma, emphysema)     COPD, asthma 9. PE RISK FACTORS: "Do you have a history of blood clots?" (or: recent major surgery, recent prolonged travel, bedridden)     no 10. OTHER SYMPTOMS: "Do you have any other symptoms?" (e.g., runny nose, wheezing, chest pain)       Hoarse, post nasal drip, chest feels tight 11. PREGNANCY: "Is there any chance you are pregnant?" "When was your last menstrual period?"       n/a 12. TRAVEL: "Have you traveled out of the country in the last month?" (e.g., travel history, exposures)       no  Protocols used: [place]

## 2018-11-29 NOTE — Telephone Encounter (Signed)
complete

## 2018-11-29 NOTE — Telephone Encounter (Signed)
Patient is in [place] visiting her daughter and new grand baby, unable to get to Urgent Care at this time and is requesting Prednisone be sent into [facility] in [place] for cough x 3 days.  Spoke to patient via telephone, states providers have called in for her before when traveling.  Will call in Prednisone 40 MG daily x 5 days.  Recommended that if continued cough or worsening symptoms patient to immediately go to urgent care locally or ER and obtain CXR and possible abx therapy.  She was able to verbalize back instructions and reported she would do this.  Is using inhalers as ordered at this time.  Denies fever, SOB, CP.  Prescription called into [facility] in [place] via phone number provided.

## 2018-12-03 ENCOUNTER — Encounter: Payer: Self-pay | Admitting: Nurse Practitioner

## 2018-12-19 ENCOUNTER — Other Ambulatory Visit: Payer: Self-pay | Admitting: Unknown Physician Specialty

## 2018-12-19 NOTE — Telephone Encounter (Signed)
Requested medication (s) are due for refill today -yes  Requested medication (s) are on the active medication list -yes  Future visit scheduled -yes  Last refill: 04/11/18  Notes to clinic: patient has changed PCP to [facility]- please change name on Rx for refills. Protocol met.  Requested Prescriptions  Pending Prescriptions Disp Refills   SYMBICORT 160-4.5 MCG/ACT inhaler [Pharmacy Med Name: SYMBICORT 160/4.5MCG (120 ORAL INH)] 10.2 g     Sig: INHALE 2 PUFFS INTO THE LUNGS TWICE DAILY     Pulmonology:  Combination Products Passed - 12/19/2018 10:08 AM      Passed - Valid encounter within last 12 months    Recent Outpatient Visits          1 month ago Annual physical exam   [place], Barbaraann Faster, NP   8 months ago Vitamin D deficiency   [place] Kathrine Haddock, NP   1 year ago Need for influenza vaccination   [facility] Kathrine Haddock, NP   1 year ago COPD, severe ([place])   [place] Kathrine Haddock, NP   1 year ago Acute exacerbation of chronic bronchitis ([place])   [place] Kathrine Haddock, NP      Future Appointments            In 4 months [place], Barbaraann Faster, NP [facility]   In 68 months  [facility]            Requested Prescriptions  Pending Prescriptions Disp Refills   SYMBICORT 160-4.5 MCG/ACT inhaler [Pharmacy Med Name: SYMBICORT 160/4.5MCG (120 ORAL INH)] 10.2 g     Sig: INHALE 2 PUFFS INTO THE LUNGS TWICE DAILY     Pulmonology:  Combination Products Passed - 12/19/2018 10:08 AM      Passed - Valid encounter within last 12 months    Recent Outpatient Visits          1 month ago Annual physical exam   [place], Barbaraann Faster, NP   8 months ago Vitamin D deficiency   [place] Kathrine Haddock, NP   1 year ago Need for influenza vaccination   [facility] Kathrine Haddock, NP   1 year ago COPD, severe  [facility])   [facility] Kathrine Haddock, NP   1 year ago Acute exacerbation of chronic bronchitis ([place])   [place] Kathrine Haddock, NP      Future Appointments            In 4 months Cannady, Barbaraann Faster, NP [facility]   In 20 months  [facility] [place]

## 2018-12-19 NOTE — Telephone Encounter (Signed)
Refill request for inhaler approved.

## 2019-02-22 ENCOUNTER — Other Ambulatory Visit: Payer: Self-pay | Admitting: Nurse Practitioner

## 2019-02-22 MED ORDER — ALBUTEROL SULFATE HFA 108 (90 BASE) MCG/ACT IN AERS: INHALATION_SPRAY | RESPIRATORY_TRACT | 1 refills | Status: DC

## 2019-02-22 NOTE — Progress Notes (Signed)
Patient request refill on Albuterol, script sent.

## 2019-04-26 ENCOUNTER — Ambulatory Visit: Payer: Medicare Other | Admitting: Nurse Practitioner

## 2019-04-30 ENCOUNTER — Other Ambulatory Visit: Payer: Self-pay

## 2019-04-30 ENCOUNTER — Ambulatory Visit (INDEPENDENT_AMBULATORY_CARE_PROVIDER_SITE_OTHER): Payer: Medicare Other | Admitting: Nurse Practitioner

## 2019-04-30 ENCOUNTER — Encounter: Payer: Self-pay | Admitting: Nurse Practitioner

## 2019-04-30 VITALS — BP 117/69 | HR 66 | Temp 97.7°F | Wt 145.8 lb

## 2019-04-30 DIAGNOSIS — E7801 Familial hypercholesterolemia: Secondary | ICD-10-CM | POA: Diagnosis not present

## 2019-04-30 DIAGNOSIS — J410 Simple chronic bronchitis: Secondary | ICD-10-CM | POA: Diagnosis not present

## 2019-04-30 DIAGNOSIS — F419 Anxiety disorder, unspecified: Secondary | ICD-10-CM

## 2019-04-30 DIAGNOSIS — I10 Essential (primary) hypertension: Secondary | ICD-10-CM

## 2019-04-30 DIAGNOSIS — F332 Major depressive disorder, recurrent severe without psychotic features: Secondary | ICD-10-CM

## 2019-04-30 MED ORDER — HYDROXYZINE HCL 10 MG PO TABS: 10.0000 mg | ORAL_TABLET | Freq: Three times a day (TID) | ORAL | 2 refills | Status: DC | PRN

## 2019-04-30 NOTE — Assessment & Plan Note (Signed)
Chronic, ongoing.  Continue current medication regimen, Vistaril as needed and Sertraline.  Minimally uses Vistaril at baseline, currently daily one dose use due to Covid pandemic and inability to visit daughter in [place].  Refills sent.

## 2019-04-30 NOTE — Progress Notes (Signed)
BP 117/69 Comment: pt reported- virtual visit  Pulse 66 Comment: pt reported- virtual visit  Temp 97.7 F (36.5 C) (Oral)   Wt 145 lb 12.8 oz (66.1 kg) Comment: pt reported  BMI 28.47 kg/m    Subjective:    Patient ID: Shelly Bridges, female    DOB: 01/09/1946, 73 y.o.   MRN: [id number]  HPI: Shelly Bridges is a 73 y.o. female  Chief Complaint  Patient presents with  . Hyperlipidemia  . Depression  . Hypertension   . This visit was completed via WebEx due to the restrictions of the COVID-19 pandemic. All issues as above were discussed and addressed. Physical exam was done as above through visual confirmation on WebEx. If it was felt that the patient should be evaluated in the office, they were directed there. The patient verbally consented to this visit. . Location of the patient: home . Location of the provider: home . Those involved with this call:  . Provider: Marnee Guarneri, DNP . CMA: Yvonna Alanis, CMA . Front Desk/Registration: Jill Side  . Time spent on call: 15 minutes with patient face to face via video conference. More than 50% of this time was spent in counseling and coordination of care. 10 minutes total spent in review of patient's record and preparation of their chart. I verified patient identity using two factors (patient name and date of birth). Patient consents verbally to being seen via telemedicine visit today.   HYPERTENSION / HYPERLIPIDEMIA No current medications for HTN or HLD.  Was at one time on medication, but was able to discontinue with focus on diet. Satisfied with current treatment? yes Duration of hypertension: chronic BP monitoring frequency: rarely BP range: 110-120/70's Past BP meds: HCTZ Duration of hyperlipidemia: chronic Past cholesterol medications: Lipitor Aspirin: no Recent stressors: no Recurrent headaches: no Visual changes: no Palpitations: no Dyspnea: no Chest pain: no Lower extremity edema: no  Dizzy/lightheaded: no   DEPRESSION Current medications include Sertraline 100 MG daily and Hydroxyzine 10 MG as needed.  Currently taking Hydroxyzine once a day over the past couple weeks due to anxiety over not being able to see her daughter and new grand baby that are in [place].  Reports at baseline she rarely takes Hydroxyzine.   Mood status: controlled Satisfied with current treatment?: yes Symptom severity: mild  Duration of current treatment : chronic Side effects: no Medication compliance: good compliance Psychotherapy/counseling: none Previous psychiatric medications: Sertraline Depressed mood: no Anxious mood: occasional Anhedonia: no Significant weight loss or gain: no Insomnia: none Fatigue: no Feelings of worthlessness or guilt: no Impaired concentration/indecisiveness: no Suicidal ideations: no Hopelessness: no Crying spells: no Depression screen [facility] 2/9 04/30/2019 10/25/2018 04/11/2018 10/11/2017 10/11/2017  Decreased Interest 1 1 0 0 0  Down, Depressed, Hopeless 1 1 0 0 0  PHQ - 2 Score 2 2 0 0 0  Altered sleeping 0 1 1 0 0  Tired, decreased energy 0 1 1 0 0  Change in appetite 0 0 1 0 0  Feeling bad or failure about yourself  0 1 0 0 0  Trouble concentrating 0 0 0 0 0  Moving slowly or fidgety/restless 0 0 0 0 0  Suicidal thoughts 0 0 0 0 0  PHQ-9 Score 2 5 3  0 0  Difficult doing work/chores Not difficult at all Somewhat difficult - - -   GAD 7 : Generalized Anxiety Score 04/30/2019 10/26/2015  Nervous, Anxious, on Edge 2 3  Control/stop worrying 1 3  Worry  too much - different things 1 3  Trouble relaxing 0 1  Restless 0 1  Easily annoyed or irritable 0 1  Afraid - awful might happen 0 1  Total GAD 7 Score 4 13  Anxiety Difficulty Not difficult at all Somewhat difficult   COPD Reports good control with Symbicort daily and Albuterol as needed.  Last Prednisone use was in December 2019. COPD status: stable Satisfied with current treatment?: yes Oxygen  use: no Dyspnea frequency: none Cough frequency: none Rescue inhaler frequency: over past month has used a couple Limitation of activity: no Productive cough: none Last Spirometry: 2018 Pneumovax: Up to Date Influenza: Up to Date  Relevant past medical, surgical, family and social history reviewed and updated as indicated. Interim medical history since our last visit reviewed. Allergies and medications reviewed and updated.  Review of Systems  Constitutional: Negative for activity change, appetite change, diaphoresis, fatigue and fever.  Respiratory: Negative for cough, chest tightness and shortness of breath.   Cardiovascular: Negative for chest pain, palpitations and leg swelling.  Gastrointestinal: Negative for abdominal distention, abdominal pain, constipation, diarrhea, nausea and vomiting.  Neurological: Negative for dizziness, syncope, weakness, light-headedness, numbness and headaches.  Psychiatric/Behavioral: Negative.     Per HPI unless specifically indicated above     Objective:    BP 117/69 Comment: pt reported- virtual visit  Pulse 66 Comment: pt reported- virtual visit  Temp 97.7 F (36.5 C) (Oral)   Wt 145 lb 12.8 oz (66.1 kg) Comment: pt reported  BMI 28.47 kg/m   Wt Readings from Last 3 Encounters:  04/30/19 145 lb 12.8 oz (66.1 kg)  10/25/18 144 lb (65.3 kg)  10/25/18 144 lb (65.3 kg)    Physical Exam Vitals signs and nursing note reviewed.  Constitutional:      General: She is awake. She is not in acute distress.    Appearance: She is well-developed. She is not ill-appearing.  HENT:     Head: Normocephalic.     Right Ear: Hearing normal.     Left Ear: Hearing normal.  Eyes:     General: Lids are normal.        Right eye: No discharge.        Left eye: No discharge.     Conjunctiva/sclera: Conjunctivae normal.  Neck:     Musculoskeletal: Normal range of motion.  Cardiovascular:     Comments: Unable to auscultate due to virtual exam only  Pulmonary:     Effort: Pulmonary effort is normal. No accessory muscle usage or respiratory distress.     Comments: Unable to auscultate due to virtual exam only  Neurological:     Mental Status: She is alert and oriented to person, place, and time.  Psychiatric:        Attention and Perception: Attention normal.        Mood and Affect: Mood normal.        Behavior: Behavior normal. Behavior is cooperative.        Thought Content: Thought content normal.        Judgment: Judgment normal.     Results for orders placed or performed in visit on 10/25/18  CBC with Differential/Platelet  Result Value Ref Range   WBC 3.6 3.4 - 10.8 x10E3/uL   RBC 4.03 3.77 - 5.28 x10E6/uL   Hemoglobin 12.8 11.1 - 15.9 g/dL   Hematocrit 38.3 34.0 - 46.6 %   MCV 95 79 - 97 fL   MCH 31.8 26.6 - 33.0 pg  MCHC 33.4 31.5 - 35.7 g/dL   RDW 11.6 (L) 12.3 - 15.4 %   Platelets 174 150 - 450 x10E3/uL   Neutrophils 53 Not Estab. %   Lymphs 33 Not Estab. %   Monocytes 9 Not Estab. %   Eos 4 Not Estab. %   Basos 1 Not Estab. %   Neutrophils Absolute 1.9 1.4 - 7.0 x10E3/uL   Lymphocytes Absolute 1.2 0.7 - 3.1 x10E3/uL   Monocytes Absolute 0.3 0.1 - 0.9 x10E3/uL   EOS (ABSOLUTE) 0.1 0.0 - 0.4 x10E3/uL   Basophils Absolute 0.1 0.0 - 0.2 x10E3/uL   Immature Granulocytes 0 Not Estab. %   Immature Grans (Abs) 0.0 0.0 - 0.1 x10E3/uL  TSH  Result Value Ref Range   TSH 3.170 0.450 - 4.500 uIU/mL  Comprehensive metabolic panel  Result Value Ref Range   Glucose 85 65 - 99 mg/dL   BUN 14 8 - 27 mg/dL   Creatinine, Ser 0.90 0.57 - 1.00 mg/dL   GFR calc non Af Amer 64 >59 mL/min/1.73   GFR calc Af Amer 74 >59 mL/min/1.73   BUN/Creatinine Ratio 16 12 - 28   Sodium 139 134 - 144 mmol/L   Potassium 3.9 3.5 - 5.2 mmol/L   Chloride 99 96 - 106 mmol/L   CO2 25 20 - 29 mmol/L   Calcium 9.3 8.7 - 10.3 mg/dL   Total Protein 6.6 6.0 - 8.5 g/dL   Albumin 4.0 3.5 - 4.8 g/dL   Globulin, Total 2.6 1.5 - 4.5 g/dL    Albumin/Globulin Ratio 1.5 1.2 - 2.2   Bilirubin Total 0.6 0.0 - 1.2 mg/dL   Alkaline Phosphatase 85 39 - 117 IU/L   AST 15 0 - 40 IU/L   ALT 7 0 - 32 IU/L  VITAMIN D 25 Hydroxy (Vit-D Deficiency, Fractures)  Result Value Ref Range   Vit D, 25-Hydroxy 20.3 (L) 30.0 - 100.0 ng/mL  Lipid Profile  Result Value Ref Range   Cholesterol, Total 209 (H) 100 - 199 mg/dL   Triglycerides 61 0 - 149 mg/dL   HDL 62 >39 mg/dL   VLDL Cholesterol Cal 12 5 - 40 mg/dL   LDL Calculated 135 (H) 0 - 99 mg/dL   Chol/HDL Ratio 3.4 0.0 - 4.4 ratio      Assessment & Plan:   Problem List Items Addressed This Visit      Cardiovascular and Mediastinum   Essential hypertension, benign    Chronic, stable.  BP below goal without medications.  Continue current diet and exercise regimen.  Labs next visit.        Respiratory   COPD (chronic obstructive pulmonary disease) (HCC) - Primary    Chronic, ongoing.  No recent exacerbations.  Continue current medication regimen and adjust as needed.  Plan for spirometry at annual physical.          Other   Anxiety    Chronic, ongoing.  Continue current medication regimen, Vistaril as needed and Sertraline.  Minimally uses Vistaril at baseline, currently daily one dose use due to Covid pandemic and inability to visit daughter in [place].  Refills sent.      Relevant Medications   hydrOXYzine (ATARAX/VISTARIL) 10 MG tablet   Major depression    Chronic, stable.  Continue current medication regimen and adjust as needed, Sertraline 100 MG daily.  PHQ9 = 2 today.        Relevant Medications   hydrOXYzine (ATARAX/VISTARIL) 10 MG tablet   Hyperlipidemia  Chronic, ongoing without medication.  LDL and TCHOL elevated on recent labs, nonfasting.  She wishes to focus on diet and exercise.  Will recheck fasting lipid panel at annual physical and add medication as needed.         I discussed the assessment and treatment plan with the patient. The patient was provided  an opportunity to ask questions and all were answered. The patient agreed with the plan and demonstrated an understanding of the instructions.   The patient was advised to call back or seek an in-person evaluation if the symptoms worsen or if the condition fails to improve as anticipated.   I provided 15 minutes of time during this encounter.  Follow up plan: Return in about 6 months (around 10/31/2019) for Annual Physical (may have to move up sooner if Covid  orders in place).

## 2019-04-30 NOTE — Assessment & Plan Note (Addendum)
Chronic, stable.  Continue current medication regimen and adjust as needed, Sertraline 100 MG daily.  PHQ9 = 2 today.

## 2019-04-30 NOTE — Assessment & Plan Note (Addendum)
Chronic, stable.  BP below goal without medications.  Continue current diet and exercise regimen.  Labs next visit.

## 2019-04-30 NOTE — Assessment & Plan Note (Signed)
Chronic, ongoing.  No recent exacerbations.  Continue current medication regimen and adjust as needed.  Plan for spirometry at annual physical.

## 2019-04-30 NOTE — Patient Instructions (Signed)
COPD and Physical Activity  Chronic obstructive pulmonary disease (COPD) is a long-term (chronic) condition that affects the lungs. COPD is a general term that can be used to describe many different lung problems that cause lung swelling (inflammation) and limit airflow, including chronic bronchitis and emphysema.  The main symptom of COPD is shortness of breath, which makes it harder to do even simple tasks. This can also make it harder to exercise and be active. Talk with your health care provider about treatments to help you breathe better and actions you can take to prevent breathing problems during physical activity.  What are the benefits of exercising with COPD?  Exercising regularly is an important part of a healthy lifestyle. You can still exercise and do physical activities even though you have COPD. Exercise and physical activity improve your shortness of breath by increasing blood flow (circulation). This causes your heart to pump more oxygen through your body. Moderate exercise can improve your:  · Oxygen use.  · Energy level.  · Shortness of breath.  · Strength in your breathing muscles.  · Heart health.  · Sleep.  · Self-esteem and feelings of self-worth.  · Depression, stress, and anxiety levels.  Exercise can benefit everyone with COPD. The severity of your disease may affect how hard you can exercise, especially at first, but everyone can benefit. Talk with your health care provider about how much exercise is safe for you, and which activities and exercises are safe for you.  What actions can I take to prevent breathing problems during physical activity?  · Sign up for a pulmonary rehabilitation program. This type of program may include:  ? Education about lung diseases.  ? Exercise classes that teach you how to exercise and be more active while improving your breathing. This usually involves:  § Exercise using your lower extremities, such as a stationary bicycle.  § About 30 minutes of exercise, 2  to 5 times per week, for 6 to 12 weeks  § Strength training, such as push ups or leg lifts.  ? Nutrition education.  ? Group classes in which you can talk with others who also have COPD and learn ways to manage stress.  · If you use an oxygen tank, you should use it while you exercise. Work with your health care provider to adjust your oxygen for your physical activity. Your resting flow rate is different from your flow rate during physical activity.  · While you are exercising:  ? Take slow breaths.  ? Pace yourself and do not try to go too fast.  ? Purse your lips while breathing out. Pursing your lips is similar to a kissing or whistling position.  ? If doing exercise that uses a quick burst of effort, such as weight lifting:  § Breathe in before starting the exercise.  § Breathe out during the hardest part of the exercise (such as raising the weights).  Where to find support  You can find support for exercising with COPD from:  · Your health care provider.  · A pulmonary rehabilitation program.  · Your local health department or community health programs.  · Support groups, online or in-person. Your health care provider may be able to recommend support groups.  Where to find more information  You can find more information about exercising with COPD from:  · American Lung Association: lung.org.  · COPD Foundation: copdfoundation.org.  Contact a health care provider if:  · Your symptoms get worse.  · You   have chest pain.  · You have nausea.  · You have a fever.  · You have trouble talking or catching your breath.  · You want to start a new exercise program or a new activity.  Summary  · COPD is a general term that can be used to describe many different lung problems that cause lung swelling (inflammation) and limit airflow. This includes chronic bronchitis and emphysema.  · Exercise and physical activity improve your shortness of breath by increasing blood flow (circulation). This causes your heart to provide more  oxygen to your body.  · Contact your health care provider before starting any exercise program or new activity. Ask your health care provider what exercises and activities are safe for you.  This information is not intended to replace advice given to you by your health care provider. Make sure you discuss any questions you have with your health care provider.  Document Released: 12/14/2017 Document Revised: 12/14/2017 Document Reviewed: 12/14/2017  Elsevier Interactive Patient Education © 2019 [employer]

## 2019-04-30 NOTE — Assessment & Plan Note (Signed)
Chronic, ongoing without medication.  LDL and TCHOL elevated on recent labs, nonfasting.  She wishes to focus on diet and exercise.  Will recheck fasting lipid panel at annual physical and add medication as needed.

## 2019-05-06 ENCOUNTER — Telehealth: Payer: Self-pay | Admitting: Nurse Practitioner

## 2019-05-06 NOTE — Telephone Encounter (Signed)
Called pt to schedule physical in 6 months no answer left voicemail. Sent my chart message

## 2019-05-06 NOTE — Telephone Encounter (Signed)
Patient called back to schedule. Call back # [phone]

## 2019-07-15 ENCOUNTER — Other Ambulatory Visit: Payer: Self-pay | Admitting: Nurse Practitioner

## 2019-07-15 MED ORDER — SERTRALINE HCL 100 MG PO TABS: 100.0000 mg | ORAL_TABLET | Freq: Every day | ORAL | 3 refills | Status: DC

## 2019-07-15 NOTE — Progress Notes (Signed)
Zoloft refill sent

## 2019-09-11 ENCOUNTER — Other Ambulatory Visit: Payer: Self-pay

## 2019-09-11 MED ORDER — BETAMETHASONE DIPROPIONATE AUG 0.05 % EX CREA: TOPICAL_CREAM | Freq: Two times a day (BID) | CUTANEOUS | 0 refills | Status: DC

## 2019-10-23 LAB — HM MAMMOGRAPHY

## 2019-10-28 ENCOUNTER — Ambulatory Visit (INDEPENDENT_AMBULATORY_CARE_PROVIDER_SITE_OTHER): Payer: Medicare Other

## 2019-10-28 DIAGNOSIS — Z0000 Encounter for general adult medical examination without abnormal findings: Secondary | ICD-10-CM | POA: Diagnosis not present

## 2019-10-28 NOTE — Progress Notes (Signed)
Subjective:   Shelly Bridges is a 73 y.o. female who presents for Medicare Annual (Subsequent) preventive examination.  This visit is being conducted via phone call  - after an attempt to do on video chat - due to the COVID-19 pandemic. This patient has given me verbal consent via phone to conduct this visit, patient states they are participating from their home address. Some vital signs may be absent or patient reported.   Patient identification: identified by name, DOB, and current address.    Review of Systems:   Cardiac Risk Factors include: advanced age (>48men, >66 women)     Objective:     Vitals: There were no vitals taken for this visit.  There is no height or weight on file to calculate BMI.  Advanced Directives 10/28/2019 10/25/2018 10/11/2017 09/30/2016  Does Patient Have a Medical Advance Directive? No Yes Yes No  Type of Advance Directive - [place];Living will - -  Does patient want to make changes to medical advance directive? - Yes (MAU/Ambulatory/Procedural Areas - Information given) Yes (MAU/Ambulatory/Procedural Areas - Information given) -  Copy of [place] in Chart? - No - copy requested - -    Tobacco Social History   Tobacco Use  Smoking Status Former Smoker  . Packs/day: 1.00  . Years: 20.00  . Pack years: 20.00  . Types: Cigarettes  . Quit date: 06/17/1985  . Years since quitting: 34.3  Smokeless Tobacco Never Used     Counseling given: Not Answered   Clinical Intake:  Pre-visit preparation completed: Yes  Pain : No/denies pain     Diabetes: No     Interpreter Needed?: No  Information entered by :: Amaury Kuzel,LPN  Past Medical History:  Diagnosis Date  . Anxiety   . Asthma   . Lipoma   . Neutropenia ([place])   . Osteoporosis   . Vitamin D deficiency disease    History reviewed. No pertinent surgical history. Family History  Problem Relation Age of Onset  . Dementia Mother    . Hypertension Mother   . Osteoporosis Mother   . Stroke Mother        mini stroke  . Cancer Father        brain tumor and lung  . Hypertension Father   . Emphysema Father   . COPD Neg Hx   . Heart disease Neg Hx    Social History   Socioeconomic History  . Marital status: Divorced    Spouse name: Not on file  . Number of children: 3  . Years of education: Not on file  . Highest education level: Not on file  Occupational History  . Not on file  Social Needs  . Financial resource strain: Not hard at all  . Food insecurity    Worry: Never true    Inability: Never true  . Transportation needs    Medical: No    Non-medical: No  Tobacco Use  . Smoking status: Former Smoker    Packs/day: 1.00    Years: 20.00    Pack years: 20.00    Types: Cigarettes    Quit date: 06/17/1985    Years since quitting: 34.3  . Smokeless tobacco: Never Used  Substance and Sexual Activity  . Alcohol use: No  . Drug use: No  . Sexual activity: Never  Lifestyle  . Physical activity    Days per week: 7 days    Minutes per session: 30 min  .  Stress: Only a little  Relationships  . Social connections    Talks on phone: More than three times a week    Gets together: More than three times a week    Attends religious service: Never    Active member of club or organization: No    Attends meetings of clubs or organizations: Never    Relationship status: Divorced  Other Topics Concern  . Not on file  Social History Narrative  . Not on file    Outpatient Encounter Medications as of 10/28/2019  Medication Sig  . albuterol (PROVENTIL HFA;VENTOLIN HFA) 108 (90 Base) MCG/ACT inhaler INHALE 2 PUFFS INTO THE LUNGS EVERY 4 HOURS AS NEEDED FOR WHEEZING OR SHORTNESS OF BREATH  . augmented betamethasone dipropionate (DIPROLENE AF) 0.05 % cream Apply topically 2 (two) times daily.  . hydrOXYzine (ATARAX/VISTARIL) 10 MG tablet Take 1 tablet (10 mg total) by mouth 3 (three) times daily as needed.  .  sertraline (ZOLOFT) 100 MG tablet Take 1 tablet (100 mg total) by mouth daily.  . SYMBICORT 160-4.5 MCG/ACT inhaler INHALE 2 PUFFS INTO THE LUNGS TWICE DAILY   No facility-administered encounter medications on file as of 10/28/2019.     Activities of Daily Living In your present state of health, do you have any difficulty performing the following activities: 10/28/2019  Hearing? N  Comment no hearing aids  Vision? N  Comment eyeglasses, Dr.[facility] annually  Difficulty concentrating or making decisions? N  Walking or climbing stairs? N  Dressing or bathing? N  Doing errands, shopping? N  Preparing Food and eating ? N  Using the Toilet? N  In the past six months, have you accidently leaked urine? N  Do you have problems with loss of bowel control? N  Managing your Medications? N  Managing your Finances? N  Housekeeping or managing your Housekeeping? N  Some recent data might be hidden    Patient Care Team: Venita Lick, NP as PCP - General (Nurse Practitioner)    Assessment:   This is a routine wellness examination for Shelly Bridges.  Exercise Activities and Dietary recommendations Current Exercise Habits: Home exercise routine, Type of exercise: strength training/weights, Time (Minutes): 45, Frequency (Times/Week): 7, Weekly Exercise (Minutes/Week): 315  Goals    . Increase water intake     Recommend drinking at least 4-5 glasses of water a day       Fall Risk: Fall Risk  10/28/2019 10/25/2018 04/11/2018 10/11/2017 10/11/2017  Falls in the past year? 0 0 No No No  Number falls in past yr: 0 0 - - -  Injury with Fall? 0 0 - - -    FALL RISK PREVENTION PERTAINING TO THE HOME:  Any stairs in or around the home? No  If so, are there any without handrails? No   Home free of loose throw rugs in walkways, pet beds, electrical cords, etc? Yes  Adequate lighting in your home to reduce risk of falls? Yes   ASSISTIVE DEVICES UTILIZED TO PREVENT FALLS:  Life alert? No  Use of  a cane, walker or w/c? No  Grab bars in the bathroom? No  Shower chair or bench in shower? No  Elevated toilet seat or a handicapped toilet? No   DME ORDERS:  DME order needed?  No   TIMED UP AND GO:  Unable to perform   Depression Screen PHQ 2/9 Scores 10/28/2019 04/30/2019 10/25/2018 04/11/2018  PHQ - 2 Score 0 2 2 0  PHQ- 9 Score - 2  5 3     Cognitive Function     6CIT Screen 10/25/2018 10/11/2017  What Year? 0 points 0 points  What month? 0 points 0 points  What time? 0 points 0 points  Count back from 20 0 points 0 points  Months in reverse 0 points 0 points  Repeat phrase 0 points 2 points  Total Score 0 2    Immunization History  Administered Date(s) Administered  . Fluad Quad(high Dose 65+) 09/19/2019  . Influenza, High Dose Seasonal PF 10/25/2016, 10/11/2017, 10/05/2018  . Influenza,inj,Quad PF,6+ Mos 10/26/2015  . Pneumococcal Conjugate-13 07/03/2014  . Pneumococcal Polysaccharide-23 10/26/2015  . Td 12/05/2008  . Tdap 06/04/2018  . Zoster Recombinat (Shingrix) 01/28/2019, 07/02/2019    Qualifies for Shingles Vaccine? Yes  shingrix completed   Tdap: up to date   Flu Vaccine: up to date   Pneumococcal Vaccine: up to date   Screening Tests Health Maintenance  Topic Date Due  . COLONOSCOPY  02/19/1996  . MAMMOGRAM  10/22/2021  . TETANUS/TDAP  06/04/2028  . INFLUENZA VACCINE  Completed  . DEXA SCAN  Completed  . Hepatitis C Screening  Completed  . PNA vac Low Risk Adult  Completed    Cancer Screenings:  Colorectal Screening: cologuard ordered.  Mammogram: Com[facility] 10/23/2019 [facility]   Bone Density: 2015  Lung Cancer Screening: (Low Dose CT Chest recommended if Age 57-80 years, 30 pack-year currently smoking OR have quit w/in 15years.) does not qualify.    Additional Screening:  Hepatitis C Screening: does qualify; Completed 2018  Vision Screening: Recommended annual ophthalmology exams for early detection of glaucoma and other  disorders of the eye. Is the patient up to date with their annual eye exam?  Yes  Who is the provider or what is the name of the office in which the pt attends annual eye exams? [facility]  Dental Screening: Recommended annual dental exams for proper oral hygiene  Community Resource Referral:  CRR required this visit?  No       Plan:  I have personally reviewed and addressed the Medicare Annual Wellness questionnaire and have noted the following in the patient's chart:  A. Medical and social history B. Use of alcohol, tobacco or illicit drugs  C. Current medications and supplements D. Functional ability and status E.  Nutritional status F.  Physical activity G. Advance directives H. List of other physicians I.  Hospitalizations, surgeries, and ER visits in previous 12 months J.  [place] such as hearing and vision if needed, cognitive and depression L. Referrals and appointments   In addition, I have reviewed and discussed with patient certain preventive protocols, quality metrics, and best practice recommendations. A written personalized care plan for preventive services as well as general preventive health recommendations were provided to patient.  Signed,    Bevelyn Ngo, LPN  [id number] Nurse Health Advisor   Nurse Notes: none

## 2019-10-28 NOTE — Patient Instructions (Signed)
Shelly Bridges , Thank you for taking time to come for your Medicare Wellness Visit. I appreciate your ongoing commitment to your health goals. Please review the following plan we discussed and let me know if I can assist you in the future.   Screening recommendations/referrals: Colonoscopy: cologuard ordered  Mammogram: up to date Bone Density: up to date Recommended yearly ophthalmology/optometry visit for glaucoma screening and checkup Recommended yearly dental visit for hygiene and checkup  Vaccinations: Influenza vaccine: up to date Pneumococcal vaccine: up to date Tdap vaccine: up to date Shingles vaccine: up to date    Advanced directives: Please bring a copy of your health care power of attorney and living will to the office once this is completed.   Conditions/risks identified: continue the great work with exercising!  Next appointment: follow up in one year for your annual wellness visit.   Preventive Care 6 Years and Older, Female Preventive care refers to lifestyle choices and visits with your health care provider that can promote health and wellness. What does preventive care include?  A yearly physical exam. This is also called an annual well check.  Dental exams once or twice a year.  Routine eye exams. Ask your health care provider how often you should have your eyes checked.  Personal lifestyle choices, including:  Daily care of your teeth and gums.  Regular physical activity.  Eating a healthy diet.  Avoiding tobacco and drug use.  Limiting alcohol use.  Practicing safe sex.  Taking low-dose aspirin every day.  Taking vitamin and mineral supplements as recommended by your health care provider. What happens during an annual well check? The services and screenings done by your health care provider during your annual well check will depend on your age, overall health, lifestyle risk factors, and family history of disease. Counseling  Your health care  provider may ask you questions about your:  Alcohol use.  Tobacco use.  Drug use.  Emotional well-being.  Home and relationship well-being.  Sexual activity.  Eating habits.  History of falls.  Memory and ability to understand (cognition).  Work and work Statistician.  Reproductive health. Screening  You may have the following tests or measurements:  Height, weight, and BMI.  Blood pressure.  Lipid and cholesterol levels. These may be checked every 5 years, or more frequently if you are over 4 years old.  Skin check.  Lung cancer screening. You may have this screening every year starting at age 21 if you have a 30-pack-year history of smoking and currently smoke or have quit within the past 15 years.  Fecal occult blood test (FOBT) of the stool. You may have this test every year starting at age 70.  Flexible sigmoidoscopy or colonoscopy. You may have a sigmoidoscopy every 5 years or a colonoscopy every 10 years starting at age 35.  Hepatitis C blood test.  Hepatitis B blood test.  Sexually transmitted disease (STD) testing.  Diabetes screening. This is done by checking your blood sugar (glucose) after you have not eaten for a while (fasting). You may have this done every 1-3 years.  Bone density scan. This is done to screen for osteoporosis. You may have this done starting at age 93.  Mammogram. This may be done every 1-2 years. Talk to your health care provider about how often you should have regular mammograms. Talk with your health care provider about your test results, treatment options, and if necessary, the need for more tests. Vaccines  Your health care provider  may recommend certain vaccines, such as:  Influenza vaccine. This is recommended every year.  Tetanus, diphtheria, and acellular pertussis (Tdap, Td) vaccine. You may need a Td booster every 10 years.  Zoster vaccine. You may need this after age 41.  Pneumococcal 13-valent conjugate (PCV13)  vaccine. One dose is recommended after age 30.  Pneumococcal polysaccharide (PPSV23) vaccine. One dose is recommended after age 23. Talk to your health care provider about which screenings and vaccines you need and how often you need them. This information is not intended to replace advice given to you by your health care provider. Make sure you discuss any questions you have with your health care provider. Document Released: 12/18/2015 Document Revised: 08/10/2016 Document Reviewed: 09/22/2015 Elsevier Interactive Patient Education  2017 [place] Prevention in the Home Falls can cause injuries. They can happen to people of all ages. There are many things you can do to make your home safe and to help prevent falls. What can I do on the outside of my home?  Regularly fix the edges of walkways and driveways and fix any cracks.  Remove anything that might make you trip as you walk through a door, such as a raised step or threshold.  Trim any bushes or trees on the path to your home.  Use bright outdoor lighting.  Clear any walking paths of anything that might make someone trip, such as rocks or tools.  Regularly check to see if handrails are loose or broken. Make sure that both sides of any steps have handrails.  Any raised decks and porches should have guardrails on the edges.  Have any leaves, snow, or ice cleared regularly.  Use sand or salt on walking paths during winter.  Clean up any spills in your garage right away. This includes oil or grease spills. What can I do in the bathroom?  Use night lights.  Install grab bars by the toilet and in the tub and shower. Do not use towel bars as grab bars.  Use non-skid mats or decals in the tub or shower.  If you need to sit down in the shower, use a plastic, non-slip stool.  Keep the floor dry. Clean up any water that spills on the floor as soon as it happens.  Remove soap buildup in the tub or shower regularly.   Attach bath mats securely with double-sided non-slip rug tape.  Do not have throw rugs and other things on the floor that can make you trip. What can I do in the bedroom?  Use night lights.  Make sure that you have a light by your bed that is easy to reach.  Do not use any sheets or blankets that are too big for your bed. They should not hang down onto the floor.  Have a firm chair that has side arms. You can use this for support while you get dressed.  Do not have throw rugs and other things on the floor that can make you trip. What can I do in the kitchen?  Clean up any spills right away.  Avoid walking on wet floors.  Keep items that you use a lot in easy-to-reach places.  If you need to reach something above you, use a strong step stool that has a grab bar.  Keep electrical cords out of the way.  Do not use floor polish or wax that makes floors slippery. If you must use wax, use non-skid floor wax.  Do not have throw rugs  and other things on the floor that can make you trip. What can I do with my stairs?  Do not leave any items on the stairs.  Make sure that there are handrails on both sides of the stairs and use them. Fix handrails that are broken or loose. Make sure that handrails are as long as the stairways.  Check any carpeting to make sure that it is firmly attached to the stairs. Fix any carpet that is loose or worn.  Avoid having throw rugs at the top or bottom of the stairs. If you do have throw rugs, attach them to the floor with carpet tape.  Make sure that you have a light switch at the top of the stairs and the bottom of the stairs. If you do not have them, ask someone to add them for you. What else can I do to help prevent falls?  Wear shoes that:  Do not have high heels.  Have rubber bottoms.  Are comfortable and fit you well.  Are closed at the toe. Do not wear sandals.  If you use a stepladder:  Make sure that it is fully opened. Do not climb  a closed stepladder.  Make sure that both sides of the stepladder are locked into place.  Ask someone to hold it for you, if possible.  Clearly mark and make sure that you can see:  Any grab bars or handrails.  First and last steps.  Where the edge of each step is.  Use tools that help you move around (mobility aids) if they are needed. These include:  Canes.  Walkers.  Scooters.  Crutches.  Turn on the lights when you go into a dark area. Replace any light bulbs as soon as they burn out.  Set up your furniture so you have a clear path. Avoid moving your furniture around.  If any of your floors are uneven, fix them.  If there are any pets around you, be aware of where they are.  Review your medicines with your doctor. Some medicines can make you feel dizzy. This can increase your chance of falling. Ask your doctor what other things that you can do to help prevent falls. This information is not intended to replace advice given to you by your health care provider. Make sure you discuss any questions you have with your health care provider. Document Released: 09/17/2009 Document Revised: 04/28/2016 Document Reviewed: 12/26/2014 Elsevier Interactive Patient Education  2017 [employer].

## 2019-11-01 ENCOUNTER — Encounter: Payer: Medicare Other | Admitting: Nurse Practitioner

## 2019-11-11 ENCOUNTER — Other Ambulatory Visit: Payer: Self-pay | Admitting: Nurse Practitioner

## 2019-11-11 ENCOUNTER — Telehealth: Payer: Self-pay | Admitting: Nurse Practitioner

## 2019-11-11 MED ORDER — BUDESONIDE-FORMOTEROL FUMARATE 160-4.5 MCG/ACT IN AERO: 2.0000 | INHALATION_SPRAY | Freq: Two times a day (BID) | RESPIRATORY_TRACT | 3 refills | Status: DC

## 2019-11-11 NOTE — Telephone Encounter (Signed)
Called pt to let her know that refills have been sent, no answer, left vm. Pt scheduled to have cpe with [facility] tomorrow

## 2019-11-11 NOTE — Telephone Encounter (Signed)
Tell her I do apologize, are we able to fit her in before she leaves?  If not please ensure scheduled upon return.  I have sent refills.

## 2019-11-11 NOTE — Telephone Encounter (Signed)
Called pt to r/s cpe. Pt states that she is leaving to go [place] for 3 [place] and is needing symbicort 160-4.5 mcg filled before leaving. Please advise.

## 2019-11-12 ENCOUNTER — Other Ambulatory Visit: Payer: Self-pay

## 2019-11-12 ENCOUNTER — Ambulatory Visit (INDEPENDENT_AMBULATORY_CARE_PROVIDER_SITE_OTHER): Payer: Medicare Other | Admitting: Unknown Physician Specialty

## 2019-11-12 ENCOUNTER — Encounter: Payer: Medicare Other | Admitting: Nurse Practitioner

## 2019-11-12 ENCOUNTER — Encounter: Payer: Self-pay | Admitting: Unknown Physician Specialty

## 2019-11-12 VITALS — BP 171/80 | HR 73 | Temp 98.5°F | Ht 59.5 in | Wt 146.2 lb

## 2019-11-12 DIAGNOSIS — J410 Simple chronic bronchitis: Secondary | ICD-10-CM | POA: Diagnosis not present

## 2019-11-12 DIAGNOSIS — I10 Essential (primary) hypertension: Secondary | ICD-10-CM

## 2019-11-12 DIAGNOSIS — Z0000 Encounter for general adult medical examination without abnormal findings: Secondary | ICD-10-CM | POA: Diagnosis not present

## 2019-11-12 DIAGNOSIS — M810 Age-related osteoporosis without current pathological fracture: Secondary | ICD-10-CM | POA: Diagnosis not present

## 2019-11-12 DIAGNOSIS — F419 Anxiety disorder, unspecified: Secondary | ICD-10-CM

## 2019-11-12 DIAGNOSIS — L309 Dermatitis, unspecified: Secondary | ICD-10-CM

## 2019-11-12 MED ORDER — MUPIROCIN 2 % EX OINT: 1.0000 "application " | TOPICAL_OINTMENT | Freq: Two times a day (BID) | CUTANEOUS | 0 refills | Status: DC

## 2019-11-12 MED ORDER — BETAMETHASONE DIPROPIONATE AUG 0.05 % EX CREA: TOPICAL_CREAM | Freq: Two times a day (BID) | CUTANEOUS | 0 refills | Status: DC

## 2019-11-12 MED ORDER — HYDROCHLOROTHIAZIDE 25 MG PO TABS: 25.0000 mg | ORAL_TABLET | Freq: Every day | ORAL | 3 refills | Status: DC

## 2019-11-12 NOTE — Assessment & Plan Note (Signed)
Stable on present medication

## 2019-11-12 NOTE — Progress Notes (Signed)
BP (!) 171/80 (BP Location: Left Arm, Cuff Size: Normal)   Pulse 73   Temp 98.5 F (36.9 C) (Oral)   Ht 4' 11.5" (1.511 m)   Wt 146 lb 3.2 oz (66.3 kg)   SpO2 98%   BMI 29.03 kg/m    Subjective:    Patient ID: Shelly Bridges, female    DOB: Jun 03, 1946, 73 y.o.   MRN: [id number]  HPI: Shelly Bridges is a 73 y.o. female  Chief Complaint  Patient presents with  . Annual Exam    pt had wellness 10/28/19   COPD Doing well with present treatment.  Taking inhalers mostly as directed but Symbicort only when she feels like it. Needing to use Albuterol less often.  Travelling to [place]. Wants prednisone and Tussionex in case she gets a flare in [place].   Anxiety Doing will with present medication.  Taking her anti-depressant daily.   Depression screen [facility] 2/9 11/12/2019 10/28/2019 04/30/2019 10/25/2018 04/11/2018  Decreased Interest 1 0 1 1 0  Down, Depressed, Hopeless 1 0 1 1 0  PHQ - 2 Score 2 0 2 2 0  Altered sleeping 0 - 0 1 1  Tired, decreased energy 1 - 0 1 1  Change in appetite 0 - 0 0 1  Feeling bad or failure about yourself  0 - 0 1 0  Trouble concentrating 0 - 0 0 0  Moving slowly or fidgety/restless 0 - 0 0 0  Suicidal thoughts 0 - 0 0 0  PHQ-9 Score 3 - 2 5 3   Difficult doing work/chores Not difficult at all - Not difficult at all Somewhat difficult -  Some recent data might be hidden   Hypertension Not taking medications.  Had been on HCTZ in the past Average home BPs not chekcing   No problems or lightheadedness No chest pain with exertion or shortness of breath No Edema  Soreness around nose and inside nostril. She has had this before and improved with Bactroban and Diprolene.    Relevant past medical, surgical, family and social history reviewed and updated as indicated. Interim medical history since our last visit reviewed. Allergies and medications reviewed and updated.  Review of Systems  Constitutional: Negative.   HENT: Negative.    Eyes: Negative.   Gastrointestinal: Negative.   Endocrine: Negative.   Genitourinary: Negative.   Musculoskeletal: Negative.   Allergic/Immunologic: Negative.   Neurological: Negative.     Per HPI unless specifically indicated above     Objective:    BP (!) 171/80 (BP Location: Left Arm, Cuff Size: Normal)   Pulse 73   Temp 98.5 F (36.9 C) (Oral)   Ht 4' 11.5" (1.511 m)   Wt 146 lb 3.2 oz (66.3 kg)   SpO2 98%   BMI 29.03 kg/m   Wt Readings from Last 3 Encounters:  11/12/19 146 lb 3.2 oz (66.3 kg)  04/30/19 145 lb 12.8 oz (66.1 kg)  10/25/18 144 lb (65.3 kg)    Physical Exam Constitutional:      Appearance: She is well-developed.  HENT:     Head: Normocephalic and atraumatic.  Eyes:     General: No scleral icterus.       Right eye: No discharge.        Left eye: No discharge.     Pupils: Pupils are equal, round, and reactive to light.  Neck:     Musculoskeletal: Normal range of motion and neck supple.     Thyroid: No thyromegaly.  Vascular: No carotid bruit.  Cardiovascular:     Rate and Rhythm: Normal rate and regular rhythm.     Heart sounds: Normal heart sounds. No murmur. No friction rub. No gallop.   Pulmonary:     Effort: Pulmonary effort is normal. No respiratory distress.     Breath sounds: Normal breath sounds. No wheezing or rales.  Chest:     Breasts:        Right: Normal.        Left: Normal.  Abdominal:     General: Bowel sounds are normal.     Palpations: Abdomen is soft.     Tenderness: There is no abdominal tenderness. There is no rebound.  Musculoskeletal: Normal range of motion.  Lymphadenopathy:     Cervical: No cervical adenopathy.  Skin:    General: Skin is warm and dry.     Findings: No rash.     Comments: Chafing around nose.  No tenderness or swelling  Neurological:     Mental Status: She is alert and oriented to person, place, and time.  Psychiatric:        Speech: Speech normal.        Behavior: Behavior normal.         Thought Content: Thought content normal.        Judgment: Judgment normal.     Results for orders placed or performed in visit on 10/28/19  HM MAMMOGRAPHY  Result Value Ref Range   HM Mammogram 0-4 Bi-Rad 0-4 Bi-Rad, Self Reported Normal      Assessment & Plan:   Problem List Items Addressed This Visit      Unprioritized   Anxiety    Stable on present medication      COPD (chronic obstructive pulmonary disease) ([place])    Stable, continue present medications. Discussed if she is ill I would want her to see a provider rather than self treat with Tussionex and prednisone       Essential hypertension, benign    Restart HCTZ.  Pt ed on potassium replacment      Relevant Medications   hydrochlorothiazide (HYDRODIURIL) 25 MG tablet   Other Relevant Orders   Comprehensive metabolic panel   TSH   Lipid Panel w/o Chol/HDL Ratio out   Osteoporosis    Hasn't had a bone density in a while and not on Bisphosomate.  Check bone density today      Relevant Orders   DG Bone Density    Other Visit Diagnoses    Routine general medical examination at a health care facility    -  Primary   Dermatitis       Around nose.  Rx Diprolene to use very sparingly and Bactroban      Flu shot 1 month ago at [employer] on Colog[facility] screening Immunizations up to date except RTC for injury for tetnus  Follow up plan: Return in about 3 months (around 02/10/2020) for BP check.

## 2019-11-12 NOTE — Patient Instructions (Signed)
Please do call to schedule your bone density; the number to schedule one at either [facility] or [facility] is [phone]   

## 2019-11-12 NOTE — Assessment & Plan Note (Addendum)
Hasn't had a bone density in a while and not on Bisphosomate.  Check bone density today

## 2019-11-12 NOTE — Assessment & Plan Note (Signed)
Stable, continue present medications. Discussed if she is ill I would want her to see a provider rather than self treat with Tussionex and prednisone

## 2019-11-12 NOTE — Assessment & Plan Note (Signed)
Restart HCTZ.  Pt ed on potassium replacment

## 2019-11-13 LAB — COMPREHENSIVE METABOLIC PANEL
ALT: 6 IU/L (ref 0–32)
AST: 13 IU/L (ref 0–40)
Albumin/Globulin Ratio: 1.6 (ref 1.2–2.2)
Albumin: 4.2 g/dL (ref 3.7–4.7)
Alkaline Phosphatase: 90 IU/L (ref 39–117)
BUN/Creatinine Ratio: 16 (ref 12–28)
BUN: 13 mg/dL (ref 8–27)
Bilirubin Total: 0.4 mg/dL (ref 0.0–1.2)
CO2: 26 mmol/L (ref 20–29)
Calcium: 9.1 mg/dL (ref 8.7–10.3)
Chloride: 101 mmol/L (ref 96–106)
Creatinine, Ser: 0.83 mg/dL (ref 0.57–1.00)
GFR calc Af Amer: 81 mL/min/{1.73_m2} (ref >59)
GFR calc non Af Amer: 70 mL/min/{1.73_m2} (ref >59)
Globulin, Total: 2.6 g/dL (ref 1.5–4.5)
Glucose: 80 mg/dL (ref 65–99)
Potassium: 4.3 mmol/L (ref 3.5–5.2)
Sodium: 140 mmol/L (ref 134–144)
Total Protein: 6.8 g/dL (ref 6.0–8.5)

## 2019-11-13 LAB — LIPID PANEL W/O CHOL/HDL RATIO
Cholesterol, Total: 193 mg/dL (ref 100–199)
HDL: 71 mg/dL (ref >39)
LDL Chol Calc (NIH): 112 mg/dL — ABNORMAL HIGH (ref 0–99)
Triglycerides: 51 mg/dL (ref 0–149)
VLDL Cholesterol Cal: 10 mg/dL (ref 5–40)

## 2019-11-13 LAB — TSH: TSH: 2.96 u[IU]/mL (ref 0.450–4.500)

## 2019-11-13 LAB — VITAMIN D 25 HYDROXY (VIT D DEFICIENCY, FRACTURES): Vit D, 25-Hydroxy: 21 ng/mL — ABNORMAL LOW (ref 30.0–100.0)

## 2020-03-12 ENCOUNTER — Encounter: Payer: Self-pay | Admitting: Nurse Practitioner

## 2020-03-25 ENCOUNTER — Telehealth: Payer: Medicare PPO | Admitting: Family

## 2020-03-25 DIAGNOSIS — L255 Unspecified contact dermatitis due to plants, except food: Secondary | ICD-10-CM

## 2020-03-25 MED ORDER — PREDNISONE 10 MG (21) PO TBPK: ORAL_TABLET | ORAL | 0 refills | Status: DC

## 2020-03-25 NOTE — Progress Notes (Signed)

## 2020-04-03 ENCOUNTER — Telehealth: Payer: Medicare PPO | Admitting: Physician Assistant

## 2020-04-03 DIAGNOSIS — R21 Rash and other nonspecific skin eruption: Secondary | ICD-10-CM

## 2020-04-03 NOTE — Progress Notes (Signed)
Based on what you shared with me, I feel your condition warrants further evaluation and I recommend that you be seen for a face to face office visit.  Given the rash is recurrent, I would recommend seeking evaluation in person to determine the best treatment plan. This can be done at your PCP's office or local urgent care.    NOTE: If you entered your credit card information for this eVisit, you will not be charged. You may see a "hold" on your card for the $35 but that hold will drop off and you will not have a charge processed.   If you are having a true medical emergency please call 911.      For an urgent face to face visit, [place] has five urgent care centers for your convenience:      NEW:  [facility] [place] [facility] [place] Get Driving Directions [id number] [place], [place] [address] . 10 am - 6pm Monday - Friday    [facility] [place] [facility]) Get Driving Directions [id number] [address] [place], [place] [address] . 10 am to 8 pm Monday-Friday . 12 pm to 8 pm [facility] at [facility] Get Driving Directions [id number] [place], [place], [place] [address] . 8 am to 8 pm Monday-Friday . 9 am to 6 pm Saturday . 11 am to 6 pm Sunday     [facility] at [facility] Get Driving Directions  [id number] [address], [place] [address] . 8 am to 8 pm Monday-Friday . 8 am to 4 pm [facility] at [place] Get Driving Directions [id number] [address] [place], [place] [address] . 12 pm to 6 pm Monday-Friday      Your e-visit answers were reviewed by a board certified advanced clinical practitioner to complete your personal care plan.  Thank you for using e-Visits.   Greater than 5 minutes, yet less than 10 minutes of time have been spent researching, coordinating,  and implementing care for this patient today.

## 2020-04-06 ENCOUNTER — Other Ambulatory Visit: Payer: Self-pay

## 2020-04-06 ENCOUNTER — Ambulatory Visit (INDEPENDENT_AMBULATORY_CARE_PROVIDER_SITE_OTHER): Payer: Medicare PPO | Admitting: Nurse Practitioner

## 2020-04-06 ENCOUNTER — Encounter: Payer: Self-pay | Admitting: Nurse Practitioner

## 2020-04-06 VITALS — BP 118/68 | HR 69 | Temp 98.1°F

## 2020-04-06 DIAGNOSIS — I10 Essential (primary) hypertension: Secondary | ICD-10-CM | POA: Diagnosis not present

## 2020-04-06 DIAGNOSIS — L237 Allergic contact dermatitis due to plants, except food: Secondary | ICD-10-CM | POA: Insufficient documentation

## 2020-04-06 MED ORDER — TRIAMCINOLONE ACETONIDE 0.1 % EX CREA: 1.0000 "application " | TOPICAL_CREAM | Freq: Two times a day (BID) | CUTANEOUS | 0 refills | Status: DC

## 2020-04-06 NOTE — Patient Instructions (Signed)
DASH Eating Plan DASH stands for "Dietary Approaches to Stop Hypertension." The DASH eating plan is a healthy eating plan that has been shown to reduce high blood pressure (hypertension). It may also reduce your risk for type 2 diabetes, heart disease, and stroke. The DASH eating plan may also help with weight loss. What are tips for following this plan?  General guidelines  Avoid eating more than 2,300 mg (milligrams) of salt (sodium) a day. If you have hypertension, you may need to reduce your sodium intake to 1,500 mg a day.  Limit alcohol intake to no more than 1 drink a day for nonpregnant women and 2 drinks a day for men. One drink equals 12 oz of beer, 5 oz of wine, or 1 oz of hard liquor.  Work with your health care provider to maintain a healthy body weight or to lose weight. Ask what an ideal weight is for you.  Get at least 30 minutes of exercise that causes your heart to beat faster (aerobic exercise) most days of the week. Activities may include walking, swimming, or biking.  Work with your health care provider or diet and nutrition specialist (dietitian) to adjust your eating plan to your individual calorie needs. Reading food labels   Check food labels for the amount of sodium per serving. Choose foods with less than 5 percent of the Daily Value of sodium. Generally, foods with less than 300 mg of sodium per serving fit into this eating plan.  To find whole grains, look for the word "whole" as the first word in the ingredient list. Shopping  Buy products labeled as "low-sodium" or "no salt added."  Buy fresh foods. Avoid canned foods and premade or frozen meals. Cooking  Avoid adding salt when cooking. Use salt-free seasonings or herbs instead of table salt or sea salt. Check with your health care provider or pharmacist before using salt substitutes.  Do not fry foods. Cook foods using healthy methods such as baking, boiling, grilling, and broiling instead.  Cook with  heart-healthy oils, such as olive, canola, soybean, or sunflower oil. Meal planning  Eat a balanced diet that includes: ? 5 or more servings of fruits and vegetables each day. At each meal, try to fill half of your plate with fruits and vegetables. ? Up to 6-8 servings of whole grains each day. ? Less than 6 oz of lean meat, poultry, or fish each day. A 3-oz serving of meat is about the same size as a deck of cards. One egg equals 1 oz. ? 2 servings of low-fat dairy each day. ? A serving of nuts, seeds, or beans 5 times each week. ? Heart-healthy fats. Healthy fats called Omega-3 fatty acids are found in foods such as flaxseeds and coldwater fish, like sardines, salmon, and mackerel.  Limit how much you eat of the following: ? Canned or prepackaged foods. ? Food that is high in trans fat, such as fried foods. ? Food that is high in saturated fat, such as fatty meat. ? Sweets, desserts, sugary drinks, and other foods with added sugar. ? Full-fat dairy products.  Do not salt foods before eating.  Try to eat at least 2 vegetarian meals each week.  Eat more home-cooked food and less restaurant, buffet, and fast food.  When eating at a restaurant, ask that your food be prepared with less salt or no salt, if possible. What foods are recommended? The items listed may not be a complete list. Talk with your dietitian about   what dietary choices are best for you. Grains Whole-grain or whole-wheat bread. Whole-grain or whole-wheat pasta. Brown rice. Oatmeal. Quinoa. Bulgur. Whole-grain and low-sodium cereals. Pita bread. Low-fat, low-sodium crackers. Whole-wheat flour tortillas. Vegetables Fresh or frozen vegetables (raw, steamed, roasted, or grilled). Low-sodium or reduced-sodium tomato and vegetable juice. Low-sodium or reduced-sodium tomato sauce and tomato paste. Low-sodium or reduced-sodium canned vegetables. Fruits All fresh, dried, or frozen fruit. Canned fruit in natural juice (without  added sugar). Meat and other protein foods Skinless chicken or turkey. Ground chicken or turkey. Pork with fat trimmed off. Fish and seafood. Egg whites. Dried beans, peas, or lentils. Unsalted nuts, nut butters, and seeds. Unsalted canned beans. Lean cuts of beef with fat trimmed off. Low-sodium, lean deli meat. Dairy Low-fat (1%) or fat-free (skim) milk. Fat-free, low-fat, or reduced-fat cheeses. Nonfat, low-sodium ricotta or cottage cheese. Low-fat or nonfat yogurt. Low-fat, low-sodium cheese. Fats and oils Soft margarine without trans fats. Vegetable oil. Low-fat, reduced-fat, or light mayonnaise and salad dressings (reduced-sodium). Canola, safflower, olive, soybean, and sunflower oils. Avocado. Seasoning and other foods Herbs. Spices. Seasoning mixes without salt. Unsalted popcorn and pretzels. Fat-free sweets. What foods are not recommended? The items listed may not be a complete list. Talk with your dietitian about what dietary choices are best for you. Grains Baked goods made with fat, such as croissants, muffins, or some breads. Dry pasta or rice meal packs. Vegetables Creamed or fried vegetables. Vegetables in a cheese sauce. Regular canned vegetables (not low-sodium or reduced-sodium). Regular canned tomato sauce and paste (not low-sodium or reduced-sodium). Regular tomato and vegetable juice (not low-sodium or reduced-sodium). Pickles. Olives. Fruits Canned fruit in a light or heavy syrup. Fried fruit. Fruit in cream or butter sauce. Meat and other protein foods Fatty cuts of meat. Ribs. Fried meat. Bacon. Sausage. Bologna and other processed lunch meats. Salami. Fatback. Hotdogs. Bratwurst. Salted nuts and seeds. Canned beans with added salt. Canned or smoked fish. Whole eggs or egg yolks. Chicken or turkey with skin. Dairy Whole or 2% milk, cream, and half-and-half. Whole or full-fat cream cheese. Whole-fat or sweetened yogurt. Full-fat cheese. Nondairy creamers. Whipped toppings.  Processed cheese and cheese spreads. Fats and oils Butter. Stick margarine. Lard. Shortening. Ghee. Bacon fat. Tropical oils, such as coconut, palm kernel, or palm oil. Seasoning and other foods Salted popcorn and pretzels. Onion salt, garlic salt, seasoned salt, table salt, and sea salt. Worcestershire sauce. Tartar sauce. Barbecue sauce. Teriyaki sauce. Soy sauce, including reduced-sodium. Steak sauce. Canned and packaged gravies. Fish sauce. Oyster sauce. Cocktail sauce. Horseradish that you find on the shelf. Ketchup. Mustard. Meat flavorings and tenderizers. Bouillon cubes. Hot sauce and Tabasco sauce. Premade or packaged marinades. Premade or packaged taco seasonings. Relishes. Regular salad dressings. Where to find more information:  National Heart, Lung, and Blood Institute: www.nhlbi.nih.gov  American Heart Association: www.heart.org Summary  The DASH eating plan is a healthy eating plan that has been shown to reduce high blood pressure (hypertension). It may also reduce your risk for type 2 diabetes, heart disease, and stroke.  With the DASH eating plan, you should limit salt (sodium) intake to 2,300 mg a day. If you have hypertension, you may need to reduce your sodium intake to 1,500 mg a day.  When on the DASH eating plan, aim to eat more fresh fruits and vegetables, whole grains, lean proteins, low-fat dairy, and heart-healthy fats.  Work with your health care provider or diet and nutrition specialist (dietitian) to adjust your eating plan to your   individual calorie needs. This information is not intended to replace advice given to you by your health care provider. Make sure you discuss any questions you have with your health care provider. Document Revised: 11/03/2017 Document Reviewed: 11/14/2016 Elsevier Patient Education  2020 Elsevier Inc.  

## 2020-04-06 NOTE — Assessment & Plan Note (Signed)
Acute, stable with some improvement but not 100%.  Has had Prednisone taper.  Will send in script for Trimacinolone cream to apply to area.  Recommend she avoid taking oral Benadryl, educated on BEERS Criteria.  Recommend instead she take Claritin or Allegra daily.  Avoid areas of exposure to plant.  Wash all clothes well after use and bed sheets.  Return for worsening or ongoing symptoms, could consider additional 5 days Prednisone if needed.

## 2020-04-06 NOTE — Assessment & Plan Note (Signed)
Chronic, ongoing with recent restart of HCTZ.  Initial BP elevated and repeat at goal.  Recommend she monitor BP at home at least a few days a week and document for provider.  Continue current medication regimen and adjust as needed.  BMP today.  Return in December for annual physical.

## 2020-04-06 NOTE — Progress Notes (Signed)
BP 118/68 (BP Location: Left Arm)   Pulse 69   Temp 98.1 F (36.7 C) (Oral)   SpO2 98%    Subjective:    Patient ID: Shelly Bridges, female    DOB: Aug 19, 1946, 74 y.o.   MRN: [id number]  HPI: Shelly Bridges is a 74 y.o. female  Chief Complaint  Patient presents with  . Poison Karlene Einstein    pt states she still has a patch of poison ivy on her left wrist. States she has already taken a round of prednisone    RASH Had exposure to poison sumac one month ago, with rash to left, inner lower arm.  Saw online urgent care and they prescribed 6 days of Prednisone.  Area is still itching, she has been using Benadryl and Calamine at home.  She was not given any cream to put on area during urgent care visit. She has had poison sumac multiple times in past. Duration:  months  Location: arms  Itching: yes Burning: no Redness: yes Oozing: no Scaling: yes Blisters: no Painful: no Fevers: no Change in detergents/soaps/personal care products: no Recent illness: no Recent travel:no History of same: yes Context: better -- just not 100% Alleviating factors: benadryl Treatments attempted:benadryl Shortness of breath: no  Throat/tongue swelling: no Myalgias/arthralgias: no   HYPERTENSION HCTZ restarted at December physical. Hypertension status: stable  Satisfied with current treatment? yes Duration of hypertension: chronic BP monitoring frequency:  not checking BP range:  BP medication side effects:  no Medication compliance: good compliance Aspirin: no Recurrent headaches: no Visual changes: no Palpitations: no Dyspnea: no Chest pain: no Lower extremity edema: no Dizzy/lightheaded: no  Relevant past medical, surgical, family and social history reviewed and updated as indicated. Interim medical history since our last visit reviewed. Allergies and medications reviewed and updated.  Review of Systems  Constitutional: Negative for activity change, appetite change,  diaphoresis, fatigue and fever.  Respiratory: Negative for cough, chest tightness and shortness of breath.   Cardiovascular: Negative for chest pain, palpitations and leg swelling.  Gastrointestinal: Negative.   Skin: Positive for rash.  Neurological: Negative.   Psychiatric/Behavioral: Negative.     Per HPI unless specifically indicated above     Objective:    BP 118/68 (BP Location: Left Arm)   Pulse 69   Temp 98.1 F (36.7 C) (Oral)   SpO2 98%   Wt Readings from Last 3 Encounters:  11/12/19 146 lb 3.2 oz (66.3 kg)  04/30/19 145 lb 12.8 oz (66.1 kg)  10/25/18 144 lb (65.3 kg)    Physical Exam Vitals and nursing note reviewed.  Constitutional:      General: She is awake. She is not in acute distress.    Appearance: She is well-developed and well-groomed. She is not ill-appearing.  HENT:     Head: Normocephalic.     Right Ear: Hearing normal.     Left Ear: Hearing normal.  Eyes:     General: Lids are normal.        Right eye: No discharge.        Left eye: No discharge.     Conjunctiva/sclera: Conjunctivae normal.     Pupils: Pupils are equal, round, and reactive to light.  Neck:     Vascular: No carotid bruit or JVD.  Cardiovascular:     Rate and Rhythm: Normal rate and regular rhythm.     Heart sounds: Normal heart sounds. No murmur. No gallop.   Pulmonary:     Effort: Pulmonary  effort is normal. No accessory muscle usage or respiratory distress.     Breath sounds: Normal breath sounds.  Abdominal:     General: Bowel sounds are normal.     Palpations: Abdomen is soft.  Musculoskeletal:     Cervical back: Normal range of motion and neck supple.     Right lower leg: No edema.     Left lower leg: No edema.  Skin:    General: Skin is warm and dry.     Findings: Rash present.       Neurological:     Mental Status: She is alert and oriented to person, place, and time.  Psychiatric:        Attention and Perception: Attention normal.        Mood and Affect:  Mood normal.        Speech: Speech normal.        Behavior: Behavior normal. Behavior is cooperative.        Thought Content: Thought content normal.    Results for orders placed or performed in visit on 11/12/19  Comprehensive metabolic panel  Result Value Ref Range   Glucose 80 65 - 99 mg/dL   BUN 13 8 - 27 mg/dL   Creatinine, Ser 0.83 0.57 - 1.00 mg/dL   GFR calc non Af Amer 70 >59 mL/min/1.73   GFR calc Af Amer 81 >59 mL/min/1.73   BUN/Creatinine Ratio 16 12 - 28   Sodium 140 134 - 144 mmol/L   Potassium 4.3 3.5 - 5.2 mmol/L   Chloride 101 96 - 106 mmol/L   CO2 26 20 - 29 mmol/L   Calcium 9.1 8.7 - 10.3 mg/dL   Total Protein 6.8 6.0 - 8.5 g/dL   Albumin 4.2 3.7 - 4.7 g/dL   Globulin, Total 2.6 1.5 - 4.5 g/dL   Albumin/Globulin Ratio 1.6 1.2 - 2.2   Bilirubin Total 0.4 0.0 - 1.2 mg/dL   Alkaline Phosphatase 90 39 - 117 IU/L   AST 13 0 - 40 IU/L   ALT 6 0 - 32 IU/L  TSH  Result Value Ref Range   TSH 2.960 0.450 - 4.500 uIU/mL  Lipid Panel w/o Chol/HDL Ratio out  Result Value Ref Range   Cholesterol, Total 193 100 - 199 mg/dL   Triglycerides 51 0 - 149 mg/dL   HDL 71 >39 mg/dL   VLDL Cholesterol Cal 10 5 - 40 mg/dL   LDL Chol Calc (NIH) 112 (H) 0 - 99 mg/dL  Vit D  25 hydroxy (rtn osteoporosis monitoring)  Result Value Ref Range   Vit D, 25-Hydroxy 21.0 (L) 30.0 - 100.0 ng/mL      Assessment & Plan:   Problem List Items Addressed This Visit      Cardiovascular and Mediastinum   Essential hypertension, benign - Primary    Chronic, ongoing with recent restart of HCTZ.  Initial BP elevated and repeat at goal.  Recommend she monitor BP at home at least a few days a week and document for provider.  Continue current medication regimen and adjust as needed.  BMP today.  Return in December for annual physical.      Relevant Orders   Basic metabolic panel     Musculoskeletal and Integument   Poison sumac    Acute, stable with some improvement but not 100%.  Has had  Prednisone taper.  Will send in script for Trimacinolone cream to apply to area.  Recommend she avoid taking oral Benadryl, educated on  BEERS Criteria.  Recommend instead she take Claritin or Allegra daily.  Avoid areas of exposure to plant.  Wash all clothes well after use and bed sheets.  Return for worsening or ongoing symptoms, could consider additional 5 days Prednisone if needed.          Follow up plan: Return in about 7 months (around 11/06/2020) for Annual physical due after December 8th.

## 2020-04-07 LAB — BASIC METABOLIC PANEL
BUN/Creatinine Ratio: 16 (ref 12–28)
BUN: 14 mg/dL (ref 8–27)
CO2: 25 mmol/L (ref 20–29)
Calcium: 9.4 mg/dL (ref 8.7–10.3)
Chloride: 101 mmol/L (ref 96–106)
Creatinine, Ser: 0.89 mg/dL (ref 0.57–1.00)
GFR calc Af Amer: 74 mL/min/{1.73_m2} (ref >59)
GFR calc non Af Amer: 64 mL/min/{1.73_m2} (ref >59)
Glucose: 83 mg/dL (ref 65–99)
Potassium: 4 mmol/L (ref 3.5–5.2)
Sodium: 138 mmol/L (ref 134–144)

## 2020-04-07 NOTE — Progress Notes (Signed)
Contacted via [facility].  Your labs have returned and kidney function + electrolytes look great.  No changes needed.  Have a wonderful day!!

## 2020-07-07 MED ORDER — BETAMETHASONE DIPROPIONATE AUG 0.05 % EX CREA: TOPICAL_CREAM | Freq: Two times a day (BID) | CUTANEOUS | 0 refills | Status: DC

## 2020-11-06 ENCOUNTER — Telehealth: Payer: Self-pay

## 2020-11-06 ENCOUNTER — Ambulatory Visit (INDEPENDENT_AMBULATORY_CARE_PROVIDER_SITE_OTHER): Payer: Medicare PPO

## 2020-11-06 VITALS — Ht 60.0 in | Wt 141.0 lb

## 2020-11-06 DIAGNOSIS — Z0000 Encounter for general adult medical examination without abnormal findings: Secondary | ICD-10-CM | POA: Diagnosis not present

## 2020-11-06 NOTE — Patient Instructions (Signed)
[name] , Thank you for taking time to come for your Medicare Wellness Visit. I appreciate your ongoing commitment to your health goals. Please review the following plan we discussed and let me know if I can assist you in the future.   Screening recommendations/referrals: Colonoscopy: due for cologuard Mammogram: completed 10/23/2019, due 10/22/2021 Bone Density: completed 07/09/2017 Recommended yearly ophthalmology/optometry visit for glaucoma screening and checkup Recommended yearly dental visit for hygiene and checkup  Vaccinations: Influenza vaccine: completed 10/08/2020, due 07/05/2021 Pneumococcal vaccine: completed 10/26/2015 Tdap vaccine: completed 06/04/2018, due 06/04/2028 Shingles vaccine: completed   Covid-19: 03/14/2020, 04/11/2020, 10/08/2020  Advanced directives: Advance directive discussed with you today. Even though you declined this today please call our office should you change your mind and we can give you the proper paperwork for you to fill out.  Conditions/risks identified: none  Next appointment: Follow up in one year for your annual wellness visit    Preventive Care 65 Years and Older, Female Preventive care refers to lifestyle choices and visits with your health care provider that can promote health and wellness. What does preventive care include?  A yearly physical exam. This is also called an annual well check.  Dental exams once or twice a year.  Routine eye exams. Ask your health care provider how often you should have your eyes checked.  Personal lifestyle choices, including:  Daily care of your teeth and gums.  Regular physical activity.  Eating a healthy diet.  Avoiding tobacco and drug use.  Limiting alcohol use.  Practicing safe sex.  Taking low-dose aspirin every day.  Taking vitamin and mineral supplements as recommended by your health care provider. What happens during an annual well check? The services and screenings done by your  health care provider during your annual well check will depend on your age, overall health, lifestyle risk factors, and family history of disease. Counseling  Your health care provider may ask you questions about your:  Alcohol use.  Tobacco use.  Drug use.  Emotional well-being.  Home and relationship well-being.  Sexual activity.  Eating habits.  History of falls.  Memory and ability to understand (cognition).  Work and work Statistician.  Reproductive health. Screening  You may have the following tests or measurements:  Height, weight, and BMI.  Blood pressure.  Lipid and cholesterol levels. These may be checked every 5 years, or more frequently if you are over 70 years old.  Skin check.  Lung cancer screening. You may have this screening every year starting at age 19 if you have a 30-pack-year history of smoking and currently smoke or have quit within the past 15 years.  Fecal occult blood test (FOBT) of the stool. You may have this test every year starting at age 31.  Flexible sigmoidoscopy or colonoscopy. You may have a sigmoidoscopy every 5 years or a colonoscopy every 10 years starting at age 36.  Hepatitis C blood test.  Hepatitis B blood test.  Sexually transmitted disease (STD) testing.  Diabetes screening. This is done by checking your blood sugar (glucose) after you have not eaten for a while (fasting). You may have this done every 1-3 years.  Bone density scan. This is done to screen for osteoporosis. You may have this done starting at age 73.  Mammogram. This may be done every 1-2 years. Talk to your health care provider about how often you should have regular mammograms. Talk with your health care provider about your test results, treatment options, and if necessary,  the need for more tests. Vaccines  Your health care provider may recommend certain vaccines, such as:  Influenza vaccine. This is recommended every year.  Tetanus, diphtheria, and  acellular pertussis (Tdap, Td) vaccine. You may need a Td booster every 10 years.  Zoster vaccine. You may need this after age 39.  Pneumococcal 13-valent conjugate (PCV13) vaccine. One dose is recommended after age 18.  Pneumococcal polysaccharide (PPSV23) vaccine. One dose is recommended after age 67. Talk to your health care provider about which screenings and vaccines you need and how often you need them. This information is not intended to replace advice given to you by your health care provider. Make sure you discuss any questions you have with your health care provider. Document Released: 12/18/2015 Document Revised: 08/10/2016 Document Reviewed: 09/22/2015 Elsevier Interactive Patient Education  2017 [place] Prevention in the Home Falls can cause injuries. They can happen to people of all ages. There are many things you can do to make your home safe and to help prevent falls. What can I do on the outside of my home?  Regularly fix the edges of walkways and driveways and fix any cracks.  Remove anything that might make you trip as you walk through a door, such as a raised step or threshold.  Trim any bushes or trees on the path to your home.  Use bright outdoor lighting.  Clear any walking paths of anything that might make someone trip, such as rocks or tools.  Regularly check to see if handrails are loose or broken. Make sure that both sides of any steps have handrails.  Any raised decks and porches should have guardrails on the edges.  Have any leaves, snow, or ice cleared regularly.  Use sand or salt on walking paths during winter.  Clean up any spills in your garage right away. This includes oil or grease spills. What can I do in the bathroom?  Use night lights.  Install grab bars by the toilet and in the tub and shower. Do not use towel bars as grab bars.  Use non-skid mats or decals in the tub or shower.  If you need to sit down in the shower, use  a plastic, non-slip stool.  Keep the floor dry. Clean up any water that spills on the floor as soon as it happens.  Remove soap buildup in the tub or shower regularly.  Attach bath mats securely with double-sided non-slip rug tape.  Do not have throw rugs and other things on the floor that can make you trip. What can I do in the bedroom?  Use night lights.  Make sure that you have a light by your bed that is easy to reach.  Do not use any sheets or blankets that are too big for your bed. They should not hang down onto the floor.  Have a firm chair that has side arms. You can use this for support while you get dressed.  Do not have throw rugs and other things on the floor that can make you trip. What can I do in the kitchen?  Clean up any spills right away.  Avoid walking on wet floors.  Keep items that you use a lot in easy-to-reach places.  If you need to reach something above you, use a strong step stool that has a grab bar.  Keep electrical cords out of the way.  Do not use floor polish or wax that makes floors slippery. If you must use  wax, use non-skid floor wax.  Do not have throw rugs and other things on the floor that can make you trip. What can I do with my stairs?  Do not leave any items on the stairs.  Make sure that there are handrails on both sides of the stairs and use them. Fix handrails that are broken or loose. Make sure that handrails are as long as the stairways.  Check any carpeting to make sure that it is firmly attached to the stairs. Fix any carpet that is loose or worn.  Avoid having throw rugs at the top or bottom of the stairs. If you do have throw rugs, attach them to the floor with carpet tape.  Make sure that you have a light switch at the top of the stairs and the bottom of the stairs. If you do not have them, ask someone to add them for you. What else can I do to help prevent falls?  Wear shoes that:  Do not have high heels.  Have  rubber bottoms.  Are comfortable and fit you well.  Are closed at the toe. Do not wear sandals.  If you use a stepladder:  Make sure that it is fully opened. Do not climb a closed stepladder.  Make sure that both sides of the stepladder are locked into place.  Ask someone to hold it for you, if possible.  Clearly mark and make sure that you can see:  Any grab bars or handrails.  First and last steps.  Where the edge of each step is.  Use tools that help you move around (mobility aids) if they are needed. These include:  Canes.  Walkers.  Scooters.  Crutches.  Turn on the lights when you go into a dark area. Replace any light bulbs as soon as they burn out.  Set up your furniture so you have a clear path. Avoid moving your furniture around.  If any of your floors are uneven, fix them.  If there are any pets around you, be aware of where they are.  Review your medicines with your doctor. Some medicines can make you feel dizzy. This can increase your chance of falling. Ask your doctor what other things that you can do to help prevent falls. This information is not intended to replace advice given to you by your health care provider. Make sure you discuss any questions you have with your health care provider. Document Released: 09/17/2009 Document Revised: 04/28/2016 Document Reviewed: 12/26/2014 Elsevier Interactive Patient Education  2017 [employer].

## 2020-11-06 NOTE — Progress Notes (Signed)
I connected with Shelly Bridges today by telephone and verified that I am speaking with the correct person using two identifiers. Location patient: home Location provider: work Persons participating in the virtual visit: [name], [name].   I discussed the limitations, risks, security and privacy concerns of performing an evaluation and management service by telephone and the availability of in person appointments. I also discussed with the patient that there may be a patient responsible charge related to this service. The patient expressed understanding and verbally consented to this telephonic visit.    Interactive audio and video telecommunications were attempted between this provider and patient, however failed, due to patient having technical difficulties OR patient did not have access to video capability.  We continued and completed visit with audio only.     Vital signs may be patient reported or missing.  Subjective:   Shelly Bridges is a 74 y.o. female who presents for Medicare Annual (Subsequent) preventive examination.  Review of Systems     Cardiac Risk Factors include: advanced age (>71men, >73 women);dyslipidemia     Objective:    Today's Vitals   11/06/20 1031  Weight: 141 lb (64 kg)  Height: 5' (1.524 m)   Body mass index is 27.54 kg/m.  Advanced Directives 11/06/2020 10/28/2019 10/25/2018 10/11/2017 09/30/2016  Does Patient Have a Medical Advance Directive? No No Yes Yes No  Type of Advance Directive - Public librarian;Living will - -  Does patient want to make changes to medical advance directive? - - Yes (MAU/Ambulatory/Procedural Areas - Information given) Yes (MAU/Ambulatory/Procedural Areas - Information given) -  Copy of [place] in Chart? - - No - copy requested - -    Current Medications (verified) Outpatient Encounter Medications as of 11/06/2020  Medication Sig  . albuterol (PROVENTIL  HFA;VENTOLIN HFA) 108 (90 Base) MCG/ACT inhaler INHALE 2 PUFFS INTO THE LUNGS EVERY 4 HOURS AS NEEDED FOR WHEEZING OR SHORTNESS OF BREATH  . augmented betamethasone dipropionate (DIPROLENE AF) 0.05 % cream Apply topically 2 (two) times daily.  . budesonide-formoterol (SYMBICORT) 160-4.5 MCG/ACT inhaler Inhale 2 puffs into the lungs 2 (two) times daily.  . hydrochlorothiazide (HYDRODIURIL) 25 MG tablet Take 1 tablet (25 mg total) by mouth daily.  . hydrOXYzine (ATARAX/VISTARIL) 10 MG tablet Take 1 tablet (10 mg total) by mouth 3 (three) times daily as needed.  . mupirocin ointment (BACTROBAN) 2 % Place 1 application into the nose 2 (two) times daily.  . sertraline (ZOLOFT) 100 MG tablet Take 1 tablet (100 mg total) by mouth daily.  Marland Kitchen triamcinolone cream (KENALOG) 0.1 % Apply 1 application topically 2 (two) times daily.   No facility-administered encounter medications on file as of 11/06/2020.    Allergies (verified) Patient has no known allergies.   History: Past Medical History:  Diagnosis Date  . Anxiety   . Asthma   . Lipoma   . Neutropenia ([place])   . Osteoporosis   . Vitamin D deficiency disease    History reviewed. No pertinent surgical history. Family History  Problem Relation Age of Onset  . Dementia Mother   . Hypertension Mother   . Osteoporosis Mother   . Stroke Mother        mini stroke  . Cancer Father        brain tumor and lung  . Hypertension Father   . Emphysema Father   . COPD Neg Hx   . Heart disease Neg Hx    Social  History   Socioeconomic History  . Marital status: Divorced    Spouse name: Not on file  . Number of children: 3  . Years of education: Not on file  . Highest education level: Not on file  Occupational History  . Not on file  Tobacco Use  . Smoking status: Former Smoker    Packs/day: 1.00    Years: 20.00    Pack years: 20.00    Types: Cigarettes    Quit date: 06/17/1985    Years since quitting: 35.4  . Smokeless tobacco: Never Used    Vaping Use  . Vaping Use: Never used  Substance and Sexual Activity  . Alcohol use: No  . Drug use: No  . Sexual activity: Not Currently  Other Topics Concern  . Not on file  Social History Narrative  . Not on file   Social Determinants of Health   Financial Resource Strain: Low Risk   . Difficulty of Paying Living Expenses: Not hard at all  Food Insecurity: No Food Insecurity  . Worried About Charity fundraiser in the Last Year: Never true  . Ran Out of Food in the Last Year: Never true  Transportation Needs: No Transportation Needs  . Lack of Transportation (Medical): No  . Lack of Transportation (Non-Medical): No  Physical Activity: Sufficiently Active  . Days of Exercise per Week: 7 days  . Minutes of Exercise per Session: 30 min  Stress: No Stress Concern Present  . Feeling of Stress : Not at all  Social Connections:   . Frequency of Communication with Friends and Family: Not on file  . Frequency of Social Gatherings with Friends and Family: Not on file  . Attends Religious Services: Not on file  . Active Member of Clubs or Organizations: Not on file  . Attends Archivist Meetings: Not on file  . Marital Status: Not on file    Tobacco Counseling Counseling given: Not Answered   Clinical Intake:  Pre-visit preparation completed: Yes  Pain : No/denies pain     Nutritional Status: BMI 25 -29 Overweight Nutritional Risks: None Diabetes: No  How often do you need to have someone help you when you read instructions, pamphlets, or other written materials from your doctor or pharmacy?: 1 - Never What is the last grade level you completed in school?: 12th grade  Diabetic? no  Interpreter Needed?: No  Information entered by :: NAllen LPN   Activities of Daily Living In your present state of health, do you have any difficulty performing the following activities: 11/06/2020 11/12/2019  Hearing? N N  Vision? N N  Difficulty concentrating or making  decisions? N N  Walking or climbing stairs? N N  Dressing or bathing? N N  Doing errands, shopping? N N  Preparing Food and eating ? N -  Using the Toilet? N -  In the past six months, have you accidently leaked urine? N -  Do you have problems with loss of bowel control? N -  Managing your Medications? N -  Managing your Finances? N -  Housekeeping or managing your Housekeeping? N -  Some recent data might be hidden    Patient Care Team: Venita Lick, NP as PCP - General (Nurse Practitioner)  Indicate any recent Medical Services you may have received from other than [facility] providers in the past year (date may be approximate).     Assessment:   This is a routine wellness examination for Shelly Bridges.  Hearing/Vision  screen  Hearing Screening   125Hz  250Hz  500Hz  1000Hz  2000Hz  3000Hz  4000Hz  6000Hz  8000Hz   Right ear:           Left ear:           Vision Screening Comments: Regular eye exams, Dr. Ellin Mayhew  Dietary issues and exercise activities discussed: Current Exercise Habits: Home exercise routine, Type of exercise: calisthenics, Time (Minutes): 30, Frequency (Times/Week): 7, Weekly Exercise (Minutes/Week): 210  Goals    . Increase water intake     Recommend drinking at least 4-5 glasses of water a day    . Patient Stated     11/06/2020, no goals      Depression Screen PHQ 2/9 Scores 11/06/2020 11/12/2019 10/28/2019 04/30/2019 10/25/2018 04/11/2018 10/11/2017  PHQ - 2 Score 0 2 0 2 2 0 0  PHQ- 9 Score - 3 - 2 5 3  0    Fall Risk Fall Risk  11/06/2020 10/28/2019 10/25/2018 04/11/2018 10/11/2017  Falls in the past year? 0 0 0 No No  Number falls in past yr: - 0 0 - -  Injury with Fall? - 0 0 - -  Follow up Falls evaluation completed;Education provided;Falls prevention discussed - - - -    Any stairs in or around the home? No  If so, are there any without handrails? n/a Home free of loose throw rugs in walkways, pet beds, electrical cords, etc? Yes  Adequate lighting in your  home to reduce risk of falls? Yes   ASSISTIVE DEVICES UTILIZED TO PREVENT FALLS:  Life alert? No  Use of a cane, walker or w/c? No  Grab bars in the bathroom? No  Shower chair or bench in shower? Yes  Elevated toilet seat or a handicapped toilet? No   TIMED UP AND GO:  Was the test performed? No .     Cognitive Function:     6CIT Screen 11/06/2020 10/25/2018 10/11/2017  What Year? 0 points 0 points 0 points  What month? 0 points 0 points 0 points  What time? 0 points 0 points 0 points  Count back from 20 0 points 0 points 0 points  Months in reverse 2 points 0 points 0 points  Repeat phrase 2 points 0 points 2 points  Total Score 4 0 2    Immunizations Immunization History  Administered Date(s) Administered  . Fluad Quad(high Dose 65+) 09/19/2019  . Influenza, High Dose Seasonal PF 10/25/2016, 10/11/2017, 10/05/2018  . Influenza,inj,Quad PF,6+ Mos 10/26/2015  . Influenza-Unspecified 10/08/2020  . Moderna SARS-COVID-2 Vaccination 03/14/2020, 04/11/2020, 10/08/2020  . Pneumococcal Conjugate-13 07/03/2014  . Pneumococcal Polysaccharide-23 10/26/2015  . Td 12/05/2008  . Tdap 06/04/2018  . Zoster Recombinat (Shingrix) 01/28/2019, 07/02/2019    TDAP status: Up to date Flu Vaccine status: Up to date Pneumococcal vaccine status: Up to date Covid-19 vaccine status: Completed vaccines  Qualifies for Shingles Vaccine? Yes   Zostavax completed No   Shingrix Completed?: Yes  Screening Tests Health Maintenance  Topic Date Due  . COLONOSCOPY  Never done  . MAMMOGRAM  10/22/2021  . TETANUS/TDAP  06/04/2028  . INFLUENZA VACCINE  Completed  . COVID-19 Vaccine  Completed  . Hepatitis C Screening  Completed  . PNA vac Low Risk Adult  Completed  . DEXA SCAN  Addressed    Health Maintenance  Health Maintenance Due  Topic Date Due  . COLONOSCOPY  Never done    Colorectal cancer screening: due Mammogram status: Completed 10/23/2019. Repeat every 2 year Bone Density  status: Completed  07/09/2014.   Lung Cancer Screening: (Low Dose CT Chest recommended if Age 75-80 years, 30 pack-year currently smoking OR have quit w/in 15years.) does not qualify.   Lung Cancer Screening Referral: no  Additional Screening:  Hepatitis C Screening: does qualify; Completed 10/11/2017  Vision Screening: Recommended annual ophthalmology exams for early detection of glaucoma and other disorders of the eye. Is the patient up to date with their annual eye exam?  Yes  Who is the provider or what is the name of the office in which the patient attends annual eye exams? Dr. Ellin Mayhew If pt is not established with a provider, would they like to be referred to a provider to establish care? No .   Dental Screening: Recommended annual dental exams for proper oral hygiene  Community Resource Referral / Chronic Care Management: CRR required this visit?  No   CCM required this visit?  No      Plan:     I have personally reviewed and noted the following in the patient's chart:   . Medical and social history . Use of alcohol, tobacco or illicit drugs  . Current medications and supplements . Functional ability and status . Nutritional status . Physical activity . Advanced directives . List of other physicians . Hospitalizations, surgeries, and ER visits in previous 12 months . Vitals . Screenings to include cognitive, depression, and falls . Referrals and appointments  In addition, I have reviewed and discussed with patient certain preventive protocols, quality metrics, and best practice recommendations. A written personalized care plan for preventive services as well as general preventive health recommendations were provided to patient.     Kellie Simmering, LPN   95/02/2022   Nurse Notes:

## 2020-11-06 NOTE — Telephone Encounter (Signed)
Patient consented to a telehealth visit.

## 2020-11-13 ENCOUNTER — Encounter: Payer: Self-pay | Admitting: Nurse Practitioner

## 2020-11-13 ENCOUNTER — Other Ambulatory Visit: Payer: Self-pay

## 2020-11-13 ENCOUNTER — Ambulatory Visit (INDEPENDENT_AMBULATORY_CARE_PROVIDER_SITE_OTHER): Payer: Medicare PPO | Admitting: Nurse Practitioner

## 2020-11-13 VITALS — BP 132/78 | HR 64 | Temp 98.0°F | Ht 59.17 in | Wt 140.0 lb

## 2020-11-13 DIAGNOSIS — I10 Essential (primary) hypertension: Secondary | ICD-10-CM | POA: Diagnosis not present

## 2020-11-13 DIAGNOSIS — F419 Anxiety disorder, unspecified: Secondary | ICD-10-CM | POA: Diagnosis not present

## 2020-11-13 DIAGNOSIS — E7801 Familial hypercholesterolemia: Secondary | ICD-10-CM

## 2020-11-13 DIAGNOSIS — J410 Simple chronic bronchitis: Secondary | ICD-10-CM

## 2020-11-13 DIAGNOSIS — Z1231 Encounter for screening mammogram for malignant neoplasm of breast: Secondary | ICD-10-CM

## 2020-11-13 DIAGNOSIS — Z0000 Encounter for general adult medical examination without abnormal findings: Secondary | ICD-10-CM

## 2020-11-13 DIAGNOSIS — Z1211 Encounter for screening for malignant neoplasm of colon: Secondary | ICD-10-CM | POA: Diagnosis not present

## 2020-11-13 DIAGNOSIS — E559 Vitamin D deficiency, unspecified: Secondary | ICD-10-CM

## 2020-11-13 DIAGNOSIS — M810 Age-related osteoporosis without current pathological fracture: Secondary | ICD-10-CM

## 2020-11-13 DIAGNOSIS — Z87891 Personal history of nicotine dependence: Secondary | ICD-10-CM

## 2020-11-13 MED ORDER — BUSPIRONE HCL 5 MG PO TABS: 5.0000 mg | ORAL_TABLET | Freq: Two times a day (BID) | ORAL | 3 refills | Status: DC | PRN

## 2020-11-13 MED ORDER — BUDESONIDE-FORMOTEROL FUMARATE 160-4.5 MCG/ACT IN AERO
2.0000 | INHALATION_SPRAY | Freq: Two times a day (BID) | RESPIRATORY_TRACT | 4 refills | Status: DC
Start: 2020-11-13 — End: 2021-03-22

## 2020-11-13 MED ORDER — SERTRALINE HCL 100 MG PO TABS: 100.0000 mg | ORAL_TABLET | Freq: Every day | ORAL | 4 refills | Status: DC

## 2020-11-13 NOTE — Assessment & Plan Note (Signed)
Chronic, ongoing.  Check level today and continue supplement.  DEXA order in.

## 2020-11-13 NOTE — Assessment & Plan Note (Signed)
Chronic, ongoing without medication.  Lipid panel today.  She wishes to focus on diet and exercise.

## 2020-11-13 NOTE — Progress Notes (Signed)
BP 132/78   Pulse 64   Temp 98 F (36.7 C) (Oral)   Ht 4' 11.17" (1.503 m)   Wt 140 lb (63.5 kg)   SpO2 98%   BMI 28.11 kg/m    Subjective:    Patient ID: Shelly Bridges, female    DOB: 1946/03/24, 74 y.o.   MRN: [id number]  HPI: Shelly Bridges is a 74 y.o. female presenting on 11/13/2020 for comprehensive medical examination. Current medical complaints include:none  She currently lives with: self Menopausal Symptoms: no   HYPERTENSION Has not taken HCTZ in several months.   Hypertension status: stable  Satisfied with current treatment? yes Duration of hypertension: chronic BP monitoring frequency:  rarely BP range: 130/70 range BP medication side effects:  no Medication compliance: good compliance Previous BP meds: HCTZ Aspirin: no Recurrent headaches: no Visual changes: no Palpitations: no Dyspnea: no Chest pain: no Lower extremity edema: no Dizzy/lightheaded: no  COPD Continues on Symbicort daily and Albuterol as needed with good control.  Last exacerbation was 01/16/2020.  Past smoker, has not smoked for 30 years.  Smoked for about 20 years on and off. COPD status: stable Satisfied with current treatment?: yes Oxygen use: no Dyspnea frequency:  Cough frequency:  Rescue inhaler frequency:   Limitation of activity: no Productive cough:  Last Spirometry:  Pneumovax: Up to Date Influenza: Up to Date   ANXIETY/STRESS Taking Sertraline 100 MG daily and has Vistaril she uses as needed.   Duration:stable Anxious mood: no  Excessive worrying: no Irritability: no  Sweating: no Nausea: no Palpitations:no Hyperventilation: no Panic attacks: no Agoraphobia: no  Obscessions/compulsions: no Depressed mood: no Depression screen [facility] 2/9 11/13/2020 11/06/2020 11/12/2019 10/28/2019 04/30/2019  Decreased Interest 0 0 1 0 1  Down, Depressed, Hopeless 0 0 1 0 1  PHQ - 2 Score 0 0 2 0 2  Altered sleeping - - 0 - 0  Tired, decreased energy - - 1 - 0   Change in appetite - - 0 - 0  Feeling bad or failure about yourself  - - 0 - 0  Trouble concentrating - - 0 - 0  Moving slowly or fidgety/restless - - 0 - 0  Suicidal thoughts - - 0 - 0  PHQ-9 Score - - 3 - 2  Difficult doing work/chores - - Not difficult at all - Not difficult at all  Some recent data might be hidden   Anhedonia: no Weight changes: no Insomnia: none Hypersomnia: no Fatigue/loss of energy: no Feelings of worthlessness: no Feelings of guilt: no Impaired concentration/indecisiveness: no Suicidal ideations: no  Crying spells: no Recent Stressors/Life Changes: no   Relationship problems: no   Family stress: no     Financial stress: no    Job stress: no    Recent death/loss: no  The patient does not have a history of falls. I did not complete a risk assessment for falls. A plan of care for falls was not documented.   Past Medical History:  Past Medical History:  Diagnosis Date  . Anxiety   . Asthma   . Lipoma   . Neutropenia ([place])   . Osteoporosis   . Vitamin D deficiency disease     Surgical History:  History reviewed. No pertinent surgical history.  Medications:  Current Outpatient Medications on File Prior to Visit  Medication Sig  . albuterol (PROVENTIL HFA;VENTOLIN HFA) 108 (90 Base) MCG/ACT inhaler INHALE 2 PUFFS INTO THE LUNGS EVERY 4 HOURS AS NEEDED FOR WHEEZING OR  SHORTNESS OF BREATH  . augmented betamethasone dipropionate (DIPROLENE AF) 0.05 % cream Apply topically 2 (two) times daily.  . mupirocin ointment (BACTROBAN) 2 % Place 1 application into the nose 2 (two) times daily.  Marland Kitchen triamcinolone cream (KENALOG) 0.1 % Apply 1 application topically 2 (two) times daily.   No current facility-administered medications on file prior to visit.    Allergies:  No Known Allergies  Social History:  Social History   Socioeconomic History  . Marital status: Divorced    Spouse name: Not on file  . Number of children: 3  . Years of education: Not  on file  . Highest education level: Not on file  Occupational History  . Not on file  Tobacco Use  . Smoking status: Former Smoker    Packs/day: 1.00    Years: 20.00    Pack years: 20.00    Types: Cigarettes    Quit date: 06/17/1985    Years since quitting: 35.4  . Smokeless tobacco: Never Used  Vaping Use  . Vaping Use: Never used  Substance and Sexual Activity  . Alcohol use: No  . Drug use: No  . Sexual activity: Not Currently  Other Topics Concern  . Not on file  Social History Narrative  . Not on file   Social Determinants of Health   Financial Resource Strain: Low Risk   . Difficulty of Paying Living Expenses: Not hard at all  Food Insecurity: No Food Insecurity  . Worried About Charity fundraiser in the Last Year: Never true  . Ran Out of Food in the Last Year: Never true  Transportation Needs: No Transportation Needs  . Lack of Transportation (Medical): No  . Lack of Transportation (Non-Medical): No  Physical Activity: Sufficiently Active  . Days of Exercise per Week: 7 days  . Minutes of Exercise per Session: 30 min  Stress: No Stress Concern Present  . Feeling of Stress : Not at all  Social Connections: Not on file  Intimate Partner Violence: Not on file   Social History   Tobacco Use  Smoking Status Former Smoker  . Packs/day: 1.00  . Years: 20.00  . Pack years: 20.00  . Types: Cigarettes  . Quit date: 06/17/1985  . Years since quitting: 35.4  Smokeless Tobacco Never Used   Social History   Substance and Sexual Activity  Alcohol Use No    Family History:  Family History  Problem Relation Age of Onset  . Dementia Mother   . Hypertension Mother   . Osteoporosis Mother   . Stroke Mother        mini stroke  . Cancer Father        brain tumor and lung  . Hypertension Father   . Emphysema Father   . COPD Neg Hx   . Heart disease Neg Hx     Past medical history, surgical history, medications, allergies, family history and social history  reviewed with patient today and changes made to appropriate areas of the chart.   Review of Systems - none All other ROS negative except what is listed above and in the HPI.      Objective:    BP 132/78   Pulse 64   Temp 98 F (36.7 C) (Oral)   Ht 4' 11.17" (1.503 m)   Wt 140 lb (63.5 kg)   SpO2 98%   BMI 28.11 kg/m   Wt Readings from Last 3 Encounters:  11/13/20 140 lb (63.5 kg)  11/06/20  141 lb (64 kg)  11/12/19 146 lb 3.2 oz (66.3 kg)    Physical Exam Constitutional:      General: She is awake. She is not in acute distress.    Appearance: She is well-developed. She is not ill-appearing.  HENT:     Head: Normocephalic and atraumatic.     Right Ear: Hearing, tympanic membrane, ear canal and external ear normal. No drainage.     Left Ear: Hearing, tympanic membrane, ear canal and external ear normal. No drainage.     Nose: Nose normal.     Right Sinus: No maxillary sinus tenderness or frontal sinus tenderness.     Left Sinus: No maxillary sinus tenderness or frontal sinus tenderness.     Mouth/Throat:     Mouth: Mucous membranes are moist.     Pharynx: Oropharynx is clear. Uvula midline. No pharyngeal swelling, oropharyngeal exudate or posterior oropharyngeal erythema.  Eyes:     General: Lids are normal.        Right eye: No discharge.        Left eye: No discharge.     Extraocular Movements: Extraocular movements intact.     Conjunctiva/sclera: Conjunctivae normal.     Pupils: Pupils are equal, round, and reactive to light.     Visual Fields: Right eye visual fields normal and left eye visual fields normal.  Neck:     Thyroid: No thyromegaly.     Vascular: No carotid bruit.     Trachea: Trachea normal.  Cardiovascular:     Rate and Rhythm: Normal rate and regular rhythm.     Heart sounds: Normal heart sounds. No murmur heard. No gallop.   Pulmonary:     Effort: Pulmonary effort is normal. No accessory muscle usage or respiratory distress.     Breath sounds:  Normal breath sounds.  Chest:     Comments: Deferred per patient request. Abdominal:     General: Bowel sounds are normal.     Palpations: Abdomen is soft. There is no hepatomegaly or splenomegaly.     Tenderness: There is no abdominal tenderness.  Musculoskeletal:        General: Normal range of motion.     Cervical back: Normal range of motion and neck supple.     Right lower leg: No edema.     Left lower leg: No edema.  Lymphadenopathy:     Head:     Right side of head: No submental, submandibular, tonsillar, preauricular or posterior auricular adenopathy.     Left side of head: No submental, submandibular, tonsillar, preauricular or posterior auricular adenopathy.     Cervical: No cervical adenopathy.  Skin:    General: Skin is warm and dry.     Capillary Refill: Capillary refill takes less than 2 seconds.     Findings: No rash.  Neurological:     Mental Status: She is alert and oriented to person, place, and time.     Cranial Nerves: Cranial nerves are intact.     Gait: Gait is intact.     Deep Tendon Reflexes: Reflexes are normal and symmetric.     Reflex Scores:      Brachioradialis reflexes are 2+ on the right side and 2+ on the left side.      Patellar reflexes are 2+ on the right side and 2+ on the left side. Psychiatric:        Attention and Perception: Attention normal.        Mood and Affect:  Mood normal.        Speech: Speech normal.        Behavior: Behavior normal. Behavior is cooperative.        Thought Content: Thought content normal.        Judgment: Judgment normal.    Results for orders placed or performed in visit on 09/38/18  Basic metabolic panel  Result Value Ref Range   Glucose 83 65 - 99 mg/dL   BUN 14 8 - 27 mg/dL   Creatinine, Ser 0.89 0.57 - 1.00 mg/dL   GFR calc non Af Amer 64 >59 mL/min/1.73   GFR calc Af Amer 74 >59 mL/min/1.73   BUN/Creatinine Ratio 16 12 - 28   Sodium 138 134 - 144 mmol/L   Potassium 4.0 3.5 - 5.2 mmol/L   Chloride  101 96 - 106 mmol/L   CO2 25 20 - 29 mmol/L   Calcium 9.4 8.7 - 10.3 mg/dL      Assessment & Plan:   Problem List Items Addressed This Visit      Cardiovascular and Mediastinum   Essential hypertension, benign    Chronic, ongoing with no medications at this time.  BP at goal for age.  Will continue diet focus and if needed in future restart HCTZ which has worked well in past, or add on ARB, avoid ACE due to COPD.  Labs today to include CBC, CMP, TSH.  Return in 6 months.      Relevant Orders   Comprehensive metabolic panel   TSH     Respiratory   COPD (chronic obstructive pulmonary disease) (HCC) - Primary    Chronic, ongoing.  No recent exacerbations.  Continue current medication regimen and adjust as needed.  Plan for spirometry at next visit.  CBC today.  Refills sent in.      Relevant Medications   budesonide-formoterol (SYMBICORT) 160-4.5 MCG/ACT inhaler   Other Relevant Orders   CBC with Differential/Platelet     Musculoskeletal and Integument   Osteoporosis    Note recent DEXA on Epic, will obtain repeat with her mammogram this year.  Recommend ensuring she take daily Vitamin D, check level today.  Consider medication initiation if ongoing osteoporosis noted on bone scan.      Relevant Orders   DG Bone Density     Other   Anxiety    Chronic, stable.  Denies SI/HI.  Will continue Sertraline, refills sent, and change Vistaril to Buspar as needed due to her age over 89.  Buspar would be safer alternative to take for increased anxiety.   Return to office in 6 months for follow-up.       Relevant Medications   busPIRone (BUSPAR) 5 MG tablet   sertraline (ZOLOFT) 100 MG tablet   Vitamin D deficiency disease    Chronic, ongoing.  Check level today and continue supplement.  DEXA order in.      Relevant Orders   VITAMIN D 25 Hydroxy (Vit-D Deficiency, Fractures)   Hyperlipidemia    Chronic, ongoing without medication.  Lipid panel today.  She wishes to focus on diet  and exercise.        Relevant Orders   Lipid Panel w/o Chol/HDL Ratio   Former light tobacco smoker    Quit over 30 years ago, recommend continued cessation.       Other Visit Diagnoses    Encounter for annual health examination       Annual labs today to include CBC, CMP, TSH, lipid  Encounter for screening mammogram for malignant neoplasm of breast       Mammogram ordered   Relevant Orders   MM DIGITAL SCREENING BILATERAL   Colon cancer screening       Cologuard order in.   Relevant Orders   Cologuard       Follow up plan: Return in about 6 months (around 05/14/2021) for COPD, HTN, MOOD -- with spirometry.   LABORATORY TESTING:  - Pap smear: not applicable  IMMUNIZATIONS:   - Tdap: Tetanus vaccination status reviewed: last tetanus booster within 10 years. - Influenza: Up to date - Pneumovax: Up to date - Prevnar: Up to date - HPV: Not applicable - Zostavax vaccine: Up to date  SCREENING: -Mammogram: Ordered today - Colonoscopy: Ordered today -- ordered cologuard - Bone Density: Ordered today  -Hearing Test: Not applicable  -Spirometry: obtain next visit   PATIENT COUNSELING:   Advised to take 1 mg of folate supplement per day if capable of pregnancy.   Sexuality: Discussed sexually transmitted diseases, partner selection, use of condoms, avoidance of unintended pregnancy  and contraceptive alternatives.   Advised to avoid cigarette smoking.  I discussed with the patient that most people either abstain from alcohol or drink within safe limits (<=14/week and <=4 drinks/occasion for males, <=7/weeks and <= 3 drinks/occasion for females) and that the risk for alcohol disorders and other health effects rises proportionally with the number of drinks per week and how often a drinker exceeds daily limits.  Discussed cessation/primary prevention of drug use and availability of treatment for abuse.   Diet: Encouraged to adjust caloric intake to maintain  or achieve  ideal body weight, to reduce intake of dietary saturated fat and total fat, to limit sodium intake by avoiding high sodium foods and not adding table salt, and to maintain adequate dietary potassium and calcium preferably from fresh fruits, vegetables, and low-fat dairy products.    stressed the importance of regular exercise  Injury prevention: Discussed safety belts, safety helmets, smoke detector, smoking near bedding or upholstery.   Dental health: Discussed importance of regular tooth brushing, flossing, and dental visits.    NEXT PREVENTATIVE PHYSICAL DUE IN 1 YEAR. Return in about 6 months (around 05/14/2021) for COPD, HTN, MOOD -- with spirometry.

## 2020-11-13 NOTE — Assessment & Plan Note (Signed)
Chronic, stable.  Will continue Sertraline, refills sent, and change Vistaril to Buspar as needed due to her age over 70.  Buspar would be safer alternative to take for increased anxiety.  Denies SI/HI.  Return to office in 6 months for follow-up.

## 2020-11-13 NOTE — Assessment & Plan Note (Signed)
Chronic, ongoing with no medications at this time.  BP at goal for age.  Will continue diet focus and if needed in future restart HCTZ which has worked well in past, or add on ARB, avoid ACE due to COPD.  Labs today to include CBC, CMP, TSH.  Return in 6 months.

## 2020-11-13 NOTE — Assessment & Plan Note (Signed)
Quit over 30 years ago, recommend continued cessation.

## 2020-11-13 NOTE — Assessment & Plan Note (Signed)
Chronic, ongoing.  No recent exacerbations.  Continue current medication regimen and adjust as needed.  Plan for spirometry at next visit.  CBC today.  Refills sent in.

## 2020-11-13 NOTE — Patient Instructions (Addendum)
[facility] at [facility]  Address: [address] [place], [place], [place] [address]  Phone: [phone] -- FOR BONE SCAN AND MAMMOGRAM   Mammogram A mammogram is an X-ray of the breasts that is done to check for changes that are not normal. This test can screen for and find any changes that may suggest breast cancer. Mammograms are regularly done on women. A man may have a mammogram if he has a lump or swelling in his breast. This test can also help to find other changes and variations in the breast. Tell a doctor:  About any allergies you have.  If you have breast implants.  If you have had previous breast disease, biopsy, or surgery.  If you are breastfeeding.  If you are younger than age 1.  If you have a family history of breast cancer.  Whether you are pregnant or may be pregnant. What are the risks? Generally, this is a safe procedure. However, problems may occur, including:  Exposure to radiation. Radiation levels are very low with this test.  The results being misinterpreted.  The need for further tests.  The inability of the mammogram to detect certain cancers. What happens before the procedure?  Have this test done about 1-2 weeks after your period. This is usually when your breasts are the least tender.  If you are visiting a new doctor or clinic, send any past mammogram images to your new doctor's office.  Wash your breasts and under your arms the day of the test.  Do not use deodorants, perfumes, lotions, or powders on the day of the test.  Take off any jewelry from your neck.  Wear clothes that you can change into and out of easily. What happens during the procedure?   You will undress from the waist up. You will put on a gown.  You will stand in front of the X-ray machine.  Each breast will be placed between two plastic or glass plates. The plates will press down on your breast for a few seconds. Try to stay as relaxed as  possible. This does not cause any harm to your breasts. Any discomfort you feel will be very brief.  X-rays will be taken from different angles of each breast. The procedure may vary among doctors and hospitals. What happens after the procedure?  The mammogram will be read by a specialist (radiologist).  You may need to do certain parts of the test again. This depends on the quality of the images.  Ask when your test results will be ready. Make sure you get your test results.  You may go back to your normal activities. Summary  A mammogram is a low energy X-ray of the breasts that is done to check for abnormal changes. A man may have this test if he has a lump or swelling in his breast.  Before the procedure, tell your doctor about any breast problems that you have had in the past.  Have this test done about 1-2 weeks after your period.  For the test, each breast will be placed between two plastic or glass plates. The plates will press down on your breast for a few seconds.  The mammogram will be read by a specialist (radiologist). Ask when your test results will be ready. Make sure you get your test results. This information is not intended to replace advice given to you by your health care provider. Make sure you discuss any questions you have with your health  care provider. Document Revised: 07/12/2018 Document Reviewed: 07/12/2018 Elsevier Patient Education  [employer].

## 2020-11-13 NOTE — Assessment & Plan Note (Signed)
Note recent DEXA on Epic, will obtain repeat with her mammogram this year.  Recommend ensuring she take daily Vitamin D, check level today.  Consider medication initiation if ongoing osteoporosis noted on bone scan.

## 2020-11-13 NOTE — Assessment & Plan Note (Signed)
Chronic, stable.  Denies SI/HI.  Will continue Sertraline, refills sent, and change Vistaril to Buspar as needed due to her age over 30.  Buspar would be safer alternative to take for increased anxiety.   Return to office in 6 months for follow-up.

## 2020-11-14 LAB — COMPREHENSIVE METABOLIC PANEL
ALT: 9 IU/L (ref 0–32)
AST: 15 IU/L (ref 0–40)
Albumin/Globulin Ratio: 1.9 (ref 1.2–2.2)
Albumin: 4.5 g/dL (ref 3.7–4.7)
Alkaline Phosphatase: 81 IU/L (ref 44–121)
BUN/Creatinine Ratio: 11 — ABNORMAL LOW (ref 12–28)
BUN: 10 mg/dL (ref 8–27)
Bilirubin Total: 0.4 mg/dL (ref 0.0–1.2)
CO2: 26 mmol/L (ref 20–29)
Calcium: 9.4 mg/dL (ref 8.7–10.3)
Chloride: 99 mmol/L (ref 96–106)
Creatinine, Ser: 0.92 mg/dL (ref 0.57–1.00)
GFR calc Af Amer: 71 mL/min/{1.73_m2} (ref >59)
GFR calc non Af Amer: 62 mL/min/{1.73_m2} (ref >59)
Globulin, Total: 2.4 g/dL (ref 1.5–4.5)
Glucose: 86 mg/dL (ref 65–99)
Potassium: 4.1 mmol/L (ref 3.5–5.2)
Sodium: 137 mmol/L (ref 134–144)
Total Protein: 6.9 g/dL (ref 6.0–8.5)

## 2020-11-14 LAB — LIPID PANEL W/O CHOL/HDL RATIO
Cholesterol, Total: 229 mg/dL — ABNORMAL HIGH (ref 100–199)
HDL: 69 mg/dL (ref >39)
LDL Chol Calc (NIH): 146 mg/dL — ABNORMAL HIGH (ref 0–99)
Triglycerides: 80 mg/dL (ref 0–149)
VLDL Cholesterol Cal: 14 mg/dL (ref 5–40)

## 2020-11-14 LAB — CBC WITH DIFFERENTIAL/PLATELET
Basophils Absolute: 0 10*3/uL (ref 0.0–0.2)
Basos: 1 %
EOS (ABSOLUTE): 0.1 10*3/uL (ref 0.0–0.4)
Eos: 3 %
Hematocrit: 39 % (ref 34.0–46.6)
Hemoglobin: 12.6 g/dL (ref 11.1–15.9)
Immature Grans (Abs): 0 10*3/uL (ref 0.0–0.1)
Immature Granulocytes: 0 %
Lymphocytes Absolute: 1 10*3/uL (ref 0.7–3.1)
Lymphs: 25 %
MCH: 30.7 pg (ref 26.6–33.0)
MCHC: 32.3 g/dL (ref 31.5–35.7)
MCV: 95 fL (ref 79–97)
Monocytes Absolute: 0.3 10*3/uL (ref 0.1–0.9)
Monocytes: 7 %
Neutrophils Absolute: 2.6 10*3/uL (ref 1.4–7.0)
Neutrophils: 64 %
Platelets: 159 10*3/uL (ref 150–450)
RBC: 4.1 x10E6/uL (ref 3.77–5.28)
RDW: 11.8 % (ref 11.7–15.4)
WBC: 4 10*3/uL (ref 3.4–10.8)

## 2020-11-14 LAB — TSH: TSH: 4.65 u[IU]/mL — ABNORMAL HIGH (ref 0.450–4.500)

## 2020-11-14 LAB — VITAMIN D 25 HYDROXY (VIT D DEFICIENCY, FRACTURES): Vit D, 25-Hydroxy: 26.6 ng/mL — ABNORMAL LOW (ref 30.0–100.0)

## 2020-11-14 NOTE — Progress Notes (Signed)
Contacted via MyChart The 10-year ASCVD risk score Mikey Bussing DC Jr., et al., 2013) is: 15.5%   Values used to calculate the score:     Age: 74 years     Sex: Female     Is Non-Hispanic African American: No     Diabetic: No     Tobacco smoker: No     Systolic Blood Pressure: 650 mmHg     Is BP treated: No     HDL Cholesterol: 69 mg/dL     Total Cholesterol: 229 mg/dL   Good morning Shelly Bridges, your labs have returned: - CBC, kidney function, liver function, and electrolytes are normal - Thyroid testing. TSH, is mildly elevated this check.  I would like to recheck this next visit along with a Free T4 to ensure you are not starting to show some hypothyroidism -- if TSH continues to trend up we may need to start medication. - Cholesterol levels continue to be elevated. I do recommend statin medication for his to lower your stroke risk, if you would like to trial a medication please let me know.  - Vitamin D level is improving, but still on low side, please start taking Vitamin D3 2000 units daily, which you can obtain in vitamin section at many locations. Any questions? Keep being awesome!!  Thank you for allowing me to participate in your care. Kindest regards, [name]

## 2020-11-16 ENCOUNTER — Other Ambulatory Visit: Payer: Self-pay | Admitting: Nurse Practitioner

## 2020-11-16 MED ORDER — ROSUVASTATIN CALCIUM 10 MG PO TABS: 10.0000 mg | ORAL_TABLET | Freq: Every day | ORAL | 4 refills | Status: DC

## 2020-11-19 ENCOUNTER — Other Ambulatory Visit: Payer: Self-pay | Admitting: Nurse Practitioner

## 2020-11-19 MED ORDER — ALBUTEROL SULFATE HFA 108 (90 BASE) MCG/ACT IN AERS: INHALATION_SPRAY | RESPIRATORY_TRACT | 3 refills | Status: DC

## 2020-11-21 LAB — COLOGUARD: Cologuard: NEGATIVE

## 2020-11-26 LAB — COLOGUARD: COLOGUARD: NEGATIVE

## 2021-03-22 ENCOUNTER — Other Ambulatory Visit: Payer: Self-pay | Admitting: Nurse Practitioner

## 2021-03-22 MED ORDER — BUDESONIDE-FORMOTEROL FUMARATE 160-4.5 MCG/ACT IN AERO: 2.0000 | INHALATION_SPRAY | Freq: Two times a day (BID) | RESPIRATORY_TRACT | 3 refills | Status: DC

## 2021-05-09 ENCOUNTER — Encounter: Payer: Self-pay | Admitting: Nurse Practitioner

## 2021-05-09 DIAGNOSIS — R7989 Other specified abnormal findings of blood chemistry: Secondary | ICD-10-CM | POA: Insufficient documentation

## 2021-05-14 ENCOUNTER — Ambulatory Visit: Payer: Medicare PPO | Admitting: Nurse Practitioner

## 2021-11-08 ENCOUNTER — Ambulatory Visit (INDEPENDENT_AMBULATORY_CARE_PROVIDER_SITE_OTHER): Payer: Medicare PPO | Admitting: *Deleted

## 2021-11-08 DIAGNOSIS — Z0000 Encounter for general adult medical examination without abnormal findings: Secondary | ICD-10-CM | POA: Diagnosis not present

## 2021-11-08 NOTE — Progress Notes (Signed)
Subjective:   Berenis Corter is a 75 y.o. female who presents for Medicare Annual (Subsequent) preventive examination.  I connected with  Lenox Ponds on 11/08/21 by a telephone enabled telemedicine application and verified that I am speaking with the correct person using two identifiers.   I discussed the limitations of evaluation and management by telemedicine. The patient expressed understanding and agreed to proceed.  Patient location: home  Provider location:  Tele-Health  not in office    Review of Systems     Cardiac Risk Factors include: advanced age (>30men, >26 women)     Objective:    Today's Vitals   There is no height or weight on file to calculate BMI.  Advanced Directives 11/08/2021 11/06/2020 10/28/2019 10/25/2018 10/11/2017 09/30/2016  Does Patient Have a Medical Advance Directive? No No No Yes Yes No  Type of Advance Directive - - Public librarian;Living will - -  Does patient want to make changes to medical advance directive? - - - Yes (MAU/Ambulatory/Procedural Areas - Information given) Yes (MAU/Ambulatory/Procedural Areas - Information given) -  Copy of [place] in Chart? - - - No - copy requested - -  Would patient like information on creating a medical advance directive? No - Patient declined - - - - -    Current Medications (verified) Outpatient Encounter Medications as of 11/08/2021  Medication Sig   albuterol (VENTOLIN HFA) 108 (90 Base) MCG/ACT inhaler INHALE 2 PUFFS INTO THE LUNGS EVERY 4 HOURS AS NEEDED FOR WHEEZING OR SHORTNESS OF BREATH   budesonide-formoterol (SYMBICORT) 160-4.5 MCG/ACT inhaler Inhale 2 puffs into the lungs 2 (two) times daily.   busPIRone (BUSPAR) 5 MG tablet Take 1 tablet (5 mg total) by mouth 2 (two) times daily as needed.   rosuvastatin (CRESTOR) 10 MG tablet Take 1 tablet (10 mg total) by mouth daily.   sertraline (ZOLOFT) 100 MG tablet Take 1 tablet (100 mg total) by  mouth daily.   Vitamin D, Ergocalciferol, 50 MCG (2000 UT) CAPS Take 2,000 Units by mouth. 1 x day   augmented betamethasone dipropionate (DIPROLENE AF) 0.05 % cream Apply topically 2 (two) times daily. (Patient not taking: Reported on 11/08/2021)   mupirocin ointment (BACTROBAN) 2 % Place 1 application into the nose 2 (two) times daily. (Patient not taking: Reported on 11/08/2021)   triamcinolone cream (KENALOG) 0.1 % Apply 1 application topically 2 (two) times daily. (Patient not taking: Reported on 11/08/2021)   No facility-administered encounter medications on file as of 11/08/2021.    Allergies (verified) Patient has no known allergies.   History: Past Medical History:  Diagnosis Date   Anxiety    Asthma    Lipoma    Neutropenia ([place])    Osteoporosis    Vitamin D deficiency disease    History reviewed. No pertinent surgical history. Family History  Problem Relation Age of Onset   Dementia Mother    Hypertension Mother    Osteoporosis Mother    Stroke Mother        mini stroke   Cancer Father        brain tumor and lung   Hypertension Father    Emphysema Father    COPD Neg Hx    Heart disease Neg Hx    Social History   Socioeconomic History   Marital status: Divorced    Spouse name: Not on file   Number of children: 3   Years of education: Not on file  Highest education level: Not on file  Occupational History   Not on file  Tobacco Use   Smoking status: Former    Packs/day: 1.00    Years: 20.00    Pack years: 20.00    Types: Cigarettes    Quit date: 06/17/1985    Years since quitting: 36.4   Smokeless tobacco: Never  Vaping Use   Vaping Use: Never used  Substance and Sexual Activity   Alcohol use: No   Drug use: No   Sexual activity: Not Currently  Other Topics Concern   Not on file  Social History Narrative   Not on file   Social Determinants of Health   Financial Resource Strain: Low Risk    Difficulty of Paying Living Expenses: Not hard at all   Food Insecurity: No Food Insecurity   Worried About Charity fundraiser in the Last Year: Never true   Ran Out of Food in the Last Year: Never true  Transportation Needs: No Transportation Needs   Lack of Transportation (Medical): No   Lack of Transportation (Non-Medical): No  Physical Activity: Sufficiently Active   Days of Exercise per Week: 4 days   Minutes of Exercise per Session: 40 min  Stress: No Stress Concern Present   Feeling of Stress : Not at all  Social Connections: Socially Isolated   Frequency of Communication with Friends and Family: More than three times a week   Frequency of Social Gatherings with Friends and Family: More than three times a week   Attends Religious Services: Never   Marine scientist or Organizations: No   Attends Archivist Meetings: Never   Marital Status: Widowed    Tobacco Counseling Counseling given: Not Answered   Clinical Intake:                 Diabetic?  no         Activities of Daily Living In your present state of health, do you have any difficulty performing the following activities: 11/08/2021 11/13/2020  Hearing? N N  Vision? N Y  Difficulty concentrating or making decisions? N N  Walking or climbing stairs? N N  Dressing or bathing? N N  Doing errands, shopping? N N  Preparing Food and eating ? N -  Using the Toilet? N -  In the past six months, have you accidently leaked urine? N -  Do you have problems with loss of bowel control? N -  Managing your Medications? N -  Managing your Finances? N -  Housekeeping or managing your Housekeeping? N -  Some recent data might be hidden    Patient Care Team: Venita Lick, NP as PCP - General (Nurse Practitioner)  Indicate any recent Medical Services you may have received from other than [facility] providers in the past year (date may be approximate).     Assessment:   This is a routine wellness examination for Abigaile.  Hearing/Vision  screen Hearing Screening - Comments:: No trouble hearing Vision Screening - Comments:: Dr. Ellin Mayhew Up to date  Dietary issues and exercise activities discussed: Current Exercise Habits: Home exercise routine, Type of exercise: walking;stretching;strength training/weights, Time (Minutes): 40, Frequency (Times/Week): 4, Weekly Exercise (Minutes/Week): 160, Intensity: Moderate, Exercise limited by: None identified   Goals Addressed             This Visit's Progress    Patient Stated       Continue current lifestyle       Depression  Screen PHQ 2/9 Scores 11/08/2021 11/13/2020 11/06/2020 11/12/2019 10/28/2019 04/30/2019 10/25/2018  PHQ - 2 Score 0 0 0 2 0 2 2  PHQ- 9 Score - - - 3 - 2 5    Fall Risk Fall Risk  11/08/2021 11/13/2020 11/06/2020 10/28/2019 10/25/2018  Falls in the past year? 0 0 0 0 0  Number falls in past yr: 0 - - 0 0  Injury with Fall? 0 - - 0 0  Follow up Falls evaluation completed;Falls prevention discussed Falls evaluation completed Falls evaluation completed;Education provided;Falls prevention discussed - -    FALL RISK PREVENTION PERTAINING TO THE HOME:  Any stairs in or around the home? No  If so, are there any without handrails? No  Home free of loose throw rugs in walkways, pet beds, electrical cords, etc? Yes  Adequate lighting in your home to reduce risk of falls? Yes   ASSISTIVE DEVICES UTILIZED TO PREVENT FALLS:  Life alert? No  Use of a cane, walker or w/c? No  Grab bars in the bathroom? No  Shower chair or bench in shower? Yes  Elevated toilet seat or a handicapped toilet? No   TIMED UP AND GO:  Was the test performed? No .    Cognitive Function:   Normal cognitive status assessed by direct observation by this Nurse Health Advisor. No abnormalities found.     6CIT Screen 11/06/2020 10/25/2018 10/11/2017  What Year? 0 points 0 points 0 points  What month? 0 points 0 points 0 points  What time? 0 points 0 points 0 points  Count back from  20 0 points 0 points 0 points  Months in reverse 2 points 0 points 0 points  Repeat phrase 2 points 0 points 2 points  Total Score 4 0 2    Immunizations Immunization History  Administered Date(s) Administered   Fluad Quad(high Dose 65+) 09/19/2019, 08/05/2021   Influenza, High Dose Seasonal PF 10/25/2016, 10/11/2017, 10/05/2018   Influenza,inj,Quad PF,6+ Mos 10/26/2015   Influenza-Unspecified 10/08/2020   Moderna Sars-Covid-2 Vaccination 03/14/2020, 04/11/2020, 10/08/2020   Pneumococcal Conjugate-13 07/03/2014   Pneumococcal Polysaccharide-23 10/26/2015   Td 12/05/2008   Tdap 06/04/2018   Zoster Recombinat (Shingrix) 01/28/2019, 07/02/2019    TDAP status: Up to date  Flu Vaccine status: Up to date  Pneumococcal vaccine status: Up to date  Covid-19 vaccine status: Information provided on how to obtain vaccines.   Qualifies for Shingles Vaccine? No   Zostavax completed No   Shingrix Completed?: Yes  Screening Tests Health Maintenance  Topic Date Due   COVID-19 Vaccine (4 - Booster for Moderna series) 12/03/2020   Fecal DNA (Cologuard)  11/22/2023   TETANUS/TDAP  06/04/2028   Pneumonia Vaccine 15+ Years old  Completed   INFLUENZA VACCINE  Completed   Hepatitis C Screening  Completed   Zoster Vaccines- Shingrix  Completed   DEXA SCAN  Addressed   HPV VACCINES  Aged Out    Health Maintenance  Health Maintenance Due  Topic Date Due   COVID-19 Vaccine (4 - Booster for Moderna series) 12/03/2020    Colorectal cancer screening: Type of screening: Cologuard. Completed .2021. Repeat every 3 years  Mammogram status: Completed 2022. Repeat every year  Bone Density status: Ordered  . Pt provided with contact info and advised to call to schedule appt.  Lung Cancer Screening: (Low Dose CT Chest recommended if Age 63-80 years, 30 pack-year currently smoking OR have quit w/in 15years.) does not qualify.   Lung Cancer Screening Referral:  Additional  Screening:  Hepatitis C Screening: does not qualify; Completed 2018  Vision Screening: Recommended annual ophthalmology exams for early detection of glaucoma and other disorders of the eye. Is the patient up to date with their annual eye exam?  Yes  Who is the provider or what is the name of the office in which the patient attends annual eye exams? Dr. Ellin Mayhew If pt is not established with a provider, would they like to be referred to a provider to establish care? No .   Dental Screening: Recommended annual dental exams for proper oral hygiene  Community Resource Referral / Chronic Care Management: CRR required this visit?  No   CCM required this visit?  No      Plan:     I have personally reviewed and noted the following in the patient's chart:   Medical and social history Use of alcohol, tobacco or illicit drugs  Current medications and supplements including opioid prescriptions.  Functional ability and status Nutritional status Physical activity Advanced directives List of other physicians Hospitalizations, surgeries, and ER visits in previous 12 months Vitals Screenings to include cognitive, depression, and falls Referrals and appointments  In addition, I have reviewed and discussed with patient certain preventive protocols, quality metrics, and best practice recommendations. A written personalized care plan for preventive services as well as general preventive health recommendations were provided to patient.     Leroy Kennedy, LPN   03/09/9976   Nurse Notes:

## 2021-11-08 NOTE — Patient Instructions (Signed)
Shelly Bridges , Thank you for taking time to come for your Medicare Wellness Visit. I appreciate your ongoing commitment to your health goals. Please review the following plan we discussed and let me know if I can assist you in the future.   Screening recommendations/referrals: Colonoscopy: up to date Mammogram: up to date Bone Density: Education provided Recommended yearly ophthalmology/optometry visit for glaucoma screening and checkup Recommended yearly dental visit for hygiene and checkup  Vaccinations: Influenza vaccine: up to date Pneumococcal vaccine: up to date Tdap vaccine: up to date Shingles vaccine: up to date    Advanced directives: Education provided  Conditions/risks identified:   Next appointment:    Preventive Care 46 Years and Older, Female Preventive care refers to lifestyle choices and visits with your health care provider that can promote health and wellness. What does preventive care include? A yearly physical exam. This is also called an annual well check. Dental exams once or twice a year. Routine eye exams. Ask your health care provider how often you should have your eyes checked. Personal lifestyle choices, including: Daily care of your teeth and gums. Regular physical activity. Eating a healthy diet. Avoiding tobacco and drug use. Limiting alcohol use. Practicing safe sex. Taking low-dose aspirin every day. Taking vitamin and mineral supplements as recommended by your health care provider. What happens during an annual well check? The services and screenings done by your health care provider during your annual well check will depend on your age, overall health, lifestyle risk factors, and family history of disease. Counseling  Your health care provider may ask you questions about your: Alcohol use. Tobacco use. Drug use. Emotional well-being. Home and relationship well-being. Sexual activity. Eating habits. History of falls. Memory and  ability to understand (cognition). Work and work Statistician. Reproductive health. Screening  You may have the following tests or measurements: Height, weight, and BMI. Blood pressure. Lipid and cholesterol levels. These may be checked every 5 years, or more frequently if you are over 13 years old. Skin check. Lung cancer screening. You may have this screening every year starting at age 12 if you have a 30-pack-year history of smoking and currently smoke or have quit within the past 15 years. Fecal occult blood test (FOBT) of the stool. You may have this test every year starting at age 21. Flexible sigmoidoscopy or colonoscopy. You may have a sigmoidoscopy every 5 years or a colonoscopy every 10 years starting at age 50. Hepatitis C blood test. Hepatitis B blood test. Sexually transmitted disease (STD) testing. Diabetes screening. This is done by checking your blood sugar (glucose) after you have not eaten for a while (fasting). You may have this done every 1-3 years. Bone density scan. This is done to screen for osteoporosis. You may have this done starting at age 68. Mammogram. This may be done every 1-2 years. Talk to your health care provider about how often you should have regular mammograms. Talk with your health care provider about your test results, treatment options, and if necessary, the need for more tests. Vaccines  Your health care provider may recommend certain vaccines, such as: Influenza vaccine. This is recommended every year. Tetanus, diphtheria, and acellular pertussis (Tdap, Td) vaccine. You may need a Td booster every 10 years. Zoster vaccine. You may need this after age 37. Pneumococcal 13-valent conjugate (PCV13) vaccine. One dose is recommended after age 3. Pneumococcal polysaccharide (PPSV23) vaccine. One dose is recommended after age 38. Talk to your health care provider about which screenings  and vaccines you need and how often you need them. This information is  not intended to replace advice given to you by your health care provider. Make sure you discuss any questions you have with your health care provider. Document Released: 12/18/2015 Document Revised: 08/10/2016 Document Reviewed: 09/22/2015 Elsevier Interactive Patient Education  2017 [place] Prevention in the Home Falls can cause injuries. They can happen to people of all ages. There are many things you can do to make your home safe and to help prevent falls. What can I do on the outside of my home? Regularly fix the edges of walkways and driveways and fix any cracks. Remove anything that might make you trip as you walk through a door, such as a raised step or threshold. Trim any bushes or trees on the path to your home. Use bright outdoor lighting. Clear any walking paths of anything that might make someone trip, such as rocks or tools. Regularly check to see if handrails are loose or broken. Make sure that both sides of any steps have handrails. Any raised decks and porches should have guardrails on the edges. Have any leaves, snow, or ice cleared regularly. Use sand or salt on walking paths during winter. Clean up any spills in your garage right away. This includes oil or grease spills. What can I do in the bathroom? Use night lights. Install grab bars by the toilet and in the tub and shower. Do not use towel bars as grab bars. Use non-skid mats or decals in the tub or shower. If you need to sit down in the shower, use a plastic, non-slip stool. Keep the floor dry. Clean up any water that spills on the floor as soon as it happens. Remove soap buildup in the tub or shower regularly. Attach bath mats securely with double-sided non-slip rug tape. Do not have throw rugs and other things on the floor that can make you trip. What can I do in the bedroom? Use night lights. Make sure that you have a light by your bed that is easy to reach. Do not use any sheets or blankets that  are too big for your bed. They should not hang down onto the floor. Have a firm chair that has side arms. You can use this for support while you get dressed. Do not have throw rugs and other things on the floor that can make you trip. What can I do in the kitchen? Clean up any spills right away. Avoid walking on wet floors. Keep items that you use a lot in easy-to-reach places. If you need to reach something above you, use a strong step stool that has a grab bar. Keep electrical cords out of the way. Do not use floor polish or wax that makes floors slippery. If you must use wax, use non-skid floor wax. Do not have throw rugs and other things on the floor that can make you trip. What can I do with my stairs? Do not leave any items on the stairs. Make sure that there are handrails on both sides of the stairs and use them. Fix handrails that are broken or loose. Make sure that handrails are as long as the stairways. Check any carpeting to make sure that it is firmly attached to the stairs. Fix any carpet that is loose or worn. Avoid having throw rugs at the top or bottom of the stairs. If you do have throw rugs, attach them to the floor with carpet tape.  Make sure that you have a light switch at the top of the stairs and the bottom of the stairs. If you do not have them, ask someone to add them for you. What else can I do to help prevent falls? Wear shoes that: Do not have high heels. Have rubber bottoms. Are comfortable and fit you well. Are closed at the toe. Do not wear sandals. If you use a stepladder: Make sure that it is fully opened. Do not climb a closed stepladder. Make sure that both sides of the stepladder are locked into place. Ask someone to hold it for you, if possible. Clearly mark and make sure that you can see: Any grab bars or handrails. First and last steps. Where the edge of each step is. Use tools that help you move around (mobility aids) if they are needed. These  include: Canes. Walkers. Scooters. Crutches. Turn on the lights when you go into a dark area. Replace any light bulbs as soon as they burn out. Set up your furniture so you have a clear path. Avoid moving your furniture around. If any of your floors are uneven, fix them. If there are any pets around you, be aware of where they are. Review your medicines with your doctor. Some medicines can make you feel dizzy. This can increase your chance of falling. Ask your doctor what other things that you can do to help prevent falls. This information is not intended to replace advice given to you by your health care provider. Make sure you discuss any questions you have with your health care provider. Document Released: 09/17/2009 Document Revised: 04/28/2016 Document Reviewed: 12/26/2014 Elsevier Interactive Patient Education  2017 [employer].

## 2021-11-11 ENCOUNTER — Encounter: Payer: Self-pay | Admitting: Nurse Practitioner

## 2021-11-11 DIAGNOSIS — M85852 Other specified disorders of bone density and structure, left thigh: Secondary | ICD-10-CM

## 2021-11-13 ENCOUNTER — Encounter: Payer: Self-pay | Admitting: Nurse Practitioner

## 2021-11-15 ENCOUNTER — Ambulatory Visit: Payer: Medicare PPO | Admitting: Family Medicine

## 2021-11-15 ENCOUNTER — Ambulatory Visit: Payer: Self-pay

## 2021-11-15 ENCOUNTER — Encounter: Payer: Self-pay | Admitting: Family Medicine

## 2021-11-15 ENCOUNTER — Other Ambulatory Visit: Payer: Self-pay

## 2021-11-15 VITALS — BP 147/70 | HR 69 | Temp 98.7°F | Wt 134.4 lb

## 2021-11-15 DIAGNOSIS — R051 Acute cough: Secondary | ICD-10-CM | POA: Diagnosis not present

## 2021-11-15 DIAGNOSIS — J441 Chronic obstructive pulmonary disease with (acute) exacerbation: Secondary | ICD-10-CM | POA: Diagnosis not present

## 2021-11-15 LAB — VERITOR FLU A/B WAIVED
Influenza A: NEGATIVE
Influenza B: NEGATIVE

## 2021-11-15 MED ORDER — PREDNISONE 10 MG PO TABS: ORAL_TABLET | ORAL | 0 refills | Status: DC

## 2021-11-15 MED ORDER — HYDROCOD POLST-CPM POLST ER 10-8 MG/5ML PO SUER: 5.0000 mL | Freq: Two times a day (BID) | ORAL | 0 refills | Status: DC | PRN

## 2021-11-15 MED ORDER — TRIAMCINOLONE ACETONIDE 40 MG/ML IJ SUSP: 40.0000 mg | Freq: Once | INTRAMUSCULAR | Status: DC

## 2021-11-15 NOTE — Progress Notes (Signed)
BP (!) 147/70   Pulse 69   Temp 98.7 F (37.1 C)   Wt 134 lb 6.4 oz (61 kg)   SpO2 97%   BMI 26.99 kg/m    Subjective:    Patient ID: Lenox Ponds, female    DOB: 05/24/1946, 75 y.o.   MRN: [id number]  HPI: Mataya Kilduff is a 75 y.o. female  Chief Complaint  Patient presents with   Cough    Patient states she started coughing since Friday, patient is also wheezing and feels tightness in her chest    UPPER RESPIRATORY TRACT INFECTION Duration: 4 days Worst symptom: cough Fever: no Cough: yes Shortness of breath: yes Wheezing: yes Chest pain: no Chest tightness: yes Chest congestion: no Nasal congestion: no Runny nose: no Post nasal drip: no Sneezing: no Sore throat: no Swollen glands: no Sinus pressure: no Headache: no Face pain: no Toothache: no Ear pain: no  Ear pressure: no  Eyes red/itching:yes Eye drainage/crusting: no  Vomiting: no Rash: no Fatigue: yes Sick contacts: no Strep contacts: no  Context: stable Recurrent sinusitis: no Relief with OTC cold/cough medications: no  Treatments attempted: nebulizer   Relevant past medical, surgical, family and social history reviewed and updated as indicated. Interim medical history since our last visit reviewed. Allergies and medications reviewed and updated.  Review of Systems  Constitutional: Negative.   HENT: Negative.    Eyes: Negative.   Respiratory:  Positive for cough, shortness of breath and wheezing. Negative for apnea, choking, chest tightness and stridor.   Cardiovascular: Negative.   Gastrointestinal: Negative.   Musculoskeletal: Negative.   Psychiatric/Behavioral: Negative.     Per HPI unless specifically indicated above     Objective:    BP (!) 147/70   Pulse 69   Temp 98.7 F (37.1 C)   Wt 134 lb 6.4 oz (61 kg)   SpO2 97%   BMI 26.99 kg/m   Wt Readings from Last 3 Encounters:  11/15/21 134 lb 6.4 oz (61 kg)  11/13/20 140 lb (63.5 kg)  11/06/20 141 lb  (64 kg)    Physical Exam Vitals and nursing note reviewed.  Constitutional:      General: She is not in acute distress.    Appearance: Normal appearance. She is not ill-appearing, toxic-appearing or diaphoretic.  HENT:     Head: Normocephalic and atraumatic.     Right Ear: External ear normal.     Left Ear: External ear normal.     Nose: Nose normal.     Mouth/Throat:     Mouth: Mucous membranes are moist.     Pharynx: Oropharynx is clear.  Eyes:     General: No scleral icterus.       Right eye: No discharge.        Left eye: No discharge.     Extraocular Movements: Extraocular movements intact.     Conjunctiva/sclera: Conjunctivae normal.     Pupils: Pupils are equal, round, and reactive to light.  Cardiovascular:     Rate and Rhythm: Normal rate and regular rhythm.     Pulses: Normal pulses.     Heart sounds: Normal heart sounds. No murmur heard.   No friction rub. No gallop.  Pulmonary:     Effort: Pulmonary effort is normal. No respiratory distress.     Breath sounds: No stridor. Wheezing present. No rhonchi or rales.     Comments: Very tight breath sounds. + wheezing, no rhonchi Chest:     Chest  wall: No tenderness.  Musculoskeletal:        General: Normal range of motion.     Cervical back: Normal range of motion and neck supple.  Skin:    General: Skin is warm and dry.     Capillary Refill: Capillary refill takes less than 2 seconds.     Coloration: Skin is not jaundiced or pale.     Findings: No bruising, erythema, lesion or rash.  Neurological:     General: No focal deficit present.     Mental Status: She is alert and oriented to person, place, and time. Mental status is at baseline.  Psychiatric:        Mood and Affect: Mood normal.        Behavior: Behavior normal.        Thought Content: Thought content normal.        Judgment: Judgment normal.    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 Call if not getting better or getting worse. Call with any concerns.    Relevant Medications   predniSONE (DELTASONE) 10 MG tablet   triamcinolone acetonide (KENALOG-40) injection 40 mg   chlorpheniramine-HYDROcodone (TUSSIONEX PENNKINETIC ER) 10-8 MG/5ML SUER   Acute cough       Flu negative.    Relevant Orders   Veritor Flu A/B Waived        Follow up plan: Return in about 2 weeks (around 11/29/2021), or with jolene.

## 2021-11-15 NOTE — Telephone Encounter (Signed)
  Chief Complaint: Cough with wheezing Symptoms: Cough, wheezing, chest tight, shortness of breath Frequency: Started last week Pertinent Negatives: Patient denies Fever Disposition: [] ED /[] Urgent Care (no appt availability in office) / [x] Appointment(In office/virtual)/ []  [facility]/ [] Home Care/ [] Refused Recommended Disposition  Additional Notes: History of asthma.    Reason for Disposition  [1] Continuous (nonstop) coughing interferes with work or school AND [2] no improvement using cough treatment per Care Advice  Answer Assessment - Initial Assessment Questions 1. ONSET: "When did the cough begin?"      Last week 2. SEVERITY: "How bad is the cough today?"      Severe 3. SPUTUM: "Describe the color of your sputum" (none, dry cough; clear, white, yellow, green)     Clear 4. HEMOPTYSIS: "Are you coughing up any blood?" If so ask: "How much?" (flecks, streaks, tablespoons, etc.)     No 5. DIFFICULTY BREATHING: "Are you having difficulty breathing?" If Yes, ask: "How bad is it?" (e.g., mild, moderate, severe)    - MILD: No SOB at rest, mild SOB with walking, speaks normally in sentences, can lie down, no retractions, pulse < 100.    - MODERATE: SOB at rest, SOB with minimal exertion and prefers to sit, cannot lie down flat, speaks in phrases, mild retractions, audible wheezing, pulse 100-120.    - SEVERE: Very SOB at rest, speaks in single words, struggling to breathe, sitting hunched forward, retractions, pulse > 120      Mild 6. FEVER: "Do you have a fever?" If Yes, ask: "What is your temperature, how was it measured, and when did it start?"     No 7. CARDIAC HISTORY: "Do you have any history of heart disease?" (e.g., heart attack, congestive heart failure)      No 8. LUNG HISTORY: "Do you have any history of lung disease?"  (e.g., pulmonary embolus, asthma, emphysema)     Asthma 9. PE RISK FACTORS: "Do you have a history of blood clots?" (or: recent major surgery,  recent prolonged travel, bedridden)     No 10. OTHER SYMPTOMS: "Do you have any other symptoms?" (e.g., runny nose, wheezing, chest pain)       Wheezing 11. PREGNANCY: "Is there any chance you are pregnant?" "When was your last menstrual period?"       No 12. TRAVEL: "Have you traveled out of the country in the last month?" (e.g., travel history, exposures)       No  Protocols used: Cough - Acute Productive-A-AH

## 2021-11-18 ENCOUNTER — Encounter: Payer: Self-pay | Admitting: Family Medicine

## 2021-11-19 ENCOUNTER — Ambulatory Visit: Payer: Self-pay | Admitting: *Deleted

## 2021-11-19 NOTE — Telephone Encounter (Signed)
Reason for Disposition  Cough  Answer Assessment - Initial Assessment Questions 1. ONSET: "When did the cough begin?"      Several days,  2. SEVERITY: "How bad is the cough today?"      bad 3. SPUTUM: "Describe the color of your sputum" (none, dry cough; clear, white, yellow, green)     no 4. HEMOPTYSIS: "Are you coughing up any blood?" If so ask: "How much?" (flecks, streaks, tablespoons, etc.)     no 5. DIFFICULTY BREATHING: "Are you having difficulty breathing?" If Yes, ask: "How bad is it?" (e.g., mild, moderate, severe)    - MILD: No SOB at rest, mild SOB with walking, speaks normally in sentences, can lie down, no retractions, pulse < 100.    - MODERATE: SOB at rest, SOB with minimal exertion and prefers to sit, cannot lie down flat, speaks in phrases, mild retractions, audible wheezing, pulse 100-120.    - SEVERE: Very SOB at rest, speaks in single words, struggling to breathe, sitting hunched forward, retractions, pulse > 120      no 6. FEVER: "Do you have a fever?" If Yes, ask: "What is your temperature, how was it measured, and when did it start?"     no 7. CARDIAC HISTORY: "Do you have any history of heart disease?" (e.g., heart attack, congestive heart failure)      no 8. LUNG HISTORY: "Do you have any history of lung disease?"  (e.g., pulmonary embolus, asthma, emphysema)     asthma  10. OTHER SYMPTOMS: "Do you have any other symptoms?" (e.g., runny nose, wheezing, chest pain)       no 11. PREGNANCY: "Is there any chance you are pregnant?" "When was your last menstrual period?"       no 12. TRAVEL: "Have you traveled out of the country in the last month?" (e.g., travel history, exposures)       no  Protocols used: Cough - Acute Non-Productive-A-AH Finished round of Tussinex and prednisone which improved for a while, everything better except non stop cough. The previous symptoms have not returned, wheezing, SOB, Chest heaviness all negative, just dry cough that is very  persistent.

## 2021-11-21 ENCOUNTER — Other Ambulatory Visit: Payer: Self-pay | Admitting: Nurse Practitioner

## 2021-11-21 MED ORDER — AZITHROMYCIN 250 MG PO TABS: ORAL_TABLET | ORAL | 0 refills | Status: AC

## 2021-11-22 NOTE — Telephone Encounter (Signed)
Spoke with patient and was informed that she received her prescription last night and she has taken two tablets so far. Patient was advised to give our office a call back for an appointment if she noticed worsening of her symptoms after course of treatment is completed. Verbalized understanding.

## 2021-11-22 NOTE — Telephone Encounter (Signed)
Please see if she is still feeling sick

## 2021-11-23 ENCOUNTER — Encounter: Payer: Self-pay | Admitting: Nurse Practitioner

## 2021-11-23 NOTE — Telephone Encounter (Signed)
Copied from [place] [id number]. Topic: General - Other >> Nov 23, 2021  1:18 PM Pawlus, Brayton Layman A wrote: Reason for CRM: Pt stated she was informed to call back in if her cough has not gotten any better, pt wanted to know if there was something else Shelly Bridges could send in, pt also sent MyChart messages, please advise.

## 2021-11-26 ENCOUNTER — Telehealth: Payer: Self-pay | Admitting: Nurse Practitioner

## 2021-11-26 MED ORDER — PREDNISONE 20 MG PO TABS: 40.0000 mg | ORAL_TABLET | Freq: Every day | ORAL | 0 refills | Status: AC

## 2021-11-26 MED ORDER — BENZONATATE 100 MG PO CAPS: 100.0000 mg | ORAL_CAPSULE | Freq: Three times a day (TID) | ORAL | 0 refills | Status: DC | PRN

## 2021-11-26 NOTE — Telephone Encounter (Signed)
Copied from [place] [phone]. Topic: General - Other >> Nov 25, 2021  4:44 PM Yvette Rack wrote: Reason for CRM: Pt reports that she completed the antibiotics and she is breathing better but she still has the cough. Pt declined to schedule an appt and asked that a message be sent to make Jolene aware she still has the cough. Cb# [phone]

## 2021-12-20 ENCOUNTER — Ambulatory Visit
Admission: RE | Admit: 2021-12-20 | Discharge: 2021-12-20 | Disposition: A | Discharge status: Home / Self Care | Payer: Medicare PPO | Source: Ambulatory Visit | Attending: Nurse Practitioner | Admitting: Nurse Practitioner

## 2021-12-20 ENCOUNTER — Other Ambulatory Visit: Payer: Self-pay

## 2021-12-20 DIAGNOSIS — M85852 Other specified disorders of bone density and structure, left thigh: Secondary | ICD-10-CM | POA: Diagnosis not present

## 2021-12-20 NOTE — Progress Notes (Signed)
Contacted via MyChart   Your bone density shows thinning bones (osteopenia) but not brittle (osteoporosis). We recommend Vitamin D supplementation of about 2,0000 IUs of over the counter Vitamin D3. In addition, we recommend a diet high in calcium with dairy and dark green leafy vegetables. We would like you to get plenty of weight bearing exercises with walking and resistance training such as light weights or resistance bands available with instructions at places such as [employer].

## 2021-12-21 ENCOUNTER — Ambulatory Visit: Payer: Medicare PPO | Admitting: Nurse Practitioner

## 2021-12-21 ENCOUNTER — Other Ambulatory Visit: Payer: Self-pay

## 2021-12-21 ENCOUNTER — Encounter: Payer: Self-pay | Admitting: Nurse Practitioner

## 2021-12-21 VITALS — BP 128/68 | HR 62 | Temp 98.2°F | Wt 136.2 lb

## 2021-12-21 DIAGNOSIS — I10 Essential (primary) hypertension: Secondary | ICD-10-CM

## 2021-12-21 DIAGNOSIS — J410 Simple chronic bronchitis: Secondary | ICD-10-CM

## 2021-12-21 DIAGNOSIS — E559 Vitamin D deficiency, unspecified: Secondary | ICD-10-CM

## 2021-12-21 DIAGNOSIS — E7801 Familial hypercholesterolemia: Secondary | ICD-10-CM

## 2021-12-21 DIAGNOSIS — M85852 Other specified disorders of bone density and structure, left thigh: Secondary | ICD-10-CM

## 2021-12-21 DIAGNOSIS — F339 Major depressive disorder, recurrent, unspecified: Secondary | ICD-10-CM | POA: Diagnosis not present

## 2021-12-21 DIAGNOSIS — B354 Tinea corporis: Secondary | ICD-10-CM | POA: Insufficient documentation

## 2021-12-21 DIAGNOSIS — F419 Anxiety disorder, unspecified: Secondary | ICD-10-CM

## 2021-12-21 DIAGNOSIS — R7989 Other specified abnormal findings of blood chemistry: Secondary | ICD-10-CM

## 2021-12-21 LAB — MICROALBUMIN, URINE WAIVED
Creatinine, Urine Waived: 50 mg/dL (ref 10–300)
Microalb, Ur Waived: 10 mg/L (ref 0–19)
Microalb/Creat Ratio: <30 mg/g (ref <30)

## 2021-12-21 MED ORDER — ROSUVASTATIN CALCIUM 10 MG PO TABS: 10.0000 mg | ORAL_TABLET | Freq: Every day | ORAL | 4 refills | Status: DC

## 2021-12-21 MED ORDER — MONTELUKAST SODIUM 10 MG PO TABS: 10.0000 mg | ORAL_TABLET | Freq: Every day | ORAL | 3 refills | Status: DC

## 2021-12-21 MED ORDER — BUSPIRONE HCL 5 MG PO TABS: 5.0000 mg | ORAL_TABLET | Freq: Two times a day (BID) | ORAL | 4 refills | Status: DC | PRN

## 2021-12-21 MED ORDER — NYSTATIN 100000 UNIT/GM EX CREA: 1.0000 "application " | TOPICAL_CREAM | Freq: Two times a day (BID) | CUTANEOUS | 5 refills | Status: DC

## 2021-12-21 MED ORDER — ALBUTEROL SULFATE HFA 108 (90 BASE) MCG/ACT IN AERS
INHALATION_SPRAY | RESPIRATORY_TRACT | 3 refills | Status: DC
Start: 2021-12-21 — End: 2022-06-20

## 2021-12-21 MED ORDER — SERTRALINE HCL 100 MG PO TABS: 100.0000 mg | ORAL_TABLET | Freq: Every day | ORAL | 4 refills | Status: DC

## 2021-12-21 MED ORDER — BUDESONIDE-FORMOTEROL FUMARATE 160-4.5 MCG/ACT IN AERO
2.0000 | INHALATION_SPRAY | Freq: Two times a day (BID) | RESPIRATORY_TRACT | 4 refills | Status: DC
Start: 2021-12-21 — End: 2022-05-13

## 2021-12-21 NOTE — Assessment & Plan Note (Signed)
Chronic, stable.  Will continue Sertraline and Buspar at this time.  Buspar safer alternative to take for increased anxiety.  Denies SI/HI.  Return to office in 6 months. 

## 2021-12-21 NOTE — Assessment & Plan Note (Signed)
No current symptoms. Recheck TSH and Free T4.  

## 2021-12-21 NOTE — Assessment & Plan Note (Signed)
Chronic, ongoing with no medications at this time.  BP at goal for age.  Will continue diet focus and if needed in future restart HCTZ which has worked well in past, or add on ARB, avoid ACE due to COPD.  Labs today to include CBC, CMP, TSH, urine ALB.  Return in 6 months.

## 2021-12-21 NOTE — Assessment & Plan Note (Signed)
Chronic, ongoing.  Check level today and continue supplement.   

## 2021-12-21 NOTE — Assessment & Plan Note (Signed)
Chronic, ongoing.  Last exacerbation 11/15/21.  Continue current medication regimen and adjust as needed.  Add on Singulair for ongoing postnasal drainage with poor response to OTC antihistamine treatment, educated her on this + BLACK BOX warning -- she wishes to trial this.  Plan for spirometry at next visit.  CBC today.  Refills sent in.

## 2021-12-21 NOTE — Assessment & Plan Note (Signed)
Refer to depression plan of care. 

## 2021-12-21 NOTE — Assessment & Plan Note (Signed)
Ongoing with flares to legs == ?tinea vs nummular.  Will send in Nystatin cream to treat and monitor, if worsening or ongoing them consider trial of steroid cream or referral to dermatology.

## 2021-12-21 NOTE — Progress Notes (Signed)
BP 128/68 (BP Location: Left Arm, Patient Position: Sitting, Cuff Size: Normal)    Pulse 62    Temp 98.2 F (36.8 C)    Wt 136 lb 3.2 oz (61.8 kg)    SpO2 98%    BMI 27.35 kg/m    Subjective:    Patient ID: Shelly Bridges, female    DOB: [birth year], 76 y.o.   MRN: [id number]  HPI: Shelly Bridges is a 76 y.o. female  Chief Complaint  Patient presents with   Bronchitis   Hypertension   Anxiety   HYPERTENSION Has been off blood pressure for > 1 year.  Rosuvastatin 10 MG daily for HLD.  Last TSH check there was mild elevation, she was to return for recheck but missed this.  Denies symptoms. Hypertension status: stable  Satisfied with current treatment? yes Duration of hypertension: chronic BP monitoring frequency: not checking BP range: not checking BP medication side effects:  no Medication compliance: good compliance Previous BP meds: HCTZ Aspirin: no Recurrent headaches: no Visual changes: no Palpitations: no Dyspnea: no Chest pain: no Lower extremity edema: no Dizzy/lightheaded: no The 10-year ASCVD risk score (Arnett DK, et al., 2019) is: 16.3%   Values used to calculate the score:     Age: 22 years     Sex: Female     Is Non-Hispanic African American: No     Diabetic: No     Tobacco smoker: No     Systolic Blood Pressure: 419 mmHg     Is BP treated: No     HDL Cholesterol: 69 mg/dL     Total Cholesterol: 229 mg/dL    COPD Continues on Symbicort daily and Albuterol as needed with good control. Past smoker, has not smoked for > 30 years. Smoked for about 20 years on and off.  Recent exacerbation on 11/15/21 -- coughing lasted for a period of time.  Continues to feel like something in throat and clearing throat often.  Has ongoing irritation to throat, but nothing comes out - no coughing.  COPD status: stable Satisfied with current treatment?: yes Oxygen use: no Dyspnea frequency: none Cough frequency: none Rescue inhaler frequency:  twice a  month Limitation of activity: no Productive cough: none Last Spirometry: 2018 Pneumovax: Up to Date Influenza: Up to Date   OSTEOPENIA Recent DEXA on 12/20/21 == The BMD measured at Femur Neck Left is 0.724 g/cm2 with a T-score of -2.3.  Satisfied with current treatment?: yes Medication compliance: good compliance Adequate calcium & vitamin D: yes Weight bearing exercises: yes   SKIN LESION They pop up from time to time.  Have been present for years. Duration: months Location: to legs Painful: no Itching: no Onset:  ongoing Context: not changing Associated signs and symptoms: none History of skin cancer: no History of precancerous skin lesions: no Family history of skin cancer: no    ANXIETY/STRESS Taking Sertraline 100 MG daily and Buspar as needed. Duration:stable Anxious mood: no Excessive worrying: no Irritability: no  Sweating: no Nausea: no Palpitations:no Hyperventilation: no Panic attacks: no Agoraphobia: no  Obscessions/compulsions: no Depressed mood: no Depression screen [facility] 2/9 12/21/2021 11/15/2021 11/08/2021 11/13/2020 11/06/2020  Decreased Interest 1 0 0 0 0  Down, Depressed, Hopeless 1 0 0 0 0  PHQ - 2 Score 2 0 0 0 0  Altered sleeping 2 1 - - -  Tired, decreased energy 1 1 - - -  Change in appetite 1 1 - - -  Feeling bad or failure  about yourself  1 1 - - -  Trouble concentrating 1 1 - - -  Moving slowly or fidgety/restless 0 0 - - -  Suicidal thoughts 0 0 - - -  PHQ-9 Score 8 5 - - -  Difficult doing work/chores - - - - -  Some recent data might be hidden  Anhedonia: no Weight changes: no Insomnia: none Hypersomnia: no Fatigue/loss of energy: no Feelings of worthlessness: no Feelings of guilt: no Impaired concentration/indecisiveness: no Suicidal ideations: no  Crying spells: no Recent Stressors/Life Changes: no   Relationship problems: no   Family stress: no     Financial stress: no    Job stress: no    Recent death/loss: no GAD 7 :  Generalized Anxiety Score 12/21/2021 11/15/2021 04/30/2019 10/26/2015  Nervous, Anxious, on Edge 1 1 2 3   Control/stop worrying 1 1 1 3   Worry too much - different things 1 1 1 3   Trouble relaxing 1 1 0 1  Restless 1 1 0 1  Easily annoyed or irritable 0 0 0 1  Afraid - awful might happen 1 0 0 1  Total GAD 7 Score 6 5 4 13   Anxiety Difficulty Not difficult at all - Not difficult at all Somewhat difficult   Relevant past medical, surgical, family and social history reviewed and updated as indicated. Interim medical history since our last visit reviewed. Allergies and medications reviewed and updated.  Review of Systems  Constitutional:  Negative for activity change, appetite change, diaphoresis, fatigue and fever.  Respiratory:  Negative for cough, chest tightness and shortness of breath.   Cardiovascular:  Negative for chest pain, palpitations and leg swelling.  Gastrointestinal: Negative.   Skin:  Positive for rash.  Neurological: Negative.   Psychiatric/Behavioral: Negative.     Per HPI unless specifically indicated above     Objective:    BP 128/68 (BP Location: Left Arm, Patient Position: Sitting, Cuff Size: Normal)    Pulse 62    Temp 98.2 F (36.8 C)    Wt 136 lb 3.2 oz (61.8 kg)    SpO2 98%    BMI 27.35 kg/m   Wt Readings from Last 3 Encounters:  12/21/21 136 lb 3.2 oz (61.8 kg)  11/15/21 134 lb 6.4 oz (61 kg)  11/13/20 140 lb (63.5 kg)    Physical Exam Vitals and nursing note reviewed.  Constitutional:      General: She is awake. She is not in acute distress.    Appearance: She is well-developed and well-groomed. She is not ill-appearing or toxic-appearing.  HENT:     Head: Normocephalic.     Right Ear: Hearing normal.     Left Ear: Hearing normal.  Eyes:     General: Lids are normal.        Right eye: No discharge.        Left eye: No discharge.     Conjunctiva/sclera: Conjunctivae normal.     Pupils: Pupils are equal, round, and reactive to light.  Neck:      Thyroid: No thyromegaly.     Vascular: No carotid bruit.  Cardiovascular:     Rate and Rhythm: Normal rate and regular rhythm.     Heart sounds: Normal heart sounds. No murmur heard.   No gallop.  Pulmonary:     Effort: Pulmonary effort is normal. No accessory muscle usage or respiratory distress.     Breath sounds: Normal breath sounds.  Abdominal:     General: Bowel sounds are  normal.     Palpations: Abdomen is soft.  Musculoskeletal:     Cervical back: Normal range of motion and neck supple.     Right lower leg: No edema.     Left lower leg: No edema.  Lymphadenopathy:     Cervical: No cervical adenopathy.  Skin:    General: Skin is warm and dry.     Findings: Rash present. Rash is scaling.     Comments: Two small, round rash-like raised areas to right lower leg.  Round with scaly raised appearance exterior, mild erythema, and paler pink interior.  Skin intact with no vesicles.  Neurological:     Mental Status: She is alert and oriented to person, place, and time.     Deep Tendon Reflexes: Reflexes are normal and symmetric.     Reflex Scores:      Brachioradialis reflexes are 2+ on the right side and 2+ on the left side.      Patellar reflexes are 2+ on the right side and 2+ on the left side. Psychiatric:        Attention and Perception: Attention normal.        Mood and Affect: Mood normal.        Speech: Speech normal.        Behavior: Behavior normal. Behavior is cooperative.        Thought Content: Thought content normal.    Results for orders placed or performed in visit on 12/21/21  Microalbumin, Urine Waived  Result Value Ref Range   Microalb, Ur Waived 10 0 - 19 mg/L   Creatinine, Urine Waived 50 10 - 300 mg/dL   Microalb/Creat Ratio <30 <30 mg/g      Assessment & Plan:   Problem List Items Addressed This Visit       Cardiovascular and Mediastinum   Essential hypertension, benign    Chronic, ongoing with no medications at this time.  BP at goal for age.   Will continue diet focus and if needed in future restart HCTZ which has worked well in past, or add on ARB, avoid ACE due to COPD.  Labs today to include CBC, CMP, TSH, urine ALB.  Return in 6 months.      Relevant Medications   rosuvastatin (CRESTOR) 10 MG tablet   Other Relevant Orders   Comprehensive metabolic panel   CBC with Differential/Platelet   Microalbumin, Urine Waived (Completed)     Respiratory   COPD (chronic obstructive pulmonary disease) (HCC) - Primary    Chronic, ongoing.  Last exacerbation 11/15/21.  Continue current medication regimen and adjust as needed.  Add on Singulair for ongoing postnasal drainage with poor response to OTC antihistamine treatment, educated her on this + BLACK BOX warning -- she wishes to trial this.  Plan for spirometry at next visit.  CBC today.  Refills sent in.      Relevant Medications   budesonide-formoterol (SYMBICORT) 160-4.5 MCG/ACT inhaler   albuterol (VENTOLIN HFA) 108 (90 Base) MCG/ACT inhaler   loratadine (CLARITIN) 10 MG tablet   montelukast (SINGULAIR) 10 MG tablet   Other Relevant Orders   CBC with Differential/Platelet     Musculoskeletal and Integument   Osteopenia of neck of left femur    Ongoing noted on recent DEXA, some decline from previous with T-score -2.3 and previous in 2015 was -1.2.  Educated her on these findings and current treatment.  She is to take Vitamin D3 2000 units daily and intake adequate calcium daily +  perform weight bearing exercises. Check Vit D level today and plan on repeat DEXA in 5 years, sooner if fracture presents.      Relevant Orders   VITAMIN D 25 Hydroxy (Vit-D Deficiency, Fractures)   Tinea corporis    Ongoing with flares to legs == ?tinea vs nummular.  Will send in Nystatin cream to treat and monitor, if worsening or ongoing them consider trial of steroid cream or referral to dermatology.      Relevant Medications   nystatin cream (MYCOSTATIN)     Other   Anxiety    Refer to  depression plan of care.      Relevant Medications   busPIRone (BUSPAR) 5 MG tablet   sertraline (ZOLOFT) 100 MG tablet   Depression, recurrent (HCC)    Chronic, stable.  Will continue Sertraline and Buspar at this time.  Buspar safer alternative to take for increased anxiety.  Denies SI/HI.  Return to office in 6 months.      Relevant Medications   busPIRone (BUSPAR) 5 MG tablet   sertraline (ZOLOFT) 100 MG tablet   Elevated TSH    No current symptoms.  Recheck TSH and Free T4.      Relevant Orders   T4, free   TSH   Hyperlipidemia    Chronic, ongoing.  Continue current medication regimen and adjust as needed.  Lipid panel today.      Relevant Medications   rosuvastatin (CRESTOR) 10 MG tablet   Other Relevant Orders   Comprehensive metabolic panel   Lipid Panel w/o Chol/HDL Ratio   Vitamin D deficiency disease    Chronic, ongoing.  Check level today and continue supplement.        Relevant Orders   VITAMIN D 25 Hydroxy (Vit-D Deficiency, Fractures)     Follow up plan: Return in about 6 months (around 06/20/2022) for Annual physical with spirometry.

## 2021-12-21 NOTE — Assessment & Plan Note (Signed)
Chronic, ongoing.  Continue current medication regimen and adjust as needed. Lipid panel today. 

## 2021-12-21 NOTE — Patient Instructions (Signed)
COPD and Physical Activity Chronic obstructive pulmonary disease (COPD) is a long-term, or chronic, condition that affects the lungs. COPD is a general term that can be used to describe many problems that cause inflammation of the lungs and limit airflow. These conditions include chronic bronchitis and emphysema. The main symptom of COPD is shortness of breath, which makes it harder to do even simple tasks. This can also make it harder to exercise and stay active. Talk with your health care provider about treatments to help you breathe better and actions you can take to prevent breathing problems during physical activity. What are the benefits of exercising when you have COPD? Exercising regularly is an important part of a healthy lifestyle. You can still exercise and do physical activities even though you have COPD. Exercise and physical activity improve your shortness of breath by increasing blood flow (circulation). This causes your heart to pump more oxygen through your body. Moderate exercise can: Improve oxygen use. Increase your energy level. Help with shortness of breath. Strengthen your breathing muscles. Improve heart health. Help with sleep. Improve your self-esteem and feelings of self-worth. Lower depression, stress, and anxiety. Exercise can benefit everyone with COPD. The severity of your disease may affect how hard you can exercise, especially at first, but everyone can benefit. Talk with your health care provider about how much exercise is safe for you, and which activities and exercises are safe for you. What actions can I take to prevent breathing problems during physical activity? Sign up for a pulmonary rehabilitation program. This type of program may include: Education about lung diseases. Exercise classes that teach you how to exercise and be more active while improving your breathing. This usually involves: Exercise using your lower extremities, such as a stationary  bicycle. About 30 minutes of exercise, 2 to 5 times per week, for 6 to 12 weeks. Strength training, such as push-ups or leg lifts. Nutrition education. Group classes in which you can talk with others who also have COPD and learn ways to manage stress. If you use an oxygen tank, you should use it while you exercise. Work with your health care provider to adjust your oxygen for your physical activity. Your resting flow rate is different from your flow rate during physical activity. How to manage your breathing while exercising While you are exercising: Take slow breaths. Pace yourself, and do nottry to go too fast. Purse your lips while breathing out. Pursing your lips is similar to a kissing or whistling position. If doing exercise that uses a quick burst of effort, such as weight lifting: Breathe in before starting the exercise. Breathe out during the hardest part of the exercise, such as raising the weights. Where to find support You can find support for exercising with COPD from: Your health care provider. A pulmonary rehabilitation program. Your local health department or community health programs. Support groups, either online or in-person. Your health care provider may be able to recommend support groups. Where to find more information You can find more information about exercising with COPD from: American Lung Association: lung.org COPD Foundation: copdfoundation.org Contact a health care provider if: Your symptoms get worse. You have nausea. You have a fever. You want to start a new exercise program or a new activity. Get help right away if: You have chest pain. You cannot breathe. These symptoms may represent a serious problem that is an emergency. Do not wait to see if the symptoms will go away. Get medical help right away. Call   your local emergency services (911 in the [place] Do not drive yourself to the hospital. Summary COPD is a general term that can be used to describe  many different lung problems that cause lung inflammation and limit airflow. This includes chronic bronchitis and emphysema. Exercise and physical activity improve your shortness of breath by increasing blood flow (circulation). This causes your heart to provide more oxygen to your body. Contact your health care provider before starting any exercise program or new activity. Ask your health care provider what exercises and activities are safe for you. This information is not intended to replace advice given to you by your health care provider. Make sure you discuss any questions you have with your health care provider. Document Revised: 09/29/2020 Document Reviewed: 09/29/2020 Elsevier Patient Education  2022 [employer]  

## 2021-12-21 NOTE — Assessment & Plan Note (Signed)
Ongoing noted on recent DEXA, some decline from previous with T-score -2.3 and previous in 2015 was -1.2.  Educated her on these findings and current treatment.  She is to take Vitamin D3 2000 units daily and intake adequate calcium daily + perform weight bearing exercises. Check Vit D level today and plan on repeat DEXA in 5 years, sooner if fracture presents. 

## 2021-12-22 ENCOUNTER — Encounter: Payer: Self-pay | Admitting: Nurse Practitioner

## 2021-12-22 LAB — CBC WITH DIFFERENTIAL/PLATELET
Basophils Absolute: 0 10*3/uL (ref 0.0–0.2)
Basos: 1 %
EOS (ABSOLUTE): 0.1 10*3/uL (ref 0.0–0.4)
Eos: 1 %
Hematocrit: 38.5 % (ref 34.0–46.6)
Hemoglobin: 13.2 g/dL (ref 11.1–15.9)
Immature Grans (Abs): 0 10*3/uL (ref 0.0–0.1)
Immature Granulocytes: 0 %
Lymphocytes Absolute: 1.2 10*3/uL (ref 0.7–3.1)
Lymphs: 22 %
MCH: 32.9 pg (ref 26.6–33.0)
MCHC: 34.3 g/dL (ref 31.5–35.7)
MCV: 96 fL (ref 79–97)
Monocytes Absolute: 0.4 10*3/uL (ref 0.1–0.9)
Monocytes: 7 %
Neutrophils Absolute: 3.7 10*3/uL (ref 1.4–7.0)
Neutrophils: 69 %
Platelets: 186 10*3/uL (ref 150–450)
RBC: 4.01 x10E6/uL (ref 3.77–5.28)
RDW: 11.7 % (ref 11.7–15.4)
WBC: 5.4 10*3/uL (ref 3.4–10.8)

## 2021-12-22 LAB — COMPREHENSIVE METABOLIC PANEL
ALT: 14 IU/L (ref 0–32)
AST: 21 IU/L (ref 0–40)
Albumin/Globulin Ratio: 1.8 (ref 1.2–2.2)
Albumin: 4.4 g/dL (ref 3.7–4.7)
Alkaline Phosphatase: 84 IU/L (ref 44–121)
BUN/Creatinine Ratio: 14 (ref 12–28)
BUN: 12 mg/dL (ref 8–27)
Bilirubin Total: 0.4 mg/dL (ref 0.0–1.2)
CO2: 27 mmol/L (ref 20–29)
Calcium: 9.6 mg/dL (ref 8.7–10.3)
Chloride: 98 mmol/L (ref 96–106)
Creatinine, Ser: 0.86 mg/dL (ref 0.57–1.00)
Globulin, Total: 2.4 g/dL (ref 1.5–4.5)
Glucose: 84 mg/dL (ref 70–99)
Potassium: 4 mmol/L (ref 3.5–5.2)
Sodium: 136 mmol/L (ref 134–144)
Total Protein: 6.8 g/dL (ref 6.0–8.5)
eGFR: 70 mL/min/{1.73_m2} (ref >59)

## 2021-12-22 LAB — LIPID PANEL W/O CHOL/HDL RATIO
Cholesterol, Total: 211 mg/dL — ABNORMAL HIGH (ref 100–199)
HDL: 81 mg/dL (ref >39)
LDL Chol Calc (NIH): 117 mg/dL — ABNORMAL HIGH (ref 0–99)
Triglycerides: 74 mg/dL (ref 0–149)
VLDL Cholesterol Cal: 13 mg/dL (ref 5–40)

## 2021-12-22 LAB — VITAMIN D 25 HYDROXY (VIT D DEFICIENCY, FRACTURES): Vit D, 25-Hydroxy: 37.6 ng/mL (ref 30.0–100.0)

## 2021-12-22 LAB — T4, FREE: Free T4: 1.17 ng/dL (ref 0.82–1.77)

## 2021-12-22 LAB — TSH: TSH: 1.92 u[IU]/mL (ref 0.450–4.500)

## 2021-12-22 NOTE — Progress Notes (Signed)
Contacted via Shelly Bridges afternoon Shelly Bridges, your labs have returned: - Cholesterol levels are above goal. Are you taking Rosuvastatin daily and tolerating?  If so I would like to increase to 20 MG daily, let me know and I will send in increase once you do. - Thyroid lab is normal this check, no medications needed. - Kidney function, creatinine and eGFR, remains normal, as is liver function, AST and ALT.   - CBC shows no anemia or infection.  Any questions? Keep being amazing!!  Thank you for allowing me to participate in your care.  I appreciate you. Kindest regards, Yanilen Adamik

## 2021-12-23 ENCOUNTER — Other Ambulatory Visit: Payer: Self-pay | Admitting: Nurse Practitioner

## 2021-12-23 MED ORDER — ROSUVASTATIN CALCIUM 20 MG PO TABS: 20.0000 mg | ORAL_TABLET | Freq: Every day | ORAL | 4 refills | Status: DC

## 2022-01-24 DIAGNOSIS — H35363 Drusen (degenerative) of macula, bilateral: Secondary | ICD-10-CM | POA: Diagnosis not present

## 2022-01-24 DIAGNOSIS — H25013 Cortical age-related cataract, bilateral: Secondary | ICD-10-CM | POA: Diagnosis not present

## 2022-01-24 DIAGNOSIS — H353131 Nonexudative age-related macular degeneration, bilateral, early dry stage: Secondary | ICD-10-CM | POA: Diagnosis not present

## 2022-01-24 DIAGNOSIS — H2511 Age-related nuclear cataract, right eye: Secondary | ICD-10-CM | POA: Diagnosis not present

## 2022-01-24 DIAGNOSIS — H2513 Age-related nuclear cataract, bilateral: Secondary | ICD-10-CM | POA: Diagnosis not present

## 2022-01-24 DIAGNOSIS — H35033 Hypertensive retinopathy, bilateral: Secondary | ICD-10-CM | POA: Diagnosis not present

## 2022-01-24 DIAGNOSIS — H524 Presbyopia: Secondary | ICD-10-CM | POA: Diagnosis not present

## 2022-01-24 DIAGNOSIS — H52213 Irregular astigmatism, bilateral: Secondary | ICD-10-CM | POA: Diagnosis not present

## 2022-01-26 ENCOUNTER — Encounter: Payer: Self-pay | Admitting: Nurse Practitioner

## 2022-02-08 DIAGNOSIS — H2511 Age-related nuclear cataract, right eye: Secondary | ICD-10-CM | POA: Diagnosis not present

## 2022-02-08 DIAGNOSIS — H25811 Combined forms of age-related cataract, right eye: Secondary | ICD-10-CM | POA: Diagnosis not present

## 2022-02-15 DIAGNOSIS — H2511 Age-related nuclear cataract, right eye: Secondary | ICD-10-CM | POA: Diagnosis not present

## 2022-03-05 HISTORY — PX: CATARACT EXTRACTION: SUR2

## 2022-03-09 DIAGNOSIS — H25012 Cortical age-related cataract, left eye: Secondary | ICD-10-CM | POA: Diagnosis not present

## 2022-03-09 DIAGNOSIS — H2512 Age-related nuclear cataract, left eye: Secondary | ICD-10-CM | POA: Diagnosis not present

## 2022-03-09 DIAGNOSIS — Z961 Presence of intraocular lens: Secondary | ICD-10-CM | POA: Diagnosis not present

## 2022-03-15 DIAGNOSIS — H2512 Age-related nuclear cataract, left eye: Secondary | ICD-10-CM | POA: Diagnosis not present

## 2022-03-15 DIAGNOSIS — H25012 Cortical age-related cataract, left eye: Secondary | ICD-10-CM | POA: Diagnosis not present

## 2022-03-15 DIAGNOSIS — H25812 Combined forms of age-related cataract, left eye: Secondary | ICD-10-CM | POA: Diagnosis not present

## 2022-03-22 DIAGNOSIS — H2512 Age-related nuclear cataract, left eye: Secondary | ICD-10-CM | POA: Diagnosis not present

## 2022-03-24 ENCOUNTER — Other Ambulatory Visit: Payer: Self-pay | Admitting: Nurse Practitioner

## 2022-03-25 NOTE — Telephone Encounter (Signed)
Dose inconsistent with current med list. ?Requested Prescriptions  ?Pending Prescriptions Disp Refills  ?? rosuvastatin (CRESTOR) 10 MG tablet [Pharmacy Med Name: ROSUVASTATIN '10MG'$  TABLETS] 90 tablet 4  ?  Sig: TAKE 1 TABLET(10 MG) BY MOUTH DAILY  ?  ? Cardiovascular:  Antilipid - Statins 2 Failed - 03/24/2022  4:01 PM  ?  ?  Failed - Lipid Panel in normal range within the last 12 months  ?  Cholesterol, Total  ?Date Value Ref Range Status  ?12/21/2021 211 (H) 100 - 199 mg/dL Final  ? ?LDL Chol Calc (NIH)  ?Date Value Ref Range Status  ?12/21/2021 117 (H) 0 - 99 mg/dL Final  ? ?HDL  ?Date Value Ref Range Status  ?12/21/2021 81 >39 mg/dL Final  ? ?Triglycerides  ?Date Value Ref Range Status  ?12/21/2021 74 0 - 149 mg/dL Final  ? ?  ?  ?  Passed - Cr in normal range and within 360 days  ?  Creatinine, Ser  ?Date Value Ref Range Status  ?12/21/2021 0.86 0.57 - 1.00 mg/dL Final  ?   ?  ?  Passed - Patient is not pregnant  ?  ?  Passed - Valid encounter within last 12 months  ?  Recent Outpatient Visits   ?      ? 3 months ago Simple chronic bronchitis ([place])  ? [place], [place] T, NP  ? 4 months ago COPD exacerbation ([place])  ? [place], [place] P, DO  ? 1 year ago Simple chronic bronchitis ([place])  ? [place], Henrine Screws T, NP  ? 1 year ago Essential hypertension, benign  ? [place], Henrine Screws T, NP  ? 2 years ago Routine general medical examination at a health care facility  ? [facility] Kathrine Haddock, NP  ?  ?  ?Future Appointments   ?        ? In 2 months Cannady, Barbaraann Faster, NP [facility]  ?  ? ?  ?  ?  ? ? ? ?

## 2022-05-13 ENCOUNTER — Other Ambulatory Visit: Payer: Self-pay | Admitting: Nurse Practitioner

## 2022-05-13 MED ORDER — BUDESONIDE-FORMOTEROL FUMARATE 160-4.5 MCG/ACT IN AERO: 2.0000 | INHALATION_SPRAY | Freq: Two times a day (BID) | RESPIRATORY_TRACT | 4 refills | Status: DC

## 2022-05-13 NOTE — Telephone Encounter (Signed)
Medication Refill - Medication: budesonide-formoterol (SYMBICORT) 160-4.5 MCG/ACT inhaler  Has the patient contacted their pharmacy? Yes.   Pt told to contact provider  Preferred Pharmacy (with phone number or street name):  [facility] Phone:  [phone]  Fax:  [phone]     Has the patient been seen for an appointment in the last year OR does the patient have an upcoming appointment? Yes.    Agent: Please be advised that RX refills may take up to 3 business days. We ask that you follow-up with your pharmacy.

## 2022-05-13 NOTE — Telephone Encounter (Signed)
Requested Prescriptions  Pending Prescriptions Disp Refills  . budesonide-formoterol (SYMBICORT) 160-4.5 MCG/ACT inhaler 20.4 g 4    Sig: Inhale 2 puffs into the lungs 2 (two) times daily.     Pulmonology:  Combination Products Passed - 05/13/2022 12:45 PM      Passed - Valid encounter within last 12 months    Recent Outpatient Visits          4 months ago Simple chronic bronchitis ([place])   [place], [place] T, NP   5 months ago COPD exacerbation [facility])   [place], Megan P, DO   1 year ago Simple chronic bronchitis ([place])   [place], Henrine Screws T, NP   2 years ago Essential hypertension, benign   [place], Henrine Screws T, NP   2 years ago Routine general medical examination at a health care facility   [place], NP      Future Appointments            In 1 month Cannady, Barbaraann Faster, NP [facility]

## 2022-06-18 NOTE — Patient Instructions (Signed)

## 2022-06-20 ENCOUNTER — Ambulatory Visit: Payer: Medicare PPO | Admitting: Nurse Practitioner

## 2022-06-20 ENCOUNTER — Encounter: Payer: Self-pay | Admitting: Nurse Practitioner

## 2022-06-20 VITALS — BP 126/73 | HR 60 | Temp 98.8°F | Ht 60.0 in | Wt 141.4 lb

## 2022-06-20 DIAGNOSIS — M85852 Other specified disorders of bone density and structure, left thigh: Secondary | ICD-10-CM

## 2022-06-20 DIAGNOSIS — E7801 Familial hypercholesterolemia: Secondary | ICD-10-CM

## 2022-06-20 DIAGNOSIS — Z0000 Encounter for general adult medical examination without abnormal findings: Secondary | ICD-10-CM | POA: Diagnosis not present

## 2022-06-20 DIAGNOSIS — F419 Anxiety disorder, unspecified: Secondary | ICD-10-CM

## 2022-06-20 DIAGNOSIS — F339 Major depressive disorder, recurrent, unspecified: Secondary | ICD-10-CM

## 2022-06-20 DIAGNOSIS — I10 Essential (primary) hypertension: Secondary | ICD-10-CM | POA: Diagnosis not present

## 2022-06-20 DIAGNOSIS — J410 Simple chronic bronchitis: Secondary | ICD-10-CM

## 2022-06-20 DIAGNOSIS — R7989 Other specified abnormal findings of blood chemistry: Secondary | ICD-10-CM

## 2022-06-20 DIAGNOSIS — E559 Vitamin D deficiency, unspecified: Secondary | ICD-10-CM | POA: Diagnosis not present

## 2022-06-20 DIAGNOSIS — Z87891 Personal history of nicotine dependence: Secondary | ICD-10-CM

## 2022-06-20 MED ORDER — TIOTROPIUM BROMIDE-OLODATEROL 2.5-2.5 MCG/ACT IN AERS: 2.0000 | INHALATION_SPRAY | Freq: Every day | RESPIRATORY_TRACT | 12 refills | Status: DC

## 2022-06-20 MED ORDER — ALBUTEROL SULFATE HFA 108 (90 BASE) MCG/ACT IN AERS: INHALATION_SPRAY | RESPIRATORY_TRACT | 3 refills | Status: DC

## 2022-06-20 NOTE — Assessment & Plan Note (Signed)
Refer to depression plan of care. 

## 2022-06-20 NOTE — Progress Notes (Signed)
BP 126/73   Pulse 60   Temp 98.8 F (37.1 C) (Oral)   Ht 5' (1.524 m)   Wt 141 lb 6.4 oz (64.1 kg)   SpO2 96%   BMI 27.62 kg/m    Subjective:    Patient ID: Shelly Bridges, female    DOB: July 16, 1946, 76 y.o.   MRN: [id number]  NOTE WRITTEN BY FNP STUDENT.  ASSESSMENT AND PLAN OF CARE REVIEWED WITH STUDENT, AGREE WITH ABOVE FINDINGS AND PLAN.   HPI: Shelly Bridges is a 76 y.o. female presenting on 06/20/2022 for comprehensive medical examination. Current medical complaints include:none  She currently lives with: son Menopausal Symptoms: no  HYPERTENSION / HYPERLIPIDEMIA Continues on Crestor -- no current medications for HTN.  History of elevation in TSH, but recent improved. Satisfied with current treatment? yes Duration of hypertension: years BP monitoring frequency: rarely BP range:  BP medication side effects: no medication Duration of hyperlipidemia: chronic Cholesterol medication side effects: no Cholesterol supplements: none Medication compliance: excellent compliance Aspirin: no Recent stressors: no Recurrent headaches: no Visual changes: no Palpitations: no Dyspnea: yes Chest pain: no Lower extremity edema: no Dizzy/lightheaded: no   OSTEOPENIA Noted on DEXA screening, recent DEXA noted ongoing osteopenia, but T-score went from -1.2 to -2.3 this check. Satisfied with current treatment?: yes Adequate calcium & vitamin D: yes Weight bearing exercises: no   COPD Continues on Symbicort and Albuterol + Singulair and Claritin for allergy triggers. She says has been getting out of breath lately with walking.  COPD status: uncontrolled Satisfied with current treatment?: yes Oxygen use: no Dyspnea frequency: daily Cough frequency: none Rescue inhaler frequency: 2-3 times a day Limitation of activity: yes Productive cough:  Last Spirometry: Today FEV1 52% and FEV1/FVC 113% Pneumovax: Up to Date Influenza: Up to Date    DEPRESSION Continues on Sertraline and Buspar daily. Mood status: stable Satisfied with current treatment?: yes Symptom severity: mild  Duration of current treatment : years Side effects: no Medication compliance: excellent compliance Psychotherapy/counseling: no  Depressed mood: no Anxious mood: no Anhedonia: no Significant weight loss or gain: no Insomnia: no  Fatigue: no Feelings of worthlessness or guilt: no Impaired concentration/indecisiveness: no Suicidal ideations: no Hopelessness: no Crying spells: no     06/20/2022    9:42 AM 12/21/2021    1:33 PM 11/15/2021   10:52 AM 11/08/2021   11:20 AM 11/13/2020    9:56 AM  Depression screen PHQ 2/9  Decreased Interest 1 1 0 0 0  Down, Depressed, Hopeless 1 1 0 0 0  PHQ - 2 Score 2 2 0 0 0  Altered sleeping '1 2 1    ' Tired, decreased energy '1 1 1    ' Change in appetite '1 1 1    ' Feeling bad or failure about yourself  '1 1 1    ' Trouble concentrating '1 1 1    ' Moving slowly or fidgety/restless 0 0 0    Suicidal thoughts 0 0 0    PHQ-9 Score '7 8 5    ' Difficult doing work/chores Not difficult at all            10/28/2019    8:20 AM 11/06/2020   10:43 AM 11/13/2020    9:55 AM 11/08/2021   11:21 AM 06/20/2022    9:41 AM  [place] in the past year? 0 0 0 0 0  Was there an injury with Fall? 0   0 0  Fall Risk Category Calculator 0  0 0  Fall Risk Category Low   Low Low  Patient Fall Risk Level Low fall risk Low fall risk Low fall risk Low fall risk Low fall risk  Patient at Risk for Falls Due to     No Fall Risks  Fall risk Follow up  Falls evaluation completed;Education provided;Falls prevention discussed Falls evaluation completed Falls evaluation completed;Falls prevention discussed Falls evaluation completed    Past Medical History:  Past Medical History:  Diagnosis Date   Anxiety    Asthma    Lipoma    Neutropenia ([place])    Osteoporosis    Vitamin D deficiency disease     Surgical History:  Past  Surgical History:  Procedure Laterality Date   CATARACT EXTRACTION Bilateral 03/2022    Medications:  Current Outpatient Medications on File Prior to Visit  Medication Sig   augmented betamethasone dipropionate (DIPROLENE AF) 0.05 % cream Apply topically 2 (two) times daily.   busPIRone (BUSPAR) 5 MG tablet Take 1 tablet (5 mg total) by mouth 2 (two) times daily as needed.   loratadine (CLARITIN) 10 MG tablet Take 10 mg by mouth daily.   montelukast (SINGULAIR) 10 MG tablet Take 1 tablet (10 mg total) by mouth at bedtime.   nystatin cream (MYCOSTATIN) Apply 1 application topically 2 (two) times daily.   rosuvastatin (CRESTOR) 20 MG tablet Take 1 tablet (20 mg total) by mouth daily.   sertraline (ZOLOFT) 100 MG tablet Take 1 tablet (100 mg total) by mouth daily.   Vitamin D, Ergocalciferol, 50 MCG (2000 UT) CAPS Take 2,000 Units by mouth. 1 x day   No current facility-administered medications on file prior to visit.    Allergies:  No Known Allergies  Social History:  Social History   Socioeconomic History   Marital status: Divorced    Spouse name: Not on file   Number of children: 3   Years of education: Not on file   Highest education level: Not on file  Occupational History   Not on file  Tobacco Use   Smoking status: Former    Packs/day: 1.00    Years: 20.00    Total pack years: 20.00    Types: Cigarettes    Quit date: 06/17/1985    Years since quitting: 37.0   Smokeless tobacco: Never  Vaping Use   Vaping Use: Never used  Substance and Sexual Activity   Alcohol use: No   Drug use: No   Sexual activity: Not Currently  Other Topics Concern   Not on file  Social History Narrative   Not on file   Social Determinants of Health   Financial Resource Strain: Low Risk  (11/08/2021)   Overall Financial Resource Strain (CARDIA)    Difficulty of Paying Living Expenses: Not hard at all  Food Insecurity: No Food Insecurity (11/08/2021)   Hunger Vital Sign    Worried  About Running Out of Food in the Last Year: Never true    [place] in the Last Year: Never true  Transportation Needs: No Transportation Needs (11/08/2021)   PRAPARE - Hydrologist (Medical): No    Lack of Transportation (Non-Medical): No  Physical Activity: Sufficiently Active (11/08/2021)   Exercise Vital Sign    Days of Exercise per Week: 4 days    Minutes of Exercise per Session: 40 min  Stress: No Stress Concern Present (11/08/2021)   [place]    Feeling  of Stress : Not at all  Social Connections: Socially Isolated (11/08/2021)   Social Connection and Isolation Panel [NHANES]    Frequency of Communication with Friends and Family: More than three times a week    Frequency of Social Gatherings with Friends and Family: More than three times a week    Attends Religious Services: Never    Marine scientist or Organizations: No    Attends Archivist Meetings: Never    Marital Status: Widowed  Intimate Partner Violence: Not At Risk (11/08/2021)   Humiliation, Afraid, Rape, and Kick questionnaire    Fear of Current or Ex-Partner: No    Emotionally Abused: No    Physically Abused: No    Sexually Abused: No   Social History   Tobacco Use  Smoking Status Former   Packs/day: 1.00   Years: 20.00   Total pack years: 20.00   Types: Cigarettes   Quit date: 06/17/1985   Years since quitting: 37.0  Smokeless Tobacco Never   Social History   Substance and Sexual Activity  Alcohol Use No    Family History:  Family History  Problem Relation Age of Onset   Dementia Mother    Hypertension Mother    Osteoporosis Mother    Stroke Mother        mini stroke   Cancer Father        brain tumor and lung   Hypertension Father    Emphysema Father    COPD Neg Hx    Heart disease Neg Hx     Past medical history, surgical history, medications, allergies, family history and  social history reviewed with patient today and changes made to appropriate areas of the chart.   Review of Systems  Constitutional:  Negative for fever, malaise/fatigue and weight loss.  HENT: Negative.    Eyes: Negative.   Respiratory:  Positive for shortness of breath. Negative for cough and wheezing.   Cardiovascular:  Negative for chest pain, palpitations and leg swelling.  Gastrointestinal: Negative.   Musculoskeletal: Negative.   Neurological: Negative.   Endo/Heme/Allergies: Negative.    All other ROS negative except what is listed above and in the HPI.      Objective:    BP 126/73   Pulse 60   Temp 98.8 F (37.1 C) (Oral)   Ht 5' (1.524 m)   Wt 141 lb 6.4 oz (64.1 kg)   SpO2 96%   BMI 27.62 kg/m   Wt Readings from Last 3 Encounters:  06/20/22 141 lb 6.4 oz (64.1 kg)  12/21/21 136 lb 3.2 oz (61.8 kg)  11/15/21 134 lb 6.4 oz (61 kg)    Physical Exam Vitals and nursing note reviewed. Exam conducted with a chaperone present.  Constitutional:      General: She is not in acute distress.    Appearance: She is not toxic-appearing.  HENT:     Head: Normocephalic and atraumatic.     Right Ear: Hearing, tympanic membrane, ear canal and external ear normal. No drainage.     Left Ear: Hearing, tympanic membrane, ear canal and external ear normal. No drainage.     Nose: Nose normal.     Mouth/Throat:     Lips: Pink.     Mouth: Mucous membranes are moist.     Pharynx: Oropharynx is clear. Uvula midline. No posterior oropharyngeal erythema.  Eyes:     General: Lids are normal.        Right eye: No discharge.  Left eye: No discharge.     Extraocular Movements: Extraocular movements intact.     Conjunctiva/sclera: Conjunctivae normal.     Pupils: Pupils are equal, round, and reactive to light.  Neck:     Thyroid: No thyromegaly or thyroid tenderness.  Cardiovascular:     Rate and Rhythm: Normal rate and regular rhythm.     Pulses: Normal pulses.     Heart sounds:  Normal heart sounds. No murmur heard. Pulmonary:     Effort: Pulmonary effort is normal. No accessory muscle usage.     Breath sounds: Normal breath sounds.  Chest:  Breasts:    Right: Normal.     Left: Normal.  Abdominal:     General: Bowel sounds are normal.     Palpations: Abdomen is soft.     Tenderness: There is no abdominal tenderness. There is no right CVA tenderness or left CVA tenderness.  Musculoskeletal:     Cervical back: Normal range of motion.     Right lower leg: No edema.     Left lower leg: No edema.  Lymphadenopathy:     Head:     Right side of head: No submental, submandibular, tonsillar, preauricular or posterior auricular adenopathy.     Left side of head: No submental, submandibular, tonsillar, preauricular or posterior auricular adenopathy.     Cervical: No cervical adenopathy.  Skin:    General: Skin is warm.     Capillary Refill: Capillary refill takes less than 2 seconds.     Findings: No rash.  Neurological:     General: No focal deficit present.     Mental Status: She is alert and oriented to person, place, and time.     Deep Tendon Reflexes: Reflexes are normal and symmetric.  Psychiatric:        Attention and Perception: Attention normal.        Mood and Affect: Mood normal.        Speech: Speech normal.        Behavior: Behavior normal. Behavior is cooperative.        Thought Content: Thought content normal.        Judgment: Judgment normal.    Results for orders placed or performed in visit on 12/21/21  T4, free  Result Value Ref Range   Free T4 1.17 0.82 - 1.77 ng/dL  Comprehensive metabolic panel  Result Value Ref Range   Glucose 84 70 - 99 mg/dL   BUN 12 8 - 27 mg/dL   Creatinine, Ser 0.86 0.57 - 1.00 mg/dL   eGFR 70 >59 mL/min/1.73   BUN/Creatinine Ratio 14 12 - 28   Sodium 136 134 - 144 mmol/L   Potassium 4.0 3.5 - 5.2 mmol/L   Chloride 98 96 - 106 mmol/L   CO2 27 20 - 29 mmol/L   Calcium 9.6 8.7 - 10.3 mg/dL   Total Protein  6.8 6.0 - 8.5 g/dL   Albumin 4.4 3.7 - 4.7 g/dL   Globulin, Total 2.4 1.5 - 4.5 g/dL   Albumin/Globulin Ratio 1.8 1.2 - 2.2   Bilirubin Total 0.4 0.0 - 1.2 mg/dL   Alkaline Phosphatase 84 44 - 121 IU/L   AST 21 0 - 40 IU/L   ALT 14 0 - 32 IU/L  CBC with Differential/Platelet  Result Value Ref Range   WBC 5.4 3.4 - 10.8 x10E3/uL   RBC 4.01 3.77 - 5.28 x10E6/uL   Hemoglobin 13.2 11.1 - 15.9 g/dL   Hematocrit 38.5 34.0 -  46.6 %   MCV 96 79 - 97 fL   MCH 32.9 26.6 - 33.0 pg   MCHC 34.3 31.5 - 35.7 g/dL   RDW 11.7 11.7 - 15.4 %   Platelets 186 150 - 450 x10E3/uL   Neutrophils 69 Not Estab. %   Lymphs 22 Not Estab. %   Monocytes 7 Not Estab. %   Eos 1 Not Estab. %   Basos 1 Not Estab. %   Neutrophils Absolute 3.7 1.4 - 7.0 x10E3/uL   Lymphocytes Absolute 1.2 0.7 - 3.1 x10E3/uL   Monocytes Absolute 0.4 0.1 - 0.9 x10E3/uL   EOS (ABSOLUTE) 0.1 0.0 - 0.4 x10E3/uL   Basophils Absolute 0.0 0.0 - 0.2 x10E3/uL   Immature Granulocytes 0 Not Estab. %   Immature Grans (Abs) 0.0 0.0 - 0.1 x10E3/uL  Lipid Panel w/o Chol/HDL Ratio  Result Value Ref Range   Cholesterol, Total 211 (H) 100 - 199 mg/dL   Triglycerides 74 0 - 149 mg/dL   HDL 81 >39 mg/dL   VLDL Cholesterol Cal 13 5 - 40 mg/dL   LDL Chol Calc (NIH) 117 (H) 0 - 99 mg/dL  TSH  Result Value Ref Range   TSH 1.920 0.450 - 4.500 uIU/mL  VITAMIN D 25 Hydroxy (Vit-D Deficiency, Fractures)  Result Value Ref Range   Vit D, 25-Hydroxy 37.6 30.0 - 100.0 ng/mL  Microalbumin, Urine Waived  Result Value Ref Range   Microalb, Ur Waived 10 0 - 19 mg/L   Creatinine, Urine Waived 50 10 - 300 mg/dL   Microalb/Creat Ratio <30 <30 mg/g      Assessment & Plan:   Problem List Items Addressed This Visit       Cardiovascular and Mediastinum   Essential hypertension, benign    Chronic, stable with no medications at this time. BP at goal. Will continue diet focus and start medication in the future if needed. Goal is to avoid ACE due to COPD.  Labs today: CBC, CMP, TSH      Relevant Orders   CBC with Differential/Platelet   Comprehensive metabolic panel     Respiratory   COPD (chronic obstructive pulmonary disease) (HCC) - Primary    Chronic, ongoing. Spirometry today FEV1 52% FEV1/FVC 113%. She has been on Symbicort and Albuterol. Will switch Symbicort to Stiolto and continue Albuterol as needed. Continue Singulair and Claritin for allergies as she is tolerating well. CBC today. Return in 8 weeks.       Relevant Medications   Tiotropium Bromide-Olodaterol 2.5-2.5 MCG/ACT AERS   albuterol (VENTOLIN HFA) 108 (90 Base) MCG/ACT inhaler   Other Relevant Orders   Spirometry with Graph (Completed)     Musculoskeletal and Integument   Osteopenia of neck of left femur    Ongoing noted on recent DEXA, some decline from previous with T-score -2.3 and previous in 2015 was -1.2.  Educated her on these findings and current treatment.  She is to take Vitamin D3 2000 units daily and intake adequate calcium daily + perform weight bearing exercises. Check Vit D level today and plan on repeat DEXA in 5 years, sooner if fracture presents.      Relevant Orders   VITAMIN D 25 Hydroxy (Vit-D Deficiency, Fractures)     Other   Anxiety    Refer to depression plan of care.      Depression, recurrent (HCC)    Chronic, stable.  Will continue Sertraline and Buspar at this time.  Buspar safer alternative to take for increased anxiety.  Denies SI/HI.       Elevated TSH    No current symptoms. Recheck TSH and Free T4.       Relevant Orders   T4, free   Thyroid peroxidase antibody   TSH   Former light tobacco smoker    Recommend continued cessation.       Hyperlipidemia    Chronic, ongoing. Continue current medication regimen and adjust as needed. Lipid panel today.       Relevant Orders   Lipid Panel w/o Chol/HDL Ratio   Comprehensive metabolic panel   Vitamin D deficiency disease    Chronic, ongoing.  Check level today and  continue supplement.        Relevant Orders   VITAMIN D 25 Hydroxy (Vit-D Deficiency, Fractures)   Other Visit Diagnoses     Encounter for annual physical exam       Annual physical with labs today and health maintenance reviewed with patient.        Follow up plan: Return in about 8 weeks (around 08/15/2022) for COPD with change to Stiolto.   LABORATORY TESTING:  - Pap smear: up to date  IMMUNIZATIONS:   - Tdap: Tetanus vaccination status reviewed: last tetanus booster within 10 years. - Influenza: Up to date - Pneumovax: Up to date - Prevnar: Up to date - COVID: Up to date - HPV: Not applicable - Shingrix vaccine: Up to date  SCREENING: -Mammogram: Up to date  - Colonoscopy: Up to date  - Bone Density: Up to date  -Hearing Test: Not applicable  -Spirometry: Ordered today   PATIENT COUNSELING:   Advised to take 1 mg of folate supplement per day if capable of pregnancy.   Sexuality: Discussed sexually transmitted diseases, partner selection, use of condoms, avoidance of unintended pregnancy  and contraceptive alternatives.   Advised to avoid cigarette smoking.  I discussed with the patient that most people either abstain from alcohol or drink within safe limits (<=14/week and <=4 drinks/occasion for males, <=7/weeks and <= 3 drinks/occasion for females) and that the risk for alcohol disorders and other health effects rises proportionally with the number of drinks per week and how often a drinker exceeds daily limits.  Discussed cessation/primary prevention of drug use and availability of treatment for abuse.   Diet: Encouraged to adjust caloric intake to maintain  or achieve ideal body weight, to reduce intake of dietary saturated fat and total fat, to limit sodium intake by avoiding high sodium foods and not adding table salt, and to maintain adequate dietary potassium and calcium preferably from fresh fruits, vegetables, and low-fat dairy products.    Stressed the  importance of regular exercise  Injury prevention: Discussed safety belts, safety helmets, smoke detector, smoking near bedding or upholstery.   Dental health: Discussed importance of regular tooth brushing, flossing, and dental visits.    NEXT PREVENTATIVE PHYSICAL DUE IN 1 YEAR. Return in about 8 weeks (around 08/15/2022) for COPD with change to [place].

## 2022-06-20 NOTE — Assessment & Plan Note (Signed)
Recommend continued cessation.

## 2022-06-20 NOTE — Assessment & Plan Note (Signed)
Ongoing noted on recent DEXA, some decline from previous with T-score -2.3 and previous in 2015 was -1.2.  Educated her on these findings and current treatment.  She is to take Vitamin D3 2000 units daily and intake adequate calcium daily + perform weight bearing exercises. Check Vit D level today and plan on repeat DEXA in 5 years, sooner if fracture presents.

## 2022-06-20 NOTE — Assessment & Plan Note (Signed)
No current symptoms. Recheck TSH and Free T4.

## 2022-06-20 NOTE — Assessment & Plan Note (Signed)
Chronic, ongoing.  Continue current medication regimen and adjust as needed. Lipid panel today. 

## 2022-06-20 NOTE — Assessment & Plan Note (Signed)
Chronic, stable with no medications at this time. BP at goal. Will continue diet focus and start medication in the future if needed. Goal is to avoid ACE due to COPD. Labs today: CBC, CMP, TSH

## 2022-06-20 NOTE — Progress Notes (Deleted)
BP 126/73   Pulse 60   Temp 98.8 F (37.1 C) (Oral)   Ht 5' (1.524 m)   Wt 141 lb 6.4 oz (64.1 kg)   SpO2 96%   BMI 27.62 kg/m    Subjective:    Patient ID: Shelly Bridges, female    DOB: 11/30/1946, 76 y.o.   MRN: [id number]  HPI: Shelly Bridges is a 76 y.o. female presenting on 06/20/2022 for comprehensive medical examination. Current medical complaints include:{Blank single:19197::"none","***"}  She currently lives with: Menopausal Symptoms: {Blank single:19197::"yes","no"}  HYPERTENSION / HYPERLIPIDEMIA Continues on Crestor -- no current medications for HTN.  History of elevation in TSH, but recent improved. Satisfied with current treatment? {Blank single:19197::"yes","no"} Duration of hypertension: {Blank single:19197::"chronic","months","years"} BP monitoring frequency: {Blank single:19197::"not checking","rarely","daily","weekly","monthly","a few times a day","a few times a week","a few times a month"} BP range:  BP medication side effects: {Blank single:19197::"yes","no"} Duration of hyperlipidemia: {Blank single:19197::"chronic","months","years"} Cholesterol medication side effects: {Blank single:19197::"yes","no"} Cholesterol supplements: {Blank multiple:19196::"none","fish oil","niacin","red yeast rice"} Medication compliance: {Blank single:19197::"excellent compliance","good compliance","fair compliance","poor compliance"} Aspirin: {Blank single:19197::"yes","no"} Recent stressors: {Blank single:19197::"yes","no"} Recurrent headaches: {Blank single:19197::"yes","no"} Visual changes: {Blank single:19197::"yes","no"} Palpitations: {Blank single:19197::"yes","no"} Dyspnea: {Blank single:19197::"yes","no"} Chest pain: {Blank single:19197::"yes","no"} Lower extremity edema: {Blank single:19197::"yes","no"} Dizzy/lightheaded: {Blank single:19197::"yes","no"}   OSTEOPENIA Noted on DEXA screening, recent DEXA noted ongoing osteopenia, but T-score went  from -1.2 to -2.3 this check. Satisfied with current treatment?: {Blank single:19197::"yes","no"} Adequate calcium & vitamin D: {Blank single:19197::"yes","no"} Weight bearing exercises: {Blank single:19197::"yes","no"}   COPD Continues on Symbicort and Albuterol + Singulair and Claritin for allergy triggers. COPD status: {Blank single:19197::"controlled","uncontrolled","better","worse","exacerbated","stable"} Satisfied with current treatment?: {Blank single:19197::"yes","no"} Oxygen use: {Blank single:19197::"yes","no"} Dyspnea frequency:  Cough frequency:  Rescue inhaler frequency:   Limitation of activity: {Blank single:19197::"yes","no"} Productive cough:  Last Spirometry: Today FEV1 52% and FEV1/FVC 113% Pneumovax: {Blank single:19197::"Up to Date","Not up to Date","unknown"} Influenza: {Blank single:19197::"Up to Date","Not up to Date","unknown"}   DEPRESSION Continues on Sertraline and Buspar daily. Mood status: {Blank single:19197::"controlled","uncontrolled","better","worse","exacerbated","stable"} Satisfied with current treatment?: {Blank single:19197::"yes","no"} Symptom severity: {Blank single:19197::"mild","moderate","severe"}  Duration of current treatment : {Blank single:19197::"chronic","months","years"} Side effects: {Blank single:19197::"yes","no"} Medication compliance: {Blank single:19197::"excellent compliance","good compliance","fair compliance","poor compliance"} Psychotherapy/counseling: {Blank single:19197::"yes","no"} {Blank single:19197::"current","in the past"} Depressed mood: {Blank single:19197::"yes","no"} Anxious mood: {Blank single:19197::"yes","no"} Anhedonia: {Blank single:19197::"yes","no"} Significant weight loss or gain: {Blank single:19197::"yes","no"} Insomnia: {Blank single:19197::"yes","no"} {Blank single:19197::"hard to fall asleep","hard to stay asleep"} Fatigue: {Blank single:19197::"yes","no"} Feelings of worthlessness or guilt: {Blank  single:19197::"yes","no"} Impaired concentration/indecisiveness: {Blank single:19197::"yes","no"} Suicidal ideations: {Blank single:19197::"yes","no"} Hopelessness: {Blank single:19197::"yes","no"} Crying spells: {Blank single:19197::"yes","no"}    06/20/2022    9:42 AM 12/21/2021    1:33 PM 11/15/2021   10:52 AM 11/08/2021   11:20 AM 11/13/2020    9:56 AM  Depression screen PHQ 2/9  Decreased Interest 1 1 0 0 0  Down, Depressed, Hopeless 1 1 0 0 0  PHQ - 2 Score 2 2 0 0 0  Altered sleeping _0 Tired, decreased energy _1 Change in appetite _2 Feeling bad or failure about yourself  _3 Trouble concentrating _4 Moving slowly or fidgety/restless 0 0 0    Suicidal thoughts 0 0 0    PHQ-9 Score _5 Difficult doing work/chores Not difficult at all            10/28/2019    8:20 AM 11/06/2020   10:43 AM 11/13/2020  9:55 AM 11/08/2021   11:21 AM 06/20/2022    9:41 AM  Fall Risk  Falls in the past year? 0 0 0 0 0  Was there an injury with Fall? 0   0 0  Fall Risk Category Calculator 0   0 0  Fall Risk Category Low   Low Low  Patient Fall Risk Level _0   Patient at Risk for Falls Due to     No Fall Risks  Fall risk Follow up  Falls evaluation completed;Education provided;Falls prevention discussed Falls evaluation completed Falls evaluation completed;Falls prevention discussed Falls evaluation completed    Past Medical History:  Past Medical History:  Diagnosis Date   Anxiety    Asthma    Lipoma    Neutropenia ([place])    Osteoporosis    Vitamin D deficiency disease     Surgical History:  Past Surgical History:  Procedure Laterality Date   CATARACT EXTRACTION Bilateral 03/2022    Medications:  Current Outpatient Medications on File Prior to Visit  Medication Sig   albuterol (VENTOLIN HFA) 108 (90 Base) MCG/ACT inhaler INHALE 2 PUFFS INTO THE LUNGS EVERY 4 HOURS AS NEEDED FOR  WHEEZING OR SHORTNESS OF BREATH   augmented betamethasone dipropionate (DIPROLENE AF) 0.05 % cream Apply topically 2 (two) times daily.   budesonide-formoterol (SYMBICORT) 160-4.5 MCG/ACT inhaler Inhale 2 puffs into the lungs 2 (two) times daily.   busPIRone (BUSPAR) 5 MG tablet Take 1 tablet (5 mg total) by mouth 2 (two) times daily as needed.   loratadine (CLARITIN) 10 MG tablet Take 10 mg by mouth daily.   montelukast (SINGULAIR) 10 MG tablet Take 1 tablet (10 mg total) by mouth at bedtime.   nystatin cream (MYCOSTATIN) Apply 1 application topically 2 (two) times daily.   rosuvastatin (CRESTOR) 20 MG tablet Take 1 tablet (20 mg total) by mouth daily.   sertraline (ZOLOFT) 100 MG tablet Take 1 tablet (100 mg total) by mouth daily.   Vitamin D, Ergocalciferol, 50 MCG (2000 UT) CAPS Take 2,000 Units by mouth. 1 x day   No current facility-administered medications on file prior to visit.    Allergies:  No Known Allergies  Social History:  Social History   Socioeconomic History   Marital status: Divorced    Spouse name: Not on file   Number of children: 3   Years of education: Not on file   Highest education level: Not on file  Occupational History   Not on file  Tobacco Use   Smoking status: Former    Packs/day: 1.00    Years: 20.00    Total pack years: 20.00    Types: Cigarettes    Quit date: 06/17/1985    Years since quitting: 37.0   Smokeless tobacco: Never  Vaping Use   Vaping Use: Never used  Substance and Sexual Activity   Alcohol use: No   Drug use: No   Sexual activity: Not Currently  Other Topics Concern   Not on file  Social History Narrative   Not on file   Social Determinants of Health   Financial Resource Strain: Low Risk  (11/08/2021)   Overall Financial Resource Strain (CARDIA)    Difficulty of Paying Living Expenses: Not hard at all  Food Insecurity: No Food Insecurity (11/08/2021)   Hunger Vital Sign    Worried About Running Out of Food in the Last  Year: Never true  Ran Out of Food in the Last Year: Never true  Transportation Needs: No Transportation Needs (11/08/2021)   PRAPARE - Hydrologist (Medical): No    Lack of Transportation (Non-Medical): No  Physical Activity: Sufficiently Active (11/08/2021)   Exercise Vital Sign    Days of Exercise per Week: 4 days    Minutes of Exercise per Session: 40 min  Stress: No Stress Concern Present (11/08/2021)   [place]    Feeling of Stress : Not at all  Social Connections: Socially Isolated (11/08/2021)   Social Connection and Isolation Panel [NHANES]    Frequency of Communication with Friends and Family: More than three times a week    Frequency of Social Gatherings with Friends and Family: More than three times a week    Attends Religious Services: Never    Marine scientist or Organizations: No    Attends Archivist Meetings: Never    Marital Status: Widowed  Intimate Partner Violence: Not At Risk (11/08/2021)   Humiliation, Afraid, Rape, and Kick questionnaire    Fear of Current or Ex-Partner: No    Emotionally Abused: No    Physically Abused: No    Sexually Abused: No   Social History   Tobacco Use  Smoking Status Former   Packs/day: 1.00   Years: 20.00   Total pack years: 20.00   Types: Cigarettes   Quit date: 06/17/1985   Years since quitting: 37.0  Smokeless Tobacco Never   Social History   Substance and Sexual Activity  Alcohol Use No    Family History:  Family History  Problem Relation Age of Onset   Dementia Mother    Hypertension Mother    Osteoporosis Mother    Stroke Mother        mini stroke   Cancer Father        brain tumor and lung   Hypertension Father    Emphysema Father    COPD Neg Hx    Heart disease Neg Hx     Past medical history, surgical history, medications, allergies, family history and social history reviewed with patient  today and changes made to appropriate areas of the chart.   ROS All other ROS negative except what is listed above and in the HPI.      Objective:    BP 126/73   Pulse 60   Temp 98.8 F (37.1 C) (Oral)   Ht 5' (1.524 m)   Wt 141 lb 6.4 oz (64.1 kg)   SpO2 96%   BMI 27.62 kg/m   Wt Readings from Last 3 Encounters:  06/20/22 141 lb 6.4 oz (64.1 kg)  12/21/21 136 lb 3.2 oz (61.8 kg)  11/15/21 134 lb 6.4 oz (61 kg)    Physical Exam  Results for orders placed or performed in visit on 12/21/21  T4, free  Result Value Ref Range   Free T4 1.17 0.82 - 1.77 ng/dL  Comprehensive metabolic panel  Result Value Ref Range   Glucose 84 70 - 99 mg/dL   BUN 12 8 - 27 mg/dL   Creatinine, Ser 0.86 0.57 - 1.00 mg/dL   eGFR 70 >59 mL/min/1.73   BUN/Creatinine Ratio 14 12 - 28   Sodium 136 134 - 144 mmol/L   Potassium 4.0 3.5 - 5.2 mmol/L   Chloride 98 96 - 106 mmol/L   CO2 27 20 - 29 mmol/L   Calcium 9.6 8.7 -  10.3 mg/dL   Total Protein 6.8 6.0 - 8.5 g/dL   Albumin 4.4 3.7 - 4.7 g/dL   Globulin, Total 2.4 1.5 - 4.5 g/dL   Albumin/Globulin Ratio 1.8 1.2 - 2.2   Bilirubin Total 0.4 0.0 - 1.2 mg/dL   Alkaline Phosphatase 84 44 - 121 IU/L   AST 21 0 - 40 IU/L   ALT 14 0 - 32 IU/L  CBC with Differential/Platelet  Result Value Ref Range   WBC 5.4 3.4 - 10.8 x10E3/uL   RBC 4.01 3.77 - 5.28 x10E6/uL   Hemoglobin 13.2 11.1 - 15.9 g/dL   Hematocrit 38.5 34.0 - 46.6 %   MCV 96 79 - 97 fL   MCH 32.9 26.6 - 33.0 pg   MCHC 34.3 31.5 - 35.7 g/dL   RDW 11.7 11.7 - 15.4 %   Platelets 186 150 - 450 x10E3/uL   Neutrophils 69 Not Estab. %   Lymphs 22 Not Estab. %   Monocytes 7 Not Estab. %   Eos 1 Not Estab. %   Basos 1 Not Estab. %   Neutrophils Absolute 3.7 1.4 - 7.0 x10E3/uL   Lymphocytes Absolute 1.2 0.7 - 3.1 x10E3/uL   Monocytes Absolute 0.4 0.1 - 0.9 x10E3/uL   EOS (ABSOLUTE) 0.1 0.0 - 0.4 x10E3/uL   Basophils Absolute 0.0 0.0 - 0.2 x10E3/uL   Immature Granulocytes 0 Not Estab. %    Immature Grans (Abs) 0.0 0.0 - 0.1 x10E3/uL  Lipid Panel w/o Chol/HDL Ratio  Result Value Ref Range   Cholesterol, Total 211 (H) 100 - 199 mg/dL   Triglycerides 74 0 - 149 mg/dL   HDL 81 >39 mg/dL   VLDL Cholesterol Cal 13 5 - 40 mg/dL   LDL Chol Calc (NIH) 117 (H) 0 - 99 mg/dL  TSH  Result Value Ref Range   TSH 1.920 0.450 - 4.500 uIU/mL  VITAMIN D 25 Hydroxy (Vit-D Deficiency, Fractures)  Result Value Ref Range   Vit D, 25-Hydroxy 37.6 30.0 - 100.0 ng/mL  Microalbumin, Urine Waived  Result Value Ref Range   Microalb, Ur Waived 10 0 - 19 mg/L   Creatinine, Urine Waived 50 10 - 300 mg/dL   Microalb/Creat Ratio <30 <30 mg/g      Assessment & Plan:   Problem List Items Addressed This Visit       Cardiovascular and Mediastinum   Essential hypertension, benign   Relevant Orders   CBC with Differential/Platelet   Comprehensive metabolic panel     Respiratory   COPD (chronic obstructive pulmonary disease) (HCC) - Primary   Relevant Orders   Spirometry with Graph     Musculoskeletal and Integument   Osteopenia of neck of left femur   Relevant Orders   VITAMIN D 25 Hydroxy (Vit-D Deficiency, Fractures)     Other   Anxiety   Depression, recurrent (HCC)   Elevated TSH   Relevant Orders   T4, free   Thyroid peroxidase antibody   TSH   Hyperlipidemia   Relevant Orders   Lipid Panel w/o Chol/HDL Ratio   Comprehensive metabolic panel   Vitamin D deficiency disease   Relevant Orders   VITAMIN D 25 Hydroxy (Vit-D Deficiency, Fractures)   Other Visit Diagnoses     Encounter for annual physical exam       Annual physical with labs today and health maintenance reviewed with patient.        Follow up plan: No follow-ups on file.   LABORATORY TESTING:  - Pap  smear: {Blank HAMNSY:57907::"TVP done","not applicable","up to date","done elsewhere"}  IMMUNIZATIONS:   - Tdap: Tetanus vaccination status reviewed: {tetanus status:315746}. - Influenza: {Blank  single:19197::"Up to date","Administered today","Postponed to flu season","Refused","Given elsewhere"} - Pneumovax: {Blank single:19197::"Up to date","Administered today","Not applicable","Refused","Given elsewhere"} - Prevnar: {Blank single:19197::"Up to date","Administered today","Not applicable","Refused","Given elsewhere"} - COVID: {Blank single:19197::"Up to date","Administered today","Not applicable","Refused","Given elsewhere"} - HPV: {Blank single:19197::"Up to date","Administered today","Not applicable","Refused","Given elsewhere"} - Shingrix vaccine: {Blank single:19197::"Up to date","Administered today","Not applicable","Refused","Given elsewhere"}  SCREENING: -Mammogram: {Blank single:19197::"Up to date","Ordered today","Not applicable","Refused","Done elsewhere"}  - Colonoscopy: {Blank single:19197::"Up to date","Ordered today","Not applicable","Refused","Done elsewhere"}  - Bone Density: {Blank single:19197::"Up to date","Ordered today","Not applicable","Refused","Done elsewhere"}  -Hearing Test: {Blank single:19197::"Up to date","Ordered today","Not applicable","Refused","Done elsewhere"}  -Spirometry: {Blank single:19197::"Up to date","Ordered today","Not applicable","Refused","Done elsewhere"}   PATIENT COUNSELING:   Advised to take 1 mg of folate supplement per day if capable of pregnancy.   Sexuality: Discussed sexually transmitted diseases, partner selection, use of condoms, avoidance of unintended pregnancy  and contraceptive alternatives.   Advised to avoid cigarette smoking.  I discussed with the patient that most people either abstain from alcohol or drink within safe limits (<=14/week and <=4 drinks/occasion for males, <=7/weeks and <= 3 drinks/occasion for females) and that the risk for alcohol disorders and other health effects rises proportionally with the number of drinks per week and how often a drinker exceeds daily limits.  Discussed cessation/primary prevention  of drug use and availability of treatment for abuse.   Diet: Encouraged to adjust caloric intake to maintain  or achieve ideal body weight, to reduce intake of dietary saturated fat and total fat, to limit sodium intake by avoiding high sodium foods and not adding table salt, and to maintain adequate dietary potassium and calcium preferably from fresh fruits, vegetables, and low-fat dairy products.    Stressed the importance of regular exercise  Injury prevention: Discussed safety belts, safety helmets, smoke detector, smoking near bedding or upholstery.   Dental health: Discussed importance of regular tooth brushing, flossing, and dental visits.    NEXT PREVENTATIVE PHYSICAL DUE IN 1 YEAR. No follow-ups on file.

## 2022-06-20 NOTE — Assessment & Plan Note (Addendum)
Chronic, ongoing. Spirometry today FEV1 52% FEV1/FVC 113%. She has been on Symbicort and Albuterol. Will switch Symbicort to Stiolto and continue Albuterol as needed. Continue Singulair and Claritin for allergies as she is tolerating well. CBC today. Return in 8 weeks.

## 2022-06-20 NOTE — Assessment & Plan Note (Addendum)
Chronic, stable.  Will continue Sertraline and Buspar at this time.  Buspar safer alternative to take for increased anxiety.  Denies SI/HI.

## 2022-06-20 NOTE — Assessment & Plan Note (Signed)
Chronic, ongoing.  Check level today and continue supplement.

## 2022-06-21 ENCOUNTER — Encounter: Payer: Self-pay | Admitting: Nurse Practitioner

## 2022-06-21 LAB — COMPREHENSIVE METABOLIC PANEL
ALT: 12 IU/L (ref 0–32)
AST: 18 IU/L (ref 0–40)
Albumin/Globulin Ratio: 1.9 (ref 1.2–2.2)
Albumin: 4.4 g/dL (ref 3.8–4.8)
Alkaline Phosphatase: 79 IU/L (ref 44–121)
BUN/Creatinine Ratio: 8 — ABNORMAL LOW (ref 12–28)
BUN: 8 mg/dL (ref 8–27)
Bilirubin Total: 0.6 mg/dL (ref 0.0–1.2)
CO2: 25 mmol/L (ref 20–29)
Calcium: 9.1 mg/dL (ref 8.7–10.3)
Chloride: 99 mmol/L (ref 96–106)
Creatinine, Ser: 0.96 mg/dL (ref 0.57–1.00)
Globulin, Total: 2.3 g/dL (ref 1.5–4.5)
Glucose: 79 mg/dL (ref 70–99)
Potassium: 3.8 mmol/L (ref 3.5–5.2)
Sodium: 137 mmol/L (ref 134–144)
Total Protein: 6.7 g/dL (ref 6.0–8.5)
eGFR: 61 mL/min/{1.73_m2} (ref >59)

## 2022-06-21 LAB — LIPID PANEL W/O CHOL/HDL RATIO
Cholesterol, Total: 173 mg/dL (ref 100–199)
HDL: 78 mg/dL (ref >39)
LDL Chol Calc (NIH): 84 mg/dL (ref 0–99)
Triglycerides: 58 mg/dL (ref 0–149)
VLDL Cholesterol Cal: 11 mg/dL (ref 5–40)

## 2022-06-21 LAB — CBC WITH DIFFERENTIAL/PLATELET
Basophils Absolute: 0 10*3/uL (ref 0.0–0.2)
Basos: 0 %
EOS (ABSOLUTE): 0.1 10*3/uL (ref 0.0–0.4)
Eos: 3 %
Hematocrit: 39 % (ref 34.0–46.6)
Hemoglobin: 12.4 g/dL (ref 11.1–15.9)
Immature Grans (Abs): 0 10*3/uL (ref 0.0–0.1)
Immature Granulocytes: 0 %
Lymphocytes Absolute: 1 10*3/uL (ref 0.7–3.1)
Lymphs: 23 %
MCH: 31.2 pg (ref 26.6–33.0)
MCHC: 31.8 g/dL (ref 31.5–35.7)
MCV: 98 fL — ABNORMAL HIGH (ref 79–97)
Monocytes Absolute: 0.4 10*3/uL (ref 0.1–0.9)
Monocytes: 8 %
Neutrophils Absolute: 2.9 10*3/uL (ref 1.4–7.0)
Neutrophils: 66 %
Platelets: 168 10*3/uL (ref 150–450)
RBC: 3.98 x10E6/uL (ref 3.77–5.28)
RDW: 11.5 % — ABNORMAL LOW (ref 11.7–15.4)
WBC: 4.5 10*3/uL (ref 3.4–10.8)

## 2022-06-21 LAB — VITAMIN D 25 HYDROXY (VIT D DEFICIENCY, FRACTURES): Vit D, 25-Hydroxy: 43.8 ng/mL (ref 30.0–100.0)

## 2022-06-21 LAB — THYROID PEROXIDASE ANTIBODY: Thyroperoxidase Ab SerPl-aCnc: 13 IU/mL (ref 0–34)

## 2022-06-21 LAB — T4, FREE: Free T4: 1.1 ng/dL (ref 0.82–1.77)

## 2022-06-21 LAB — TSH: TSH: 2.14 u[IU]/mL (ref 0.450–4.500)

## 2022-06-21 NOTE — Progress Notes (Signed)
Contacted via MyChart   Good evening Sherea, your labs have returned: - CBC shows no anemia or infection. - Kidney function, creatinine and eGFR, remains normal, as is liver function, AST and ALT.   -Thyroid labs remain stable at this time, with no further elevation. - Cholesterol labs are at goal, continue Rosuvastatin.   Overall labs look great, any questions? Keep being amazing!!  Thank you for allowing me to participate in your care.  I appreciate you. Kindest regards, Caswell Alvillar

## 2022-07-27 ENCOUNTER — Other Ambulatory Visit: Payer: Self-pay | Admitting: Nurse Practitioner

## 2022-07-28 NOTE — Telephone Encounter (Signed)
Requested Prescriptions  Pending Prescriptions Disp Refills  . montelukast (SINGULAIR) 10 MG tablet [Pharmacy Med Name: MONTELUKAST '10MG'$  TABLETS] 90 tablet 0    Sig: TAKE 1 TABLET(10 MG) BY MOUTH AT BEDTIME     Pulmonology:  Leukotriene Inhibitors Passed - 07/27/2022  4:17 PM      Passed - Valid encounter within last 12 months    Recent Outpatient Visits          1 month ago Simple chronic bronchitis ([place])   [place], Jolene T, NP   7 months ago Simple chronic bronchitis ([place])   [place], Bude T, NP   8 months ago COPD exacerbation ([place])   [place], Megan P, DO   1 year ago Simple chronic bronchitis ([place])   [place], Henrine Screws T, NP   2 years ago Essential hypertension, benign   [place], Barbaraann Faster, NP      Future Appointments            In 2 weeks [place], Barbaraann Faster, NP [facility]

## 2022-08-14 NOTE — Patient Instructions (Signed)
Living with COPD Being diagnosed with chronic obstructive pulmonary disease (COPD) changes your life physically and emotionally. Having COPD can affect your ability to work and do things you enjoy. COPD is not the same for everyone, and it may change over time. Your health care providers can help you come up with the COPD management plan that works best for you. How to manage lifestyle changes Treatment plan Work closely with your health care providers. Follow your COPD management plan. This plan includes: Instructions about activities, exercises, diet, medicines, what to do when COPD flares up, and when to call your health care provider. A pulmonary rehabilitation program. In pulmonary rehab, you will learn about COPD, do exercises for fitness and breathing, and get support from health care providers and other people who have COPD. Managing emotions and stress Living with a chronic disease means you may also struggle with stressful emotions, such as sadness, fear, and worry. Here are some ways to manage these emotions: Talk to someone about your fear, anxiety, depression, or stress. Learn strategies to avoid or reduce stress and ask for help if you are struggling with depression or anxiety. Consider joining a COPD support group, online or in person.  Adjusting to changes COPD may limit the things you can do, but you can make certain changes to help you cope with the diagnosis. Ask for help when you need it. Getting support from friends, family, and your health care team is an important part of managing the condition. Try to get regular exercise as prescribed by a health care provider or pulmonary rehab team. Exercising can help COPD, even if you are a bit short of breath. Take steps to prevent infection and protect your lungs: Wash your hands often and avoid being in crowds. Stay away from friends and family members who are sick. Check your local air quality each day, and stay out of areas  where air pollution is likely. How to recognize changes in your condition Recognizing changes in your COPD COPD is a progressive disease. It is important to let the health care team know if your COPD is getting worse. Your treatment plan may need to change. Watch for: Increased shortness of breath, wheezing, cough, or fatigue. Loss of ability to exercise or perform daily activities, like climbing stairs. More frequent symptom flares. Signs of depression or anxiety. Recognizing stress It is normal to have additional stress when you have COPD. However, prolonged stress and anxiety can make COPD worse and lead to depression. Recognize the warning signs, which include: Feeling sad or worried more often or most of the time. Having less energy and losing interest in pleasurable activities. Changes in your appetite or sleeping patterns. Being easily angered or irritated. Having unexplained aches and pains, digestive problems, or headaches. Follow these instructions at home: Eating and drinking  Eat foods that are high in fiber, such as fresh fruits and vegetables, whole grains, and beans. Limit foods that are high in fat and processed sugars, such as fried or sweet foods. Follow a balanced diet and maintain a healthy weight. Being overweight or underweight can make COPD worse. You may work with a dietitian as part of your pulmonary rehab program. Drink enough fluid to keep your urine pale yellow. If you drink alcohol: Limit how much you have to: 0-1 drink a day for women who are not pregnant. 0-2 drinks a day for men. Know how much alcohol is in your drink. In the [place] one drink equals one 12 oz bottle   of beer (355 mL), one 5 oz glass of wine (148 mL), or one 1 oz glass of hard liquor (44 mL). Lifestyle If you smoke, the most important thing that you can do is to stop smoking. Continuing to smoke will cause the disease to progress faster. Do not use any products that contain nicotine or  tobacco. These products include cigarettes, chewing tobacco, and vaping devices, such as e-cigarettes. If you need help quitting, ask your health care provider. Avoid exposure to things that irritate your lungs, such as smoke, chemicals, and fumes. Activity Balance exercise and rest. Take short walks every 1-2 hours. This is important to improve blood flow and breathing. Ask for help if you feel weak or unsteady. Do exercises that include controlled breathing with body movement, such as tai chi. General instructions Take over-the-counter and prescription medicines only as told by your health care provider. Take vitamin and protein supplements as told by your health care provider or dietitian. Practice good oral hygiene and see your dental care provider regularly. An oral infection can also spread to your lungs. Make sure you receive all the vaccines that your health care provider recommends. Keep all follow-up visits. This is important. Contact a health care provider if you: Are struggling to manage your COPD. Have emotional stress that interferes with your ability to cope with COPD. Get help right away if you: Have thoughts of suicide, death, or hurting yourself or others. If you ever feel like you may hurt yourself or others, or have thoughts about taking your own life, get help right away. Go to your nearest emergency department or: Call your local emergency services (911 in the [place] Call a suicide crisis helpline, such as the National Suicide Prevention Lifeline at 1-800-273-8255 or 988 in the [place] This is open 24 hours a day in the [place] Text the Crisis Text Line at 741741 (in the [place] Summary Being diagnosed with chronic obstructive pulmonary disease (COPD) changes your life physically and emotionally. Work with your health care providers and follow your COPD management plan. A pulmonary rehabilitation program is an important part of COPD management. Prolonged stress, anxiety, and  depression can make COPD worse. Let your health care provider know if emotional stress interferes with your ability to cope with and manage COPD. This information is not intended to replace advice given to you by your health care provider. Make sure you discuss any questions you have with your health care provider. Document Revised: 06/16/2021 Document Reviewed: 12/09/2020 Elsevier Patient Education  2023 [employer] Inc.  

## 2022-08-16 ENCOUNTER — Encounter: Payer: Self-pay | Admitting: Nurse Practitioner

## 2022-08-16 ENCOUNTER — Ambulatory Visit: Payer: Medicare PPO | Admitting: Nurse Practitioner

## 2022-08-16 DIAGNOSIS — J410 Simple chronic bronchitis: Secondary | ICD-10-CM

## 2022-08-16 MED ORDER — MONTELUKAST SODIUM 10 MG PO TABS: ORAL_TABLET | ORAL | 4 refills | Status: DC

## 2022-08-16 MED ORDER — ROSUVASTATIN CALCIUM 20 MG PO TABS: 20.0000 mg | ORAL_TABLET | Freq: Every day | ORAL | 4 refills | Status: DC

## 2022-08-16 NOTE — Progress Notes (Signed)
BP 132/72   Pulse 71   Temp 98.1 F (36.7 C) (Oral)   Ht 5' (1.524 m)   Wt 142 lb 14.4 oz (64.8 kg)   SpO2 96%   BMI 27.91 kg/m    Subjective:    Patient ID: Shelly Bridges, female    DOB: May 27, 1946, 76 y.o.   MRN: [id number]  HPI: Shelly Bridges is a 76 y.o. female  Chief Complaint  Patient presents with   COPD    Patient is here for follow up on COPD with change of medication. Patient says everything is going well.    COPD Changed her to [employer] and Singulair on 06/20/22 -- she notices a big difference, even mowed her whole lawn without difficulty.  Using Albuterol less, not even weekly. COPD status: controlled Satisfied with current treatment?: yes Oxygen use: no Dyspnea frequency: no Cough frequency: a little bit, improved Rescue inhaler frequency:  minimal use Limitation of activity: no Productive cough: none Last Spirometry: 06/20/22 Pneumovax: Up to Date Influenza: Up to Date   Relevant past medical, surgical, family and social history reviewed and updated as indicated. Interim medical history since our last visit reviewed. Allergies and medications reviewed and updated.  Review of Systems  Constitutional:  Negative for activity change, appetite change, diaphoresis, fatigue and fever.  Respiratory:  Negative for cough, chest tightness and shortness of breath.   Cardiovascular:  Negative for chest pain, palpitations and leg swelling.  Gastrointestinal: Negative.   Neurological: Negative.   Psychiatric/Behavioral: Negative.      Per HPI unless specifically indicated above     Objective:    BP 132/72   Pulse 71   Temp 98.1 F (36.7 C) (Oral)   Ht 5' (1.524 m)   Wt 142 lb 14.4 oz (64.8 kg)   SpO2 96%   BMI 27.91 kg/m   Wt Readings from Last 3 Encounters:  08/16/22 142 lb 14.4 oz (64.8 kg)  06/20/22 141 lb 6.4 oz (64.1 kg)  12/21/21 136 lb 3.2 oz (61.8 kg)    Physical Exam Vitals and nursing note reviewed.  Constitutional:       General: She is awake. She is not in acute distress.    Appearance: She is well-developed and well-groomed. She is not ill-appearing or toxic-appearing.  HENT:     Head: Normocephalic.     Right Ear: Hearing normal.     Left Ear: Hearing normal.     Nose: Nose normal.  Eyes:     General: Lids are normal.        Right eye: No discharge.        Left eye: No discharge.     Conjunctiva/sclera: Conjunctivae normal.     Pupils: Pupils are equal, round, and reactive to light.  Neck:     Thyroid: No thyromegaly.     Vascular: No carotid bruit.  Cardiovascular:     Rate and Rhythm: Normal rate and regular rhythm.     Heart sounds: Normal heart sounds. No murmur heard.    No gallop.  Pulmonary:     Effort: Pulmonary effort is normal. No accessory muscle usage or respiratory distress.     Breath sounds: Normal breath sounds.  Abdominal:     General: Bowel sounds are normal.     Palpations: Abdomen is soft.  Musculoskeletal:     Cervical back: Normal range of motion and neck supple.     Right lower leg: No edema.     Left lower  leg: No edema.  Lymphadenopathy:     Cervical: No cervical adenopathy.  Skin:    General: Skin is warm and dry.  Neurological:     Mental Status: She is alert and oriented to person, place, and time.  Psychiatric:        Attention and Perception: Attention normal.        Mood and Affect: Mood normal.        Speech: Speech normal.        Behavior: Behavior normal. Behavior is cooperative.        Thought Content: Thought content normal.    Results for orders placed or performed in visit on 06/20/22  Lipid Panel w/o Chol/HDL Ratio  Result Value Ref Range   Cholesterol, Total 173 100 - 199 mg/dL   Triglycerides 58 0 - 149 mg/dL   HDL 78 >39 mg/dL   VLDL Cholesterol Cal 11 5 - 40 mg/dL   LDL Chol Calc (NIH) 84 0 - 99 mg/dL  T4, free  Result Value Ref Range   Free T4 1.10 0.82 - 1.77 ng/dL  Thyroid peroxidase antibody  Result Value Ref Range    Thyroperoxidase Ab SerPl-aCnc 13 0 - 34 IU/mL  TSH  Result Value Ref Range   TSH 2.140 0.450 - 4.500 uIU/mL  CBC with Differential/Platelet  Result Value Ref Range   WBC 4.5 3.4 - 10.8 x10E3/uL   RBC 3.98 3.77 - 5.28 x10E6/uL   Hemoglobin 12.4 11.1 - 15.9 g/dL   Hematocrit 39.0 34.0 - 46.6 %   MCV 98 (H) 79 - 97 fL   MCH 31.2 26.6 - 33.0 pg   MCHC 31.8 31.5 - 35.7 g/dL   RDW 11.5 (L) 11.7 - 15.4 %   Platelets 168 150 - 450 x10E3/uL   Neutrophils 66 Not Estab. %   Lymphs 23 Not Estab. %   Monocytes 8 Not Estab. %   Eos 3 Not Estab. %   Basos 0 Not Estab. %   Neutrophils Absolute 2.9 1.4 - 7.0 x10E3/uL   Lymphocytes Absolute 1.0 0.7 - 3.1 x10E3/uL   Monocytes Absolute 0.4 0.1 - 0.9 x10E3/uL   EOS (ABSOLUTE) 0.1 0.0 - 0.4 x10E3/uL   Basophils Absolute 0.0 0.0 - 0.2 x10E3/uL   Immature Granulocytes 0 Not Estab. %   Immature Grans (Abs) 0.0 0.0 - 0.1 x10E3/uL  Comprehensive metabolic panel  Result Value Ref Range   Glucose 79 70 - 99 mg/dL   BUN 8 8 - 27 mg/dL   Creatinine, Ser 0.96 0.57 - 1.00 mg/dL   eGFR 61 >59 mL/min/1.73   BUN/Creatinine Ratio 8 (L) 12 - 28   Sodium 137 134 - 144 mmol/L   Potassium 3.8 3.5 - 5.2 mmol/L   Chloride 99 96 - 106 mmol/L   CO2 25 20 - 29 mmol/L   Calcium 9.1 8.7 - 10.3 mg/dL   Total Protein 6.7 6.0 - 8.5 g/dL   Albumin 4.4 3.8 - 4.8 g/dL   Globulin, Total 2.3 1.5 - 4.5 g/dL   Albumin/Globulin Ratio 1.9 1.2 - 2.2   Bilirubin Total 0.6 0.0 - 1.2 mg/dL   Alkaline Phosphatase 79 44 - 121 IU/L   AST 18 0 - 40 IU/L   ALT 12 0 - 32 IU/L  VITAMIN D 25 Hydroxy (Vit-D Deficiency, Fractures)  Result Value Ref Range   Vit D, 25-Hydroxy 43.8 30.0 - 100.0 ng/mL      Assessment & Plan:   Problem List Items Addressed This Visit  Respiratory   COPD (chronic obstructive pulmonary disease) (HCC)    Chronic, ongoing. Spirometry July 2023 = FEV1 52% FEV1/FVC 113%. Tolerating change to Stiolto with improved symptoms -- continue this and Albuterol  as needed.  Continue Singulair and Claritin for allergies as she is tolerating well.  Return in 5 months.      Relevant Medications   montelukast (SINGULAIR) 10 MG tablet     Follow up plan: Return in about 5 months (around 01/16/2023) for COPD, HTN/HLD, MOOD, OSTEOPENIA.

## 2022-08-16 NOTE — Assessment & Plan Note (Addendum)
Chronic, ongoing. Spirometry July 2023 = FEV1 52% FEV1/FVC 113%. Tolerating change to Stiolto with improved symptoms -- continue this and Albuterol as needed.  Continue Singulair and Claritin for allergies as she is tolerating well.  Return in 5 months.

## 2022-12-14 ENCOUNTER — Telehealth: Payer: Self-pay | Admitting: Nurse Practitioner

## 2022-12-14 NOTE — Telephone Encounter (Signed)
Copied from [place] [id number]. Topic: Medicare AWV >> Dec 14, 2022  1:21 PM Josephina Gip wrote: Reason for CRM: Left message for patient to call back and schedule the Medicare Annual Wellness Visit (AWV) virtually or by telephone.  Last AWV 11/08/21  Please schedule at anytime with [facility]. Preferably 01/16/23 since patient is already scheduled with PCP that day.   Any questions, please call me at [phone]

## 2023-01-15 NOTE — Patient Instructions (Signed)
Eating Plan for Chronic Obstructive Pulmonary Disease Chronic obstructive pulmonary disease (COPD) causes symptoms such as shortness of breath, coughing, and chest discomfort. These symptoms can make it difficult to eat enough to maintain a healthy weight. Generally, people with COPD should eat a diet that is high in calories, protein, and other nutrients to maintain body weight and to keep the lungs as healthy as possible. Depending on the medicines you take and other health conditions you may have, your health care provider may give you additional recommendations on what to eat or avoid. Talk with your health care provider about your goals for body weight, and work with a dietitian to develop an eating plan that is right for you. What are tips for following this plan? Reading food labels  Avoid foods with more than 300 milligrams (mg) of salt (sodium) per serving. Choose foods that contain at least 4 grams (g) of fiber per serving. Try to eat 20-30 g of fiber each day. Choose foods that are high in calories and protein, such as nuts, beans, yogurt, and cheese. Shopping Do not buy foods labeled as diet, low-calorie, or low-fat. If you are able to eat dairy products: Avoid low-fat or skim milk. Buy dairy products that have at least 2% fat. Buy nutritional supplement drinks. Buy grains and prepared foods labeled as enriched or fortified. Consider buying low-sodium, pre-made foods to conserve energy for eating. Cooking Add dry milk or protein powder to smoothies. Cook with healthy fats, such as olive oil, canola oil, sunflower oil, and grapeseed oil. Add oil, butter, cream cheese, or nut butters to foods to increase fat and calories. To make foods easier to chew and swallow: Cook vegetables, pasta, and rice until soft. Cut or grind meat into very small pieces. Dip breads in liquid. Meal planning  Eat when you feel hungry. Eat 5-6 small meals throughout the day. Drink 6-8 glasses of water  each day. Do not drink liquids with meals. Drink liquids at the end of the meal to avoid feeling full too quickly. Eat a variety of fruits and vegetables every day. Ask for assistance from family or friends with planning and preparing meals as needed. Avoid foods that cause you to feel bloated, such as carbonated drinks, fried foods, beans, broccoli, cabbage, and apples. For older adults, ask your local agency on aging whether you are eligible for meal assistance programs, such as Meals on Wheels. Lifestyle  Do not smoke. Eat slowly. Take small bites and chew food well before swallowing. Do not overeat. This may make it more difficult to breathe after eating. Sit up while eating. If needed, continue to use supplemental oxygen while eating. Rest or relax for 30 minutes before and after eating. Monitor your weight as told by your health care provider. Exercise as told by your health care provider. What foods should I eat? Fruits All fresh, dried, canned, or frozen fruits that do not cause gas. Vegetables All fresh, canned (no salt added), or frozen vegetables that do not cause gas. Grains Whole-grain bread. Enriched whole-grain pasta. Fortified whole-grain cereals. Fortified rice. Quinoa. Meats and other proteins Lean meat. Poultry. Fish. Dried beans. Unsalted nuts. Tofu. Eggs. Nut butters. Dairy Whole or 2% milk. Cheese. Yogurt. Fats and oils Olive oil. Canola oil. Butter. Margarine. Beverages Water. Vegetable juice (no salt added). Decaffeinated coffee. Decaffeinated or herbal tea. Seasonings and condiments Fresh or dried herbs. Low-salt or salt-free seasonings. Low-sodium soy sauce. The items listed above may not be a complete list of foods  and beverages you can eat. Contact a dietitian for more information. What foods should I avoid? Fruits Fruits that cause gas, such as apples or melon. Vegetables Vegetables that cause gas, such as broccoli, Brussels sprouts, cabbage,  cauliflower, and onions. Canned vegetables with added salt. Meats and other proteins Fried meat. Salt-cured meat. Processed meat. Dairy Fat-free or low-fat milk, yogurt, or cheese. Processed cheese. Beverages Carbonated drinks. Caffeinated drinks, such as coffee, tea, and soft drinks. Juice. Alcohol. Vegetable juice with added salt. Seasonings and condiments Salt. Seasoning mixes with salt. Soy sauce. Angie Fava. Other foods Clear soup or broth. Fried foods. Prepared frozen meals. The items listed above may not be a complete list of foods and beverages you should avoid. Contact a dietitian for more information. Summary COPD symptoms can make it difficult to eat enough to maintain a healthy weight. A COPD eating plan can help you maintain your body weight and keep your lungs as healthy as possible. Eat a diet that is high in calories, protein, and other nutrients. Read labels to make sure that you are getting the right nutrients. Cook foods to make them easier to chew and swallow. Eat 5-6 small meals throughout the day, and avoid foods that cause gas or make you feel bloated. This information is not intended to replace advice given to you by your health care provider. Make sure you discuss any questions you have with your health care provider. Document Revised: 09/29/2020 Document Reviewed: 09/29/2020 Elsevier Patient Education  [place].

## 2023-01-16 ENCOUNTER — Ambulatory Visit: Payer: Medicare PPO | Admitting: Nurse Practitioner

## 2023-01-16 ENCOUNTER — Encounter: Payer: Self-pay | Admitting: Nurse Practitioner

## 2023-01-16 VITALS — BP 138/78 | HR 65 | Temp 98.2°F | Ht 60.0 in | Wt 123.8 lb

## 2023-01-16 DIAGNOSIS — J410 Simple chronic bronchitis: Secondary | ICD-10-CM

## 2023-01-16 DIAGNOSIS — F339 Major depressive disorder, recurrent, unspecified: Secondary | ICD-10-CM

## 2023-01-16 DIAGNOSIS — Z23 Encounter for immunization: Secondary | ICD-10-CM | POA: Diagnosis not present

## 2023-01-16 DIAGNOSIS — F419 Anxiety disorder, unspecified: Secondary | ICD-10-CM | POA: Diagnosis not present

## 2023-01-16 DIAGNOSIS — E7801 Familial hypercholesterolemia: Secondary | ICD-10-CM

## 2023-01-16 DIAGNOSIS — I10 Essential (primary) hypertension: Secondary | ICD-10-CM | POA: Diagnosis not present

## 2023-01-16 DIAGNOSIS — M85852 Other specified disorders of bone density and structure, left thigh: Secondary | ICD-10-CM

## 2023-01-16 MED ORDER — BUSPIRONE HCL 5 MG PO TABS: 5.0000 mg | ORAL_TABLET | Freq: Two times a day (BID) | ORAL | 4 refills | Status: DC | PRN

## 2023-01-16 MED ORDER — SERTRALINE HCL 100 MG PO TABS: 100.0000 mg | ORAL_TABLET | Freq: Every day | ORAL | 4 refills | Status: DC

## 2023-01-16 NOTE — Assessment & Plan Note (Signed)
Chronic, ongoing. Continue current medication regimen and adjust as needed.  Lipid panel today. 

## 2023-01-16 NOTE — Assessment & Plan Note (Signed)
Chronic, ongoing. Spirometry July 2023 = FEV1 52% FEV1/FVC 113%. Tolerating change to Stiolto with improved symptoms and decreased exacerbations -- continue this and Albuterol as needed.  Continue Singulair and Claritin for allergies as she is tolerating well.  Return in 6 months.

## 2023-01-16 NOTE — Assessment & Plan Note (Signed)
Refer to depression plan of care.

## 2023-01-16 NOTE — Assessment & Plan Note (Signed)
Ongoing noted on recent DEXA, some decline from previous with T-score -2.3 and previous in 2015 was -1.2.  Educated her on these findings and current treatment.  She is to take Vitamin D3 2000 units daily and intake adequate calcium daily + perform weight bearing exercises. Check Vit D level next visit and plan on repeat DEXA in 5 years, sooner if fracture presents.

## 2023-01-16 NOTE — Assessment & Plan Note (Signed)
Chronic, stable.  Will continue Sertraline and Buspar at this time.  Buspar safer alternative to take for increased anxiety.  Denies SI/HI.  Return to office in 6 months.

## 2023-01-16 NOTE — Assessment & Plan Note (Signed)
Chronic, stable.  BP at goal for her age on recheck.  Will continue diet focus and if needed in future restart HCTZ which has worked well in past, or add on ARB, avoid ACE due to COPD.  Labs today to include CMP.  Return in 6 months.

## 2023-01-16 NOTE — Progress Notes (Signed)
BP 138/78 (BP Location: Left Arm, Patient Position: Sitting, Cuff Size: Normal)   Pulse 65   Temp 98.2 F (36.8 C) (Oral)   Ht 5' (1.524 m)   Wt 123 lb 12.8 oz (56.2 kg)   BMI 24.18 kg/m    Subjective:    Patient ID: Shelly Bridges, female    DOB: February 28, 1946, 78 y.o.   MRN: [id number]  HPI: Shelly Bridges is a 77 y.o. female  Chief Complaint  Patient presents with   COPD   Hypertension   Hyperlipidemia   Mood   Osteopenia   HYPERTENSION Has been off blood pressure for > 1 year.  Rosuvastatin 10 MG daily for HLD.  History of elevations in TSH, recent two checks have been stable. Hypertension status: stable  Satisfied with current treatment? yes Duration of hypertension: chronic BP monitoring frequency: not checking BP range: not checking BP medication side effects:  no Medication compliance: good compliance Previous BP meds: HCTZ Aspirin: no Recurrent headaches: no Visual changes: no Palpitations: no Dyspnea: no Chest pain: no Lower extremity edema: no Dizzy/lightheaded: no The 10-year ASCVD risk score (Arnett DK, et al., 2019) is: 20.5%   Values used to calculate the score:     Age: 36 years     Sex: Female     Is Non-Hispanic African American: No     Diabetic: No     Tobacco smoker: No     Systolic Blood Pressure: 0000000 mmHg     Is BP treated: No     HDL Cholesterol: 78 mg/dL     Total Cholesterol: 173 mg/dL    COPD Continues on Stiolto daily and Albuterol as needed + Singulair. Past smoker, has not smoked for > 30 years. Smoked for about 20 years on and off.  Last exacerbation 11/23/21. COPD status: stable Satisfied with current treatment?: yes Oxygen use: no Dyspnea frequency: none Cough frequency: none Rescue inhaler frequency: not often -- once every few months Limitation of activity: no Productive cough: no Last Spirometry: 06/20/2022 Pneumovax: Up to Date Influenza: Up to Date   OSTEOPENIA DEXA on 12/20/21 == The BMD measured  at Femur Neck Left is 0.724 g/cm2 with a T-score of -2.3.  Satisfied with current treatment?: yes Medication compliance: good compliance Adequate calcium & vitamin D: yes Weight bearing exercises: yes   ANXIETY/STRESS Taking Sertraline 100 MG daily and Buspar 5 MG BID as needed.  Has lost 19 lbs since last visit, as her daughter's husband had cardiac arrest recently at 59 years old -- he is in [employer].  Shelly Bridges then went to help her daughter and watch children, they are at [facility] now -- was with them for 5 months caring for them -- which is how she lost weight.  Currently has returned to eating 3 meals a day. Duration:stable Anxious mood: no Excessive worrying: no Irritability: no  Sweating: no Nausea: no Palpitations:no Hyperventilation: no Panic attacks: no Agoraphobia: no  Obscessions/compulsions: no Depressed mood: no    01/16/2023    9:44 AM 08/16/2022   10:09 AM 06/20/2022    9:42 AM 12/21/2021    1:33 PM 11/15/2021   10:52 AM  Depression screen PHQ 2/9  Decreased Interest 1 1 1 1 $ 0  Down, Depressed, Hopeless 1 1 1 1 $ 0  PHQ - 2 Score 2 2 2 2 $ 0  Altered sleeping 1 1 1 2 1  $ Tired, decreased energy 1 1 1 1 1  $ Change in appetite 1 1 1 $ 1  1  Feeling bad or failure about yourself  1 1 1 1 1  $ Trouble concentrating 1 1 1 1 1  $ Moving slowly or fidgety/restless 1 0 0 0 0  Suicidal thoughts 0 0 0 0 0  PHQ-9 Score 8 7 7 8 5  $ Difficult doing work/chores Not difficult at all Not difficult at all Not difficult at all    Anhedonia: no Weight changes: no Insomnia: none Hypersomnia: no Fatigue/loss of energy: no Feelings of worthlessness: no Feelings of guilt: no Impaired concentration/indecisiveness: no Suicidal ideations: no  Crying spells: no Recent Stressors/Life Changes: no   Relationship problems: no   Family stress: no     Financial stress: no    Job stress: no    Recent death/loss: no    02-11-2023    9:45 AM 08/16/2022   10:09 AM 06/20/2022    9:42 AM 12/21/2021     1:35 PM  GAD 7 : Generalized Anxiety Score  Nervous, Anxious, on Edge 1 1 1 1  $ Control/stop worrying 1 1 1 1  $ Worry too much - different things 1 1 1 1  $ Trouble relaxing 1 1 1 1  $ Restless 1 1 1 1  $ Easily annoyed or irritable 1 1 1 $ 0  Afraid - awful might happen 1 1 1 1  $ Total GAD 7 Score 7 7 7 6  $ Anxiety Difficulty Not difficult at all Not difficult at all Not difficult at all Not difficult at all   Relevant past medical, surgical, family and social history reviewed and updated as indicated. Interim medical history since our last visit reviewed. Allergies and medications reviewed and updated.  Review of Systems  Constitutional:  Negative for activity change, appetite change, diaphoresis, fatigue and fever.  Respiratory:  Negative for cough, chest tightness and shortness of breath.   Cardiovascular:  Negative for chest pain, palpitations and leg swelling.  Gastrointestinal: Negative.   Neurological: Negative.   Psychiatric/Behavioral: Negative.      Per HPI unless specifically indicated above     Objective:    BP 138/78 (BP Location: Left Arm, Patient Position: Sitting, Cuff Size: Normal)   Pulse 65   Temp 98.2 F (36.8 C) (Oral)   Ht 5' (1.524 m)   Wt 123 lb 12.8 oz (56.2 kg)   BMI 24.18 kg/m   Wt Readings from Last 3 Encounters:  02/11/23 123 lb 12.8 oz (56.2 kg)  08/16/22 142 lb 14.4 oz (64.8 kg)  06/20/22 141 lb 6.4 oz (64.1 kg)    Physical Exam Vitals and nursing note reviewed.  Constitutional:      General: She is awake. She is not in acute distress.    Appearance: She is well-developed and well-groomed. She is not ill-appearing or toxic-appearing.  HENT:     Head: Normocephalic.     Right Ear: Hearing normal.     Left Ear: Hearing normal.  Eyes:     General: Lids are normal.        Right eye: No discharge.        Left eye: No discharge.     Conjunctiva/sclera: Conjunctivae normal.     Pupils: Pupils are equal, round, and reactive to light.  Neck:      Thyroid: No thyromegaly.     Vascular: No carotid bruit.  Cardiovascular:     Rate and Rhythm: Normal rate and regular rhythm.     Heart sounds: Normal heart sounds. No murmur heard.    No gallop.  Pulmonary:  Effort: Pulmonary effort is normal. No accessory muscle usage or respiratory distress.     Breath sounds: Normal breath sounds.  Abdominal:     General: Bowel sounds are normal.     Palpations: Abdomen is soft.  Musculoskeletal:     Cervical back: Normal range of motion and neck supple.     Right lower leg: No edema.     Left lower leg: No edema.  Lymphadenopathy:     Cervical: No cervical adenopathy.  Skin:    General: Skin is warm and dry.  Neurological:     Mental Status: She is alert and oriented to person, place, and time.     Deep Tendon Reflexes: Reflexes are normal and symmetric.     Reflex Scores:      Brachioradialis reflexes are 2+ on the right side and 2+ on the left side.      Patellar reflexes are 2+ on the right side and 2+ on the left side. Psychiatric:        Attention and Perception: Attention normal.        Mood and Affect: Mood normal.        Speech: Speech normal.        Behavior: Behavior normal. Behavior is cooperative.        Thought Content: Thought content normal.    Results for orders placed or performed in visit on 06/20/22  Lipid Panel w/o Chol/HDL Ratio  Result Value Ref Range   Cholesterol, Total 173 100 - 199 mg/dL   Triglycerides 58 0 - 149 mg/dL   HDL 78 >39 mg/dL   VLDL Cholesterol Cal 11 5 - 40 mg/dL   LDL Chol Calc (NIH) 84 0 - 99 mg/dL  T4, free  Result Value Ref Range   Free T4 1.10 0.82 - 1.77 ng/dL  Thyroid peroxidase antibody  Result Value Ref Range   Thyroperoxidase Ab SerPl-aCnc 13 0 - 34 IU/mL  TSH  Result Value Ref Range   TSH 2.140 0.450 - 4.500 uIU/mL  CBC with Differential/Platelet  Result Value Ref Range   WBC 4.5 3.4 - 10.8 x10E3/uL   RBC 3.98 3.77 - 5.28 x10E6/uL   Hemoglobin 12.4 11.1 - 15.9 g/dL    Hematocrit 39.0 34.0 - 46.6 %   MCV 98 (H) 79 - 97 fL   MCH 31.2 26.6 - 33.0 pg   MCHC 31.8 31.5 - 35.7 g/dL   RDW 11.5 (L) 11.7 - 15.4 %   Platelets 168 150 - 450 x10E3/uL   Neutrophils 66 Not Estab. %   Lymphs 23 Not Estab. %   Monocytes 8 Not Estab. %   Eos 3 Not Estab. %   Basos 0 Not Estab. %   Neutrophils Absolute 2.9 1.4 - 7.0 x10E3/uL   Lymphocytes Absolute 1.0 0.7 - 3.1 x10E3/uL   Monocytes Absolute 0.4 0.1 - 0.9 x10E3/uL   EOS (ABSOLUTE) 0.1 0.0 - 0.4 x10E3/uL   Basophils Absolute 0.0 0.0 - 0.2 x10E3/uL   Immature Granulocytes 0 Not Estab. %   Immature Grans (Abs) 0.0 0.0 - 0.1 x10E3/uL  Comprehensive metabolic panel  Result Value Ref Range   Glucose 79 70 - 99 mg/dL   BUN 8 8 - 27 mg/dL   Creatinine, Ser 0.96 0.57 - 1.00 mg/dL   eGFR 61 >59 mL/min/1.73   BUN/Creatinine Ratio 8 (L) 12 - 28   Sodium 137 134 - 144 mmol/L   Potassium 3.8 3.5 - 5.2 mmol/L   Chloride 99 96 -  106 mmol/L   CO2 25 20 - 29 mmol/L   Calcium 9.1 8.7 - 10.3 mg/dL   Total Protein 6.7 6.0 - 8.5 g/dL   Albumin 4.4 3.8 - 4.8 g/dL   Globulin, Total 2.3 1.5 - 4.5 g/dL   Albumin/Globulin Ratio 1.9 1.2 - 2.2   Bilirubin Total 0.6 0.0 - 1.2 mg/dL   Alkaline Phosphatase 79 44 - 121 IU/L   AST 18 0 - 40 IU/L   ALT 12 0 - 32 IU/L  VITAMIN D 25 Hydroxy (Vit-D Deficiency, Fractures)  Result Value Ref Range   Vit D, 25-Hydroxy 43.8 30.0 - 100.0 ng/mL      Assessment & Plan:   Problem List Items Addressed This Visit       Cardiovascular and Mediastinum   Essential hypertension, benign    Chronic, stable.  BP at goal for her age on recheck.  Will continue diet focus and if needed in future restart HCTZ which has worked well in past, or add on ARB, avoid ACE due to COPD.  Labs today to include CMP.  Return in 6 months.      Relevant Orders   Comprehensive metabolic panel     Respiratory   COPD (chronic obstructive pulmonary disease) (HCC) - Primary    Chronic, ongoing. Spirometry July 2023 =  FEV1 52% FEV1/FVC 113%. Tolerating change to Stiolto with improved symptoms and decreased exacerbations -- continue this and Albuterol as needed.  Continue Singulair and Claritin for allergies as she is tolerating well.  Return in 6 months.        Musculoskeletal and Integument   Osteopenia of neck of left femur    Ongoing noted on recent DEXA, some decline from previous with T-score -2.3 and previous in 2015 was -1.2.  Educated her on these findings and current treatment.  She is to take Vitamin D3 2000 units daily and intake adequate calcium daily + perform weight bearing exercises. Check Vit D level next visit and plan on repeat DEXA in 5 years, sooner if fracture presents.        Other   Anxiety    Refer to depression plan of care.      Relevant Medications   sertraline (ZOLOFT) 100 MG tablet   busPIRone (BUSPAR) 5 MG tablet   Depression, recurrent (HCC)    Chronic, stable.  Will continue Sertraline and Buspar at this time.  Buspar safer alternative to take for increased anxiety.  Denies SI/HI.  Return to office in 6 months.      Relevant Medications   sertraline (ZOLOFT) 100 MG tablet   busPIRone (BUSPAR) 5 MG tablet   Hyperlipidemia    Chronic, ongoing.  Continue current medication regimen and adjust as needed.  Lipid panel today.      Relevant Orders   Comprehensive metabolic panel   Lipid Panel w/o Chol/HDL Ratio   Other Visit Diagnoses     Flu vaccine need       Flu vaccine in office today.   Relevant Orders   Flu Vaccine QUAD High Dose(Fluad) (Completed)        Follow up plan: Return in about 6 months (around 07/17/2023) for Annual physical with spirometry.

## 2023-01-17 LAB — COMPREHENSIVE METABOLIC PANEL
ALT: 9 IU/L (ref 0–32)
AST: 17 IU/L (ref 0–40)
Albumin/Globulin Ratio: 1.5 (ref 1.2–2.2)
Albumin: 4.1 g/dL (ref 3.8–4.8)
Alkaline Phosphatase: 96 IU/L (ref 44–121)
BUN/Creatinine Ratio: 10 — ABNORMAL LOW (ref 12–28)
BUN: 8 mg/dL (ref 8–27)
Bilirubin Total: 0.5 mg/dL (ref 0.0–1.2)
CO2: 25 mmol/L (ref 20–29)
Calcium: 9.3 mg/dL (ref 8.7–10.3)
Chloride: 98 mmol/L (ref 96–106)
Creatinine, Ser: 0.77 mg/dL (ref 0.57–1.00)
Globulin, Total: 2.7 g/dL (ref 1.5–4.5)
Glucose: 80 mg/dL (ref 70–99)
Potassium: 4.2 mmol/L (ref 3.5–5.2)
Sodium: 136 mmol/L (ref 134–144)
Total Protein: 6.8 g/dL (ref 6.0–8.5)
eGFR: 80 mL/min/{1.73_m2} (ref >59)

## 2023-01-17 LAB — LIPID PANEL W/O CHOL/HDL RATIO
Cholesterol, Total: 134 mg/dL (ref 100–199)
HDL: 63 mg/dL (ref >39)
LDL Chol Calc (NIH): 57 mg/dL (ref 0–99)
Triglycerides: 66 mg/dL (ref 0–149)
VLDL Cholesterol Cal: 14 mg/dL (ref 5–40)

## 2023-01-17 NOTE — Progress Notes (Signed)
Contacted via [facility] Nathan, your labs have returned: - Kidney function, creatinine and eGFR, remains normal, as is liver function, AST and ALT.  - Lipid panel shows goal levels.  No medication changes needed.  Continue all current medications.  Any questions? Keep being amazing!!  Thank you for allowing me to participate in your care.  I appreciate you. Kindest regards, Taye Cato

## 2023-01-26 ENCOUNTER — Encounter: Payer: Self-pay | Admitting: Nurse Practitioner

## 2023-01-27 ENCOUNTER — Encounter: Payer: Self-pay | Admitting: Nurse Practitioner

## 2023-01-27 ENCOUNTER — Telehealth: Payer: Medicare PPO | Admitting: Nurse Practitioner

## 2023-01-27 DIAGNOSIS — J441 Chronic obstructive pulmonary disease with (acute) exacerbation: Secondary | ICD-10-CM | POA: Insufficient documentation

## 2023-01-27 MED ORDER — EASY AIR COMPRESSOR NEBULIZER MISC: 0 refills | Status: AC

## 2023-01-27 MED ORDER — ALBUTEROL SULFATE (2.5 MG/3ML) 0.083% IN NEBU: 2.5000 mg | INHALATION_SOLUTION | Freq: Four times a day (QID) | RESPIRATORY_TRACT | 1 refills | Status: AC | PRN

## 2023-01-27 MED ORDER — PREDNISONE 20 MG PO TABS: 40.0000 mg | ORAL_TABLET | Freq: Every day | ORAL | 0 refills | Status: AC

## 2023-01-27 MED ORDER — HYDROCOD POLI-CHLORPHE POLI ER 10-8 MG/5ML PO SUER: 5.0000 mL | Freq: Every evening | ORAL | 0 refills | Status: DC | PRN

## 2023-01-27 MED ORDER — AZITHROMYCIN 250 MG PO TABS: ORAL_TABLET | ORAL | 0 refills | Status: DC

## 2023-01-27 NOTE — Patient Instructions (Signed)
COPD Exacerbation This video will teach you what types of triggers can make COPD worse, and how to avoid them. To view the content, go to this web address: [email or web address]  This video will expire on: 11/16/2024. If you need access to this video following this date, please reach out to the healthcare provider who assigned it to you. This information is not intended to replace advice given to you by your health care provider. Make sure you discuss any questions you have with your health care provider. Elsevier Patient Education  [place].

## 2023-01-27 NOTE — Progress Notes (Signed)
Appointment has been made

## 2023-01-27 NOTE — Progress Notes (Signed)
There were no vitals taken for this visit.   Subjective:    Patient ID: Shelly Bridges, female    DOB: Apr 19, 1946, 77 y.o.   MRN: [id number]  HPI: Shelly Bridges is a 77 y.o. female  Chief Complaint  Patient presents with   Cough    With nasal congestion, eye itching, and eye redness.All started yesterday   This visit was completed via video visit through MyChart due to the restrictions of the COVID-19 pandemic. All issues as above were discussed and addressed. Physical exam was done as above through visual confirmation on video through MyChart. If it was felt that the patient should be evaluated in the office, they were directed there. The patient verbally consented to this visit. Location of the patient: home Location of the provider: work Those involved with this call:  Provider: Marnee Guarneri, DNP CMA: Frazier Butt, [place] Desk/Registration: [employer]  Time spent on call:  21 minutes with patient face to face via video conference. More than 50% of this time was spent in counseling and coordination of care. 15 minutes total spent in review of patient's record and preparation of their chart.  I verified patient identity using two factors (patient name and date of birth). Patient consents verbally to being seen via telemedicine visit today.    UPPER RESPIRATORY TRACT INFECTION Underlying COPD.  Last exacerbation 11/23/21.  Her grand kids had been sick, but her illness is different with dry hacking cough and watery eyes. Current symptoms started a few days ago.  Has not Covid tested. Fever: no Cough: yes Shortness of breath: yes Wheezing: yes Chest pain: no Chest tightness: yes Chest congestion: yes Nasal congestion: no Runny nose: no Post nasal drip: no Sneezing: yes Sore throat: no Swollen glands: no Sinus pressure: no Headache: no Face pain: no Toothache: no Ear pain: none Ear pressure: none Eyes red/itching: yes Eye drainage/crusting: no   Vomiting: no Rash: no Fatigue: yes Sick contacts: yes Strep contacts: no  Context: fluctuating Recurrent sinusitis: no Relief with OTC cold/cough medications: yes  Treatments attempted: cold/sinus    Relevant past medical, surgical, family and social history reviewed and updated as indicated. Interim medical history since our last visit reviewed. Allergies and medications reviewed and updated.  Review of Systems  Constitutional:  Positive for fatigue. Negative for activity change, appetite change, chills and fever.  HENT:  Positive for sneezing. Negative for congestion, ear discharge, ear pain, facial swelling, postnasal drip, rhinorrhea, sinus pressure, sinus pain, sore throat and voice change.   Eyes:  Negative for pain and visual disturbance.  Respiratory:  Positive for cough, chest tightness, shortness of breath and wheezing.   Cardiovascular:  Negative for chest pain, palpitations and leg swelling.  Gastrointestinal: Negative.   Endocrine: Negative.   Musculoskeletal:  Negative for myalgias.  Neurological:  Negative for dizziness, numbness and headaches.  Psychiatric/Behavioral: Negative.      Per HPI unless specifically indicated above     Objective:    There were no vitals taken for this visit.  Wt Readings from Last 3 Encounters:  01/16/23 123 lb 12.8 oz (56.2 kg)  08/16/22 142 lb 14.4 oz (64.8 kg)  06/20/22 141 lb 6.4 oz (64.1 kg)    Physical Exam Vitals and nursing note reviewed.  Constitutional:      General: She is awake. She is not in acute distress.    Appearance: She is well-developed and well-groomed. She is not ill-appearing or toxic-appearing.     Comments:  Frequent dry, hacking cough present and hoarseness.  HENT:     Head: Normocephalic.     Right Ear: Hearing normal.     Left Ear: Hearing normal.  Eyes:     General: Lids are normal.        Right eye: No discharge.        Left eye: No discharge.     Conjunctiva/sclera: Conjunctivae normal.   Pulmonary:     Effort: Pulmonary effort is normal. No accessory muscle usage or respiratory distress.  Musculoskeletal:     Cervical back: Normal range of motion.  Neurological:     Mental Status: She is alert and oriented to person, place, and time.  Psychiatric:        Attention and Perception: Attention normal.        Mood and Affect: Mood normal.        Behavior: Behavior normal. Behavior is cooperative.        Thought Content: Thought content normal.        Judgment: Judgment normal.     Results for orders placed or performed in visit on 01/16/23  Comprehensive metabolic panel  Result Value Ref Range   Glucose 80 70 - 99 mg/dL   BUN 8 8 - 27 mg/dL   Creatinine, Ser 0.77 0.57 - 1.00 mg/dL   eGFR 80 >59 mL/min/1.73   BUN/Creatinine Ratio 10 (L) 12 - 28   Sodium 136 134 - 144 mmol/L   Potassium 4.2 3.5 - 5.2 mmol/L   Chloride 98 96 - 106 mmol/L   CO2 25 20 - 29 mmol/L   Calcium 9.3 8.7 - 10.3 mg/dL   Total Protein 6.8 6.0 - 8.5 g/dL   Albumin 4.1 3.8 - 4.8 g/dL   Globulin, Total 2.7 1.5 - 4.5 g/dL   Albumin/Globulin Ratio 1.5 1.2 - 2.2   Bilirubin Total 0.5 0.0 - 1.2 mg/dL   Alkaline Phosphatase 96 44 - 121 IU/L   AST 17 0 - 40 IU/L   ALT 9 0 - 32 IU/L  Lipid Panel w/o Chol/HDL Ratio  Result Value Ref Range   Cholesterol, Total 134 100 - 199 mg/dL   Triglycerides 66 0 - 149 mg/dL   HDL 63 >39 mg/dL   VLDL Cholesterol Cal 14 5 - 40 mg/dL   LDL Chol Calc (NIH) 57 0 - 99 mg/dL      Assessment & Plan:   Problem List Items Addressed This Visit       Respiratory   COPD exacerbation (HCC) - Primary    Acute exacerbation of her COPD.  At this time will start Zpack for 5 days + Prednisone 40 MG daily for 5 days.  For symptoms management: Tussionex at night for cough + Albuterol nebulizer machine and solution sent in.  Continue current inhaler regimen at this time.  Recommend: - Increased rest - Increasing Fluids - Acetaminophen as needed for fever/pain.  - Salt  water gargling, chloraseptic spray and throat lozenges - Mucinex.  - Humidifying the air.  Return in one week for follow-up and lung check.      Relevant Medications   azithromycin (ZITHROMAX) 250 MG tablet   predniSONE (DELTASONE) 20 MG tablet   albuterol (PROVENTIL) (2.5 MG/3ML) 0.083% nebulizer solution   chlorpheniramine-HYDROcodone (TUSSIONEX) 10-8 MG/5ML    I discussed the assessment and treatment plan with the patient. The patient was provided an opportunity to ask questions and all were answered. The patient agreed with the plan and demonstrated an  understanding of the instructions.   The patient was advised to call back or seek an in-person evaluation if the symptoms worsen or if the condition fails to improve as anticipated.   I provided 21+ minutes of time during this encounter.    Follow up plan: Return in about 1 week (around 02/03/2023) for COPD exacerbation.

## 2023-01-27 NOTE — Assessment & Plan Note (Signed)
Acute exacerbation of her COPD.  At this time will start Zpack for 5 days + Prednisone 40 MG daily for 5 days.  For symptoms management: Tussionex at night for cough + Albuterol nebulizer machine and solution sent in.  Continue current inhaler regimen at this time.  Recommend: - Increased rest - Increasing Fluids - Acetaminophen as needed for fever/pain.  - Salt water gargling, chloraseptic spray and throat lozenges - Mucinex.  - Humidifying the air.  Return in one week for follow-up and lung check.

## 2023-01-30 ENCOUNTER — Encounter: Payer: Self-pay | Admitting: Nurse Practitioner

## 2023-01-30 ENCOUNTER — Ambulatory Visit
Admission: RE | Admit: 2023-01-30 | Discharge: 2023-01-30 | Disposition: A | Discharge status: Home / Self Care | Payer: Medicare PPO | Source: Home / Self Care | Attending: Nurse Practitioner | Admitting: Nurse Practitioner

## 2023-01-30 ENCOUNTER — Ambulatory Visit
Admission: RE | Admit: 2023-01-30 | Discharge: 2023-01-30 | Disposition: A | Discharge status: Home / Self Care | Payer: Medicare PPO | Source: Ambulatory Visit | Attending: Nurse Practitioner | Admitting: Nurse Practitioner

## 2023-01-30 DIAGNOSIS — R059 Cough, unspecified: Secondary | ICD-10-CM | POA: Diagnosis not present

## 2023-01-30 DIAGNOSIS — J441 Chronic obstructive pulmonary disease with (acute) exacerbation: Secondary | ICD-10-CM

## 2023-01-30 DIAGNOSIS — R0602 Shortness of breath: Secondary | ICD-10-CM | POA: Diagnosis not present

## 2023-01-30 DIAGNOSIS — J449 Chronic obstructive pulmonary disease, unspecified: Secondary | ICD-10-CM | POA: Diagnosis not present

## 2023-01-30 MED ORDER — AMOXICILLIN-POT CLAVULANATE 875-125 MG PO TABS: 1.0000 | ORAL_TABLET | Freq: Two times a day (BID) | ORAL | 0 refills | Status: DC

## 2023-01-30 NOTE — Addendum Note (Signed)
Addended by: Marnee Guarneri T on: 01/30/2023 12:13 PM   Modules accepted: Orders

## 2023-01-31 MED ORDER — BENZONATATE 100 MG PO CAPS: 100.0000 mg | ORAL_CAPSULE | Freq: Three times a day (TID) | ORAL | 0 refills | Status: DC | PRN

## 2023-01-31 NOTE — Addendum Note (Signed)
Addended by: Venita Lick on: 01/31/2023 05:22 PM   Modules accepted: Orders

## 2023-02-01 NOTE — Progress Notes (Signed)
Contacted via MyChart   Good morning Shelly Bridges, you imaging returned with no pneumonia seen.  Suspect this is more an exacerbation of your lung disease:(. We jinxed it saying how well you had done for so long.  I do suspect this will improve.  It could even possible be some RSV, we are seeing similar symptoms with this.  The cough can be very rough.  How are you today?

## 2023-02-04 NOTE — Patient Instructions (Signed)

## 2023-02-06 ENCOUNTER — Encounter: Payer: Self-pay | Admitting: Nurse Practitioner

## 2023-02-06 ENCOUNTER — Ambulatory Visit (INDEPENDENT_AMBULATORY_CARE_PROVIDER_SITE_OTHER): Payer: Medicare PPO

## 2023-02-06 ENCOUNTER — Ambulatory Visit: Payer: Medicare PPO | Admitting: Nurse Practitioner

## 2023-02-06 VITALS — Ht 60.0 in | Wt 123.0 lb

## 2023-02-06 VITALS — BP 129/73 | HR 74 | Temp 98.1°F | Ht 60.0 in | Wt 125.3 lb

## 2023-02-06 DIAGNOSIS — J441 Chronic obstructive pulmonary disease with (acute) exacerbation: Secondary | ICD-10-CM

## 2023-02-06 DIAGNOSIS — Z0000 Encounter for general adult medical examination without abnormal findings: Secondary | ICD-10-CM

## 2023-02-06 NOTE — Progress Notes (Signed)
I connected with  Shelly Bridges on 02/06/23 by a audio enabled telemedicine application and verified that I am speaking with the correct person using two identifiers.  Patient Location: Home  Provider Location: Office/Clinic  I discussed the limitations of evaluation and management by telemedicine. The patient expressed understanding and agreed to proceed.  Subjective:   Shelly Bridges is a 77 y.o. female who presents for Medicare Annual (Subsequent) preventive examination.  Review of Systems     Cardiac Risk Factors include: advanced age (>40mn, >>17women)     Objective:    There were no vitals filed for this visit. There is no height or weight on file to calculate BMI.     02/06/2023   10:08 AM 11/08/2021   11:21 AM 11/06/2020   10:42 AM 10/28/2019    8:22 AM 10/25/2018    8:03 AM 10/11/2017   10:53 AM 09/30/2016   10:05 AM  Advanced Directives  Does Patient Have a Medical Advance Directive? No No No No Yes Yes No  Type of APersonnel officerLiving will    Does patient want to make changes to medical advance directive?     Yes (MAU/Ambulatory/Procedural Areas - Information given) Yes (MAU/Ambulatory/Procedural Areas - Information given)   Copy of [place]?     No - copy requested    Would patient like information on creating a medical advance directive? No - Patient declined No - Patient declined         Current Medications (verified) Outpatient Encounter Medications as of 02/06/2023  Medication Sig   albuterol (PROVENTIL) (2.5 MG/3ML) 0.083% nebulizer solution Take 3 mLs (2.5 mg total) by nebulization every 6 (six) hours as needed for wheezing or shortness of breath.   albuterol (VENTOLIN HFA) 108 (90 Base) MCG/ACT inhaler INHALE 2 PUFFS INTO THE LUNGS EVERY 4 HOURS AS NEEDED FOR WHEEZING OR SHORTNESS OF BREATH   busPIRone (BUSPAR) 5 MG tablet Take 1 tablet (5 mg total) by mouth 2 (two) times  daily as needed.   Nebulizers (EASY AIR COMPRESSOR NEBULIZER) MISC Use to inhaler nebulizer treatments at needed per instructions on nebulizer prescription   rosuvastatin (CRESTOR) 20 MG tablet Take 1 tablet (20 mg total) by mouth daily.   sertraline (ZOLOFT) 100 MG tablet Take 1 tablet (100 mg total) by mouth daily.   Tiotropium Bromide-Olodaterol 2.5-2.5 MCG/ACT AERS Inhale 2 puffs into the lungs daily.   Vitamin D, Ergocalciferol, 50 MCG (2000 UT) CAPS Take 2,000 Units by mouth. 1 x day   amoxicillin-clavulanate (AUGMENTIN) 875-125 MG tablet Take 1 tablet by mouth 2 (two) times daily for 7 days. (Patient not taking: Reported on 02/06/2023)   augmented betamethasone dipropionate (DIPROLENE AF) 0.05 % cream Apply topically 2 (two) times daily. (Patient not taking: Reported on 02/06/2023)   benzonatate (TESSALON PERLES) 100 MG capsule Take 1 capsule (100 mg total) by mouth 3 (three) times daily as needed. (Patient not taking: Reported on 02/06/2023)   chlorpheniramine-HYDROcodone (TUSSIONEX) 10-8 MG/5ML Take 5 mLs by mouth at bedtime as needed for cough. (Patient not taking: Reported on 02/06/2023)   montelukast (SINGULAIR) 10 MG tablet TAKE 1 TABLET(10 MG) BY MOUTH AT BEDTIME (Patient not taking: Reported on 02/06/2023)   nystatin cream (MYCOSTATIN) Apply 1 application topically 2 (two) times daily. (Patient not taking: Reported on 02/06/2023)   No facility-administered encounter medications on file as of 02/06/2023.    Allergies (verified) Patient has no known  allergies.   History: Past Medical History:  Diagnosis Date   Anxiety    Asthma    Lipoma    Neutropenia ([place])    Osteoporosis    Vitamin D deficiency disease    Past Surgical History:  Procedure Laterality Date   CATARACT EXTRACTION Bilateral 03/2022   Family History  Problem Relation Age of Onset   Dementia Mother    Hypertension Mother    Osteoporosis Mother    Stroke Mother        mini stroke   Cancer Father        brain tumor  and lung   Hypertension Father    Emphysema Father    COPD Neg Hx    Heart disease Neg Hx    Social History   Socioeconomic History   Marital status: Divorced    Spouse name: Not on file   Number of children: 3   Years of education: Not on file   Highest education level: Not on file  Occupational History   Not on file  Tobacco Use   Smoking status: Former    Packs/day: 1.00    Years: 20.00    Total pack years: 20.00    Types: Cigarettes    Quit date: 06/17/1985    Years since quitting: 37.6   Smokeless tobacco: Never  Vaping Use   Vaping Use: Never used  Substance and Sexual Activity   Alcohol use: No   Drug use: No   Sexual activity: Not Currently  Other Topics Concern   Not on file  Social History Narrative   Not on file   Social Determinants of Health   Financial Resource Strain: Low Risk  (02/06/2023)   Overall Financial Resource Strain (CARDIA)    Difficulty of Paying Living Expenses: Not hard at all  Food Insecurity: No Food Insecurity (02/06/2023)   Hunger Vital Sign    Worried About Running Out of Food in the Last Year: Never true    Ran Out of Food in the Last Year: Never true  Transportation Needs: No Transportation Needs (02/06/2023)   PRAPARE - Hydrologist (Medical): No    Lack of Transportation (Non-Medical): No  Physical Activity: Insufficiently Active (02/06/2023)   Exercise Vital Sign    Days of Exercise per Week: 3 days    Minutes of Exercise per Session: 30 min  Stress: No Stress Concern Present (02/06/2023)   [place]    Feeling of Stress : Only a little  Social Connections: Socially Isolated (02/06/2023)   Social Connection and Isolation Panel [NHANES]    Frequency of Communication with Friends and Family: More than three times a week    Frequency of Social Gatherings with Friends and Family: Three times a week    Attends Religious Services: Never     Active Member of Clubs or Organizations: No    Attends Archivist Meetings: Never    Marital Status: Widowed    Tobacco Counseling Counseling given: Not Answered   Clinical Intake:  Pre-visit preparation completed: Yes  Pain : No/denies pain     Nutritional Risks: None Diabetes: No  How often do you need to have someone help you when you read instructions, pamphlets, or other written materials from your doctor or pharmacy?: 1 - Never  Diabetic?no  Interpreter Needed?: No  Information entered by :: Kirke Shaggy, LPN   Activities of Daily Living    02/06/2023  10:11 AM 02/02/2023    2:11 PM  In your present state of health, do you have any difficulty performing the following activities:  Hearing? 0 0  Vision? 0 0  Difficulty concentrating or making decisions? 0 0  Walking or climbing stairs? 0 0  Dressing or bathing? 0 0  Doing errands, shopping? 0 0  Preparing Food and eating ? N N  Using the Toilet? N N  In the past six months, have you accidently leaked urine? N N  Do you have problems with loss of bowel control? N N  Managing your Medications? N N  Managing your Finances? N N  Housekeeping or managing your Housekeeping? N N    Patient Care Team: Venita Lick, NP as PCP - General (Nurse Practitioner)  Indicate any recent Medical Services you may have received from other than [facility] providers in the past year (date may be approximate).     Assessment:   This is a routine wellness examination for Shelly Bridges.  Hearing/Vision screen Hearing Screening - Comments:: No aids Vision Screening - Comments:: Wears glasses- Dr.Woodard  Dietary issues and exercise activities discussed:     Goals Addressed             This Visit's Progress    DIET - EAT MORE FRUITS AND VEGETABLES         Depression Screen    02/06/2023   10:13 AM 01/16/2023    9:44 AM 08/16/2022   10:09 AM 06/20/2022    9:42 AM 12/21/2021    1:33 PM 11/15/2021   10:52 AM  11/08/2021   11:20 AM  PHQ 2/9 Scores  PHQ - 2 Score 0 '2 2 2 2 '$ 0 0  PHQ- 9 Score 0 '8 7 7 8 5     '$ Fall Risk    02/06/2023   10:09 AM 02/02/2023    2:11 PM 01/16/2023    9:44 AM 01/15/2023   11:22 AM 08/16/2022   10:05 AM  Fall Risk   Falls in the past year? '1 1 1 1 '$ 0  Number falls in past yr: '1 1 1 1 '$ 0  Injury with Fall? 0 0 1 0 0  Risk for fall due to : History of fall(s)  History of fall(s)  No Fall Risks  Follow up Falls evaluation completed;Falls prevention discussed    Falls evaluation completed    FALL RISK PREVENTION PERTAINING TO THE HOME:  Any stairs in or around the home? No  If so, are there any without handrails? No  Home free of loose throw rugs in walkways, pet beds, electrical cords, etc? Yes  Adequate lighting in your home to reduce risk of falls? Yes   ASSISTIVE DEVICES UTILIZED TO PREVENT FALLS:  Life alert? No  Use of a cane, walker or w/c? No  Grab bars in the bathroom? No  Shower chair or bench in shower? Yes  Elevated toilet seat or a handicapped toilet? No    Cognitive Function:        02/06/2023   10:18 AM 11/06/2020   10:44 AM 10/25/2018    8:07 AM 10/11/2017   10:55 AM  6CIT Screen  What Year? 0 points 0 points 0 points 0 points  What month? 0 points 0 points 0 points 0 points  What time? 0 points 0 points 0 points 0 points  Count back from 20 0 points 0 points 0 points 0 points  Months in reverse 0 points 2 points 0 points 0  points  Repeat phrase 0 points 2 points 0 points 2 points  Total Score 0 points 4 points 0 points 2 points    Immunizations Immunization History  Administered Date(s) Administered   Fluad Quad(high Dose 65+) 09/19/2019, 08/05/2021, 01/16/2023   Influenza, High Dose Seasonal PF 10/25/2016, 10/11/2017, 10/05/2018   Influenza,inj,Quad PF,6+ Mos 10/26/2015   Influenza-Unspecified 10/08/2020   Moderna Sars-Covid-2 Vaccination 03/14/2020, 04/11/2020, 10/08/2020   Pneumococcal Conjugate-13 07/03/2014   Pneumococcal  Polysaccharide-23 10/26/2015   Td 12/05/2008   Tdap 06/04/2018   Zoster Recombinat (Shingrix) 01/28/2019, 07/02/2019    TDAP status: Up to date  Flu Vaccine status: Up to date  Pneumococcal vaccine status: Up to date  Covid-19 vaccine status: Completed vaccines  Qualifies for Shingles Vaccine? Yes   Zostavax completed No   Shingrix Completed?: Yes  Screening Tests Health Maintenance  Topic Date Due   COVID-19 Vaccine (4 - 2023-24 season) 08/05/2022   Medicare Annual Wellness (AWV)  02/06/2024   DEXA SCAN  12/20/2026   DTaP/Tdap/Td (3 - Td or Tdap) 06/04/2028   Pneumonia Vaccine 7+ Years old  Completed   INFLUENZA VACCINE  Completed   Hepatitis C Screening  Completed   Zoster Vaccines- Shingrix  Completed   HPV VACCINES  Aged Out   Fecal DNA (Cologuard)  Discontinued    Health Maintenance  Health Maintenance Due  Topic Date Due   COVID-19 Vaccine (4 - 2023-24 season) 08/05/2022    Colorectal cancer screening: No longer required.   Mammogram status: No longer required due to age.  Bone Density status: Completed 12/20/21. Results reflect: Bone density results: NORMAL. Repeat every 5 years.  Lung Cancer Screening: (Low Dose CT Chest recommended if Age 55-80 years, 30 pack-year currently smoking OR have quit w/in 15years.) does not qualify.     Additional Screening:  Hepatitis C Screening: does qualify; Completed 10/11/17  Vision Screening: Recommended annual ophthalmology exams for early detection of glaucoma and other disorders of the eye. Is the patient up to date with their annual eye exam?  Yes  Who is the provider or what is the name of the office in which the patient attends annual eye exams? Dr.Woodard If pt is not established with a provider, would they like to be referred to a provider to establish care? No .   Dental Screening: Recommended annual dental exams for proper oral hygiene  Community Resource Referral / Chronic Care Management: CRR required  this visit?  No   CCM required this visit?  No      Plan:     I have personally reviewed and noted the following in the patient's chart:   Medical and social history Use of alcohol, tobacco or illicit drugs  Current medications and supplements including opioid prescriptions. Patient is not currently taking opioid prescriptions. Functional ability and status Nutritional status Physical activity Advanced directives List of other physicians Hospitalizations, surgeries, and ER visits in previous 12 months Vitals Screenings to include cognitive, depression, and falls Referrals and appointments  In addition, I have reviewed and discussed with patient certain preventive protocols, quality metrics, and best practice recommendations. A written personalized care plan for preventive services as well as general preventive health recommendations were provided to patient.     Dionisio David, LPN   [id number]   Nurse Notes: none

## 2023-02-06 NOTE — Progress Notes (Signed)
BP 129/73   Pulse 74   Temp 98.1 F (36.7 C) (Oral)   Ht 5' (1.524 m)   Wt 125 lb 4.8 oz (56.8 kg)   SpO2 97%   BMI 24.47 kg/m    Subjective:    Patient ID: Shelly Bridges, female    DOB: November 04, 1946, 77 y.o.   MRN: [id number]  HPI: [name] is a 77 y.o. female  Chief Complaint  Patient presents with   COPD Exacerbation    F/U from last week, patient states that she is feeling a lot better   COPD Treated for exacerbation on 01/27/23 with Azithromycin and Prednisone, then added on Augmentin due to lingering symptoms.  Imaging noted no PNA.  Prior exacerbation was 11/23/21.  Overall reports slowly improving, not coughing near what she was.  Her grand children had been sick as well.    Continues on Stiolto and Albuterol at home. COPD status: stable Satisfied with current treatment?: yes Oxygen use: no Dyspnea frequency: no Cough frequency: improving overall Rescue inhaler frequency: nebulizer last yesterday Limitation of activity: no -- took easy all last week, but overall improving Productive cough: no, minimal Last Spirometry: 06/20/22 Pneumovax: Up to Date Influenza: Up to Date   Relevant past medical, surgical, family and social history reviewed and updated as indicated. Interim medical history since our last visit reviewed. Allergies and medications reviewed and updated.  Review of Systems  Constitutional:  Negative for activity change, appetite change, diaphoresis, fatigue and fever.  Respiratory:  Positive for cough. Negative for chest tightness, shortness of breath and wheezing.   Cardiovascular:  Negative for chest pain, palpitations and leg swelling.  Gastrointestinal: Negative.   Neurological: Negative.   Psychiatric/Behavioral: Negative.     Per HPI unless specifically indicated above     Objective:    BP 129/73   Pulse 74   Temp 98.1 F (36.7 C) (Oral)   Ht 5' (1.524 m)   Wt 125 lb 4.8 oz (56.8 kg)   SpO2 97%   BMI 24.47  kg/m   Wt Readings from Last 3 Encounters:  02/06/23 125 lb 4.8 oz (56.8 kg)  02/06/23 123 lb (55.8 kg)  01/16/23 123 lb 12.8 oz (56.2 kg)    Physical Exam Vitals and nursing note reviewed.  Constitutional:      General: She is awake. She is not in acute distress.    Appearance: She is well-developed and well-groomed. She is not ill-appearing or toxic-appearing.  HENT:     Head: Normocephalic.     Right Ear: Hearing, tympanic membrane, ear canal and external ear normal.     Left Ear: Hearing, tympanic membrane, ear canal and external ear normal.     Nose: Nose normal. No rhinorrhea.     Right Sinus: No maxillary sinus tenderness or frontal sinus tenderness.     Left Sinus: No maxillary sinus tenderness or frontal sinus tenderness.     Mouth/Throat:     Mouth: Mucous membranes are moist.     Pharynx: No pharyngeal swelling, oropharyngeal exudate or posterior oropharyngeal erythema.  Eyes:     General: Lids are normal.        Right eye: No discharge.        Left eye: No discharge.     Conjunctiva/sclera: Conjunctivae normal.     Pupils: Pupils are equal, round, and reactive to light.  Neck:     Thyroid: No thyromegaly.     Vascular: No carotid bruit or JVD.  Cardiovascular:     Rate and Rhythm: Normal rate and regular rhythm.     Heart sounds: Normal heart sounds. No murmur heard.    No gallop.  Pulmonary:     Effort: Pulmonary effort is normal. No accessory muscle usage or respiratory distress.     Breath sounds: Normal breath sounds. No decreased breath sounds, wheezing or rhonchi.  Abdominal:     General: Bowel sounds are normal.     Palpations: Abdomen is soft. There is no hepatomegaly or splenomegaly.  Musculoskeletal:     Cervical back: Normal range of motion and neck supple.     Right lower leg: No edema.     Left lower leg: No edema.  Lymphadenopathy:     Head:     Right side of head: No submental, submandibular, tonsillar, preauricular or posterior auricular  adenopathy.     Left side of head: No submental, submandibular, tonsillar, preauricular or posterior auricular adenopathy.     Cervical: No cervical adenopathy.  Skin:    General: Skin is warm and dry.  Neurological:     Mental Status: She is alert and oriented to person, place, and time.  Psychiatric:        Attention and Perception: Attention normal.        Mood and Affect: Mood normal.        Behavior: Behavior normal. Behavior is cooperative.        Thought Content: Thought content normal.        Judgment: Judgment normal.    Results for orders placed or performed in visit on 01/16/23  Comprehensive metabolic panel  Result Value Ref Range   Glucose 80 70 - 99 mg/dL   BUN 8 8 - 27 mg/dL   Creatinine, Ser 0.77 0.57 - 1.00 mg/dL   eGFR 80 >59 mL/min/1.73   BUN/Creatinine Ratio 10 (L) 12 - 28   Sodium 136 134 - 144 mmol/L   Potassium 4.2 3.5 - 5.2 mmol/L   Chloride 98 96 - 106 mmol/L   CO2 25 20 - 29 mmol/L   Calcium 9.3 8.7 - 10.3 mg/dL   Total Protein 6.8 6.0 - 8.5 g/dL   Albumin 4.1 3.8 - 4.8 g/dL   Globulin, Total 2.7 1.5 - 4.5 g/dL   Albumin/Globulin Ratio 1.5 1.2 - 2.2   Bilirubin Total 0.5 0.0 - 1.2 mg/dL   Alkaline Phosphatase 96 44 - 121 IU/L   AST 17 0 - 40 IU/L   ALT 9 0 - 32 IU/L  Lipid Panel w/o Chol/HDL Ratio  Result Value Ref Range   Cholesterol, Total 134 100 - 199 mg/dL   Triglycerides 66 0 - 149 mg/dL   HDL 63 >39 mg/dL   VLDL Cholesterol Cal 14 5 - 40 mg/dL   LDL Chol Calc (NIH) 57 0 - 99 mg/dL      Assessment & Plan:   Problem List Items Addressed This Visit       Respiratory   COPD exacerbation ([place]) - Primary    Acute and improved at this time.  Continue current inhaler regimen and we will adjust as needed if more frequent exacerbations present.        Follow up plan: Return if symptoms worsen or fail to improve.

## 2023-02-06 NOTE — Patient Instructions (Signed)
Shelly Bridges , Thank you for taking time to come for your Medicare Wellness Visit. I appreciate your ongoing commitment to your health goals. Please review the following plan we discussed and let me know if I can assist you in the future.   These are the goals we discussed:  Goals      DIET - EAT MORE FRUITS AND VEGETABLES     Increase water intake     Recommend drinking at least 4-5 glasses of water a day     Patient Stated     11/06/2020, no goals     Patient Stated     Continue current lifestyle        This is a list of the screening recommended for you and due dates:  Health Maintenance  Topic Date Due   COVID-19 Vaccine (4 - 2023-24 season) 08/05/2022   Medicare Annual Wellness Visit  02/06/2024   DEXA scan (bone density measurement)  12/20/2026   DTaP/Tdap/Td vaccine (3 - Td or Tdap) 06/04/2028   Pneumonia Vaccine  Completed   Flu Shot  Completed   Hepatitis C Screening: USPSTF Recommendation to screen - Ages 59-79 yo.  Completed   Zoster (Shingles) Vaccine  Completed   HPV Vaccine  Aged Out   Cologuard (Stool DNA test)  Discontinued    Advanced directives: no  Conditions/risks identified: none  Next appointment: Follow up in one year for your annual wellness visit 02/12/24 @ 9:30 am by phone   Preventive Care 65 Years and Older, Female Preventive care refers to lifestyle choices and visits with your health care provider that can promote health and wellness. What does preventive care include? A yearly physical exam. This is also called an annual well check. Dental exams once or twice a year. Routine eye exams. Ask your health care provider how often you should have your eyes checked. Personal lifestyle choices, including: Daily care of your teeth and gums. Regular physical activity. Eating a healthy diet. Avoiding tobacco and drug use. Limiting alcohol use. Practicing safe sex. Taking low-dose aspirin every day. Taking vitamin and mineral supplements as  recommended by your health care provider. What happens during an annual well check? The services and screenings done by your health care provider during your annual well check will depend on your age, overall health, lifestyle risk factors, and family history of disease. Counseling  Your health care provider may ask you questions about your: Alcohol use. Tobacco use. Drug use. Emotional well-being. Home and relationship well-being. Sexual activity. Eating habits. History of falls. Memory and ability to understand (cognition). Work and work Statistician. Reproductive health. Screening  You may have the following tests or measurements: Height, weight, and BMI. Blood pressure. Lipid and cholesterol levels. These may be checked every 5 years, or more frequently if you are over 20 years old. Skin check. Lung cancer screening. You may have this screening every year starting at age 54 if you have a 30-pack-year history of smoking and currently smoke or have quit within the past 15 years. Fecal occult blood test (FOBT) of the stool. You may have this test every year starting at age 74. Flexible sigmoidoscopy or colonoscopy. You may have a sigmoidoscopy every 5 years or a colonoscopy every 10 years starting at age 93. Hepatitis C blood test. Hepatitis B blood test. Sexually transmitted disease (STD) testing. Diabetes screening. This is done by checking your blood sugar (glucose) after you have not eaten for a while (fasting). You may have this done every  1-3 years. Bone density scan. This is done to screen for osteoporosis. You may have this done starting at age 42. Mammogram. This may be done every 1-2 years. Talk to your health care provider about how often you should have regular mammograms. Talk with your health care provider about your test results, treatment options, and if necessary, the need for more tests. Vaccines  Your health care provider may recommend certain vaccines, such  as: Influenza vaccine. This is recommended every year. Tetanus, diphtheria, and acellular pertussis (Tdap, Td) vaccine. You may need a Td booster every 10 years. Zoster vaccine. You may need this after age 40. Pneumococcal 13-valent conjugate (PCV13) vaccine. One dose is recommended after age 68. Pneumococcal polysaccharide (PPSV23) vaccine. One dose is recommended after age 13. Talk to your health care provider about which screenings and vaccines you need and how often you need them. This information is not intended to replace advice given to you by your health care provider. Make sure you discuss any questions you have with your health care provider. Document Released: 12/18/2015 Document Revised: 08/10/2016 Document Reviewed: 09/22/2015 Elsevier Interactive Patient Education  2017 [employer] Prevention in the Home Falls can cause injuries. They can happen to people of all ages. There are many things you can do to make your home safe and to help prevent falls. What can I do on the outside of my home? Regularly fix the edges of walkways and driveways and fix any cracks. Remove anything that might make you trip as you walk through a door, such as a raised step or threshold. Trim any bushes or trees on the path to your home. Use bright outdoor lighting. Clear any walking paths of anything that might make someone trip, such as rocks or tools. Regularly check to see if handrails are loose or broken. Make sure that both sides of any steps have handrails. Any raised decks and porches should have guardrails on the edges. Have any leaves, snow, or ice cleared regularly. Use sand or salt on walking paths during winter. Clean up any spills in your garage right away. This includes oil or grease spills. What can I do in the bathroom? Use night lights. Install grab bars by the toilet and in the tub and shower. Do not use towel bars as grab bars. Use non-skid mats or decals in the tub or  shower. If you need to sit down in the shower, use a plastic, non-slip stool. Keep the floor dry. Clean up any water that spills on the floor as soon as it happens. Remove soap buildup in the tub or shower regularly. Attach bath mats securely with double-sided non-slip rug tape. Do not have throw rugs and other things on the floor that can make you trip. What can I do in the bedroom? Use night lights. Make sure that you have a light by your bed that is easy to reach. Do not use any sheets or blankets that are too big for your bed. They should not hang down onto the floor. Have a firm chair that has side arms. You can use this for support while you get dressed. Do not have throw rugs and other things on the floor that can make you trip. What can I do in the kitchen? Clean up any spills right away. Avoid walking on wet floors. Keep items that you use a lot in easy-to-reach places. If you need to reach something above you, use a strong step stool that has a  grab bar. Keep electrical cords out of the way. Do not use floor polish or wax that makes floors slippery. If you must use wax, use non-skid floor wax. Do not have throw rugs and other things on the floor that can make you trip. What can I do with my stairs? Do not leave any items on the stairs. Make sure that there are handrails on both sides of the stairs and use them. Fix handrails that are broken or loose. Make sure that handrails are as long as the stairways. Check any carpeting to make sure that it is firmly attached to the stairs. Fix any carpet that is loose or worn. Avoid having throw rugs at the top or bottom of the stairs. If you do have throw rugs, attach them to the floor with carpet tape. Make sure that you have a light switch at the top of the stairs and the bottom of the stairs. If you do not have them, ask someone to add them for you. What else can I do to help prevent falls? Wear shoes that: Do not have high heels. Have  rubber bottoms. Are comfortable and fit you well. Are closed at the toe. Do not wear sandals. If you use a stepladder: Make sure that it is fully opened. Do not climb a closed stepladder. Make sure that both sides of the stepladder are locked into place. Ask someone to hold it for you, if possible. Clearly mark and make sure that you can see: Any grab bars or handrails. First and last steps. Where the edge of each step is. Use tools that help you move around (mobility aids) if they are needed. These include: Canes. Walkers. Scooters. Crutches. Turn on the lights when you go into a dark area. Replace any light bulbs as soon as they burn out. Set up your furniture so you have a clear path. Avoid moving your furniture around. If any of your floors are uneven, fix them. If there are any pets around you, be aware of where they are. Review your medicines with your doctor. Some medicines can make you feel dizzy. This can increase your chance of falling. Ask your doctor what other things that you can do to help prevent falls. This information is not intended to replace advice given to you by your health care provider. Make sure you discuss any questions you have with your health care provider. Document Released: 09/17/2009 Document Revised: 04/28/2016 Document Reviewed: 12/26/2014 Elsevier Interactive Patient Education  2017 [employer].

## 2023-02-06 NOTE — Assessment & Plan Note (Signed)
Acute and improved at this time.  Continue current inhaler regimen and we will adjust as needed if more frequent exacerbations present.

## 2023-02-20 ENCOUNTER — Encounter: Payer: Self-pay | Admitting: Nurse Practitioner

## 2023-02-21 ENCOUNTER — Encounter: Payer: Self-pay | Admitting: Unknown Physician Specialty

## 2023-02-21 ENCOUNTER — Ambulatory Visit: Payer: Medicare PPO | Admitting: Unknown Physician Specialty

## 2023-02-21 VITALS — BP 124/72 | HR 76 | Temp 98.4°F | Ht 60.0 in | Wt 122.4 lb

## 2023-02-21 DIAGNOSIS — J019 Acute sinusitis, unspecified: Secondary | ICD-10-CM | POA: Diagnosis not present

## 2023-02-21 DIAGNOSIS — J441 Chronic obstructive pulmonary disease with (acute) exacerbation: Secondary | ICD-10-CM

## 2023-02-21 DIAGNOSIS — H1031 Unspecified acute conjunctivitis, right eye: Secondary | ICD-10-CM | POA: Diagnosis not present

## 2023-02-21 MED ORDER — DOXYCYCLINE HYCLATE 100 MG PO TABS: 100.0000 mg | ORAL_TABLET | Freq: Two times a day (BID) | ORAL | 0 refills | Status: DC

## 2023-02-21 MED ORDER — PREDNISONE 20 MG PO TABS: 40.0000 mg | ORAL_TABLET | Freq: Every day | ORAL | 0 refills | Status: DC

## 2023-02-21 MED ORDER — GUAIFENESIN-CODEINE 100-10 MG/5ML PO SOLN: 10.0000 mL | Freq: Three times a day (TID) | ORAL | 0 refills | Status: DC | PRN

## 2023-02-21 MED ORDER — POLYMYXIN B-TRIMETHOPRIM 10000-0.1 UNIT/ML-% OP SOLN: 1.0000 [drp] | OPHTHALMIC | 0 refills | Status: DC

## 2023-02-21 NOTE — Progress Notes (Signed)
BP 124/72   Pulse 76   Temp 98.4 F (36.9 C) (Oral)   Ht 5' (1.524 m)   Wt 122 lb 6.4 oz (55.5 kg)   SpO2 97%   BMI 23.90 kg/m    Subjective:    Patient ID: Shelly Bridges, female    DOB: [birth year], 77 y.o.   MRN: [id number]  HPI: Shelly Bridges is a 77 y.o. female  Chief Complaint  Patient presents with   Cough    Cough worsening since last visit on 3/4, patient has now lost voice for past week.   Cough: Was seen 3/4 and diagnosed with a COPD flare.  She is continuing with a persistent cough.  States it is a dry cough "all the time."  She can't sleep at night.  Slept for a "little bit" with Tussionex last night.  Tessalon perles didn't work.  Taking Stiolo inhaler 2 puffs once a day which helps.  She is not taking the Albuterol or nebulizer for the last few days.    She feels she is worn out with having to care for grandchildren.    Left eye read and teary.  Stuck together in the AM.   Reviewed last note and chest x-ray which was normal.    Relevant past medical, surgical, family and social history reviewed and updated as indicated. Interim medical history since our last visit reviewed. Allergies and medications reviewed and updated.  Review of Systems  Constitutional:  Positive for activity change and fatigue. Negative for chills and fever.  HENT:  Positive for congestion. Negative for sinus pressure and sinus pain.   Respiratory:  Positive for cough. Negative for shortness of breath and wheezing.   Neurological:  Positive for weakness. Negative for dizziness, light-headedness and headaches.    Per HPI unless specifically indicated above     Objective:    BP 124/72   Pulse 76   Temp 98.4 F (36.9 C) (Oral)   Ht 5' (1.524 m)   Wt 122 lb 6.4 oz (55.5 kg)   SpO2 97%   BMI 23.90 kg/m   Wt Readings from Last 3 Encounters:  02/21/23 122 lb 6.4 oz (55.5 kg)  02/06/23 125 lb 4.8 oz (56.8 kg)  02/06/23 123 lb (55.8 kg)    Physical  Exam Constitutional:      General: She is not in acute distress.    Appearance: Normal appearance. She is well-developed.  HENT:     Head: Normocephalic and atraumatic.     Right Ear: Tympanic membrane and ear canal normal.     Left Ear: Tympanic membrane and ear canal normal.     Nose: No rhinorrhea.     Right Sinus: Maxillary sinus tenderness present. No frontal sinus tenderness.     Left Sinus: Maxillary sinus tenderness present. No frontal sinus tenderness.  Eyes:     General: Lids are normal. No scleral icterus.       Right eye: No discharge.        Left eye: Discharge present.    Conjunctiva/sclera:     Left eye: Left conjunctiva is injected. Exudate present.  Cardiovascular:     Rate and Rhythm: Normal rate and regular rhythm.  Pulmonary:     Effort: Pulmonary effort is normal. No respiratory distress.     Breath sounds: Normal breath sounds.  Abdominal:     Palpations: There is no hepatomegaly or splenomegaly.  Musculoskeletal:        General: Normal range of  motion.  Skin:    Coloration: Skin is not pale.     Findings: No rash.  Neurological:     Mental Status: She is alert and oriented to person, place, and time.  Psychiatric:        Behavior: Behavior normal.        Thought Content: Thought content normal.        Judgment: Judgment normal.     Results for orders placed or performed in visit on 01/16/23  Comprehensive metabolic panel  Result Value Ref Range   Glucose 80 70 - 99 mg/dL   BUN 8 8 - 27 mg/dL   Creatinine, Ser 0.77 0.57 - 1.00 mg/dL   eGFR 80 >59 mL/min/1.73   BUN/Creatinine Ratio 10 (L) 12 - 28   Sodium 136 134 - 144 mmol/L   Potassium 4.2 3.5 - 5.2 mmol/L   Chloride 98 96 - 106 mmol/L   CO2 25 20 - 29 mmol/L   Calcium 9.3 8.7 - 10.3 mg/dL   Total Protein 6.8 6.0 - 8.5 g/dL   Albumin 4.1 3.8 - 4.8 g/dL   Globulin, Total 2.7 1.5 - 4.5 g/dL   Albumin/Globulin Ratio 1.5 1.2 - 2.2   Bilirubin Total 0.5 0.0 - 1.2 mg/dL   Alkaline Phosphatase  96 44 - 121 IU/L   AST 17 0 - 40 IU/L   ALT 9 0 - 32 IU/L  Lipid Panel w/o Chol/HDL Ratio  Result Value Ref Range   Cholesterol, Total 134 100 - 199 mg/dL   Triglycerides 66 0 - 149 mg/dL   HDL 63 >39 mg/dL   VLDL Cholesterol Cal 14 5 - 40 mg/dL   LDL Chol Calc (NIH) 57 0 - 99 mg/dL      Assessment & Plan:   Problem List Items Addressed This Visit       Unprioritized   COPD exacerbation (HCC)    Persistent cough with normal x-ray.  I believe the cough is related to post nasal drip.  Treat for sinusitis with Doxycycline 100 mg BID.  Rx for Robitussin with Codeine and steroid burst of 40 mg daily for 5 days.        Relevant Medications   predniSONE (DELTASONE) 20 MG tablet   guaiFENesin-codeine 100-10 MG/5ML syrup   Other Visit Diagnoses     Acute non-recurrent sinusitis, unspecified location    -  Primary   Polytrim 1 gtt q 4 hours while awake and treat 2 days past the redness   Relevant Medications   doxycycline (VIBRA-TABS) 100 MG tablet   predniSONE (DELTASONE) 20 MG tablet   guaiFENesin-codeine 100-10 MG/5ML syrup   Acute bacterial conjunctivitis of right eye            Follow up plan: Return if symptoms worsen or fail to improve.

## 2023-02-21 NOTE — Assessment & Plan Note (Signed)
Persistent cough with normal x-ray.  I believe the cough is related to post nasal drip.  Treat for sinusitis with Doxycycline 100 mg BID.  Rx for Robitussin with Codeine and steroid burst of 40 mg daily for 5 days.

## 2023-02-24 DIAGNOSIS — H1045 Other chronic allergic conjunctivitis: Secondary | ICD-10-CM | POA: Diagnosis not present

## 2023-02-24 DIAGNOSIS — H43812 Vitreous degeneration, left eye: Secondary | ICD-10-CM | POA: Diagnosis not present

## 2023-02-24 DIAGNOSIS — H353131 Nonexudative age-related macular degeneration, bilateral, early dry stage: Secondary | ICD-10-CM | POA: Diagnosis not present

## 2023-03-06 ENCOUNTER — Other Ambulatory Visit: Payer: Self-pay | Admitting: Nurse Practitioner

## 2023-03-07 NOTE — Telephone Encounter (Signed)
Requested Prescriptions  Refused Prescriptions Disp Refills   busPIRone (BUSPAR) 5 MG tablet [Pharmacy Med Name: BUSPIRONE 5MG  TABLETS] 180 tablet 4    Sig: TAKE 1 TABLET(5 MG) BY MOUTH TWICE DAILY AS NEEDED     Psychiatry: Anxiolytics/Hypnotics - Non-controlled Passed - 03/06/2023  9:54 AM      Passed - Valid encounter within last 12 months    Recent Outpatient Visits           2 weeks ago Acute non-recurrent sinusitis, unspecified location   [place] Kathrine Haddock, NP   4 weeks ago COPD exacerbation [facility])   [place], Henrine Screws T, NP   1 month ago COPD exacerbation [facility])   [place], Henrine Screws T, NP   1 month ago Simple chronic bronchitis ([place])   [place], Henrine Screws T, NP   6 months ago Simple chronic bronchitis ([place])   [place], Barbaraann Faster, NP       Future Appointments             In 4 months [place], Barbaraann Faster, NP [place]

## 2023-03-28 ENCOUNTER — Ambulatory Visit: Payer: Self-pay

## 2023-03-28 NOTE — Telephone Encounter (Signed)
Called and scheduled appointment on 03/29/2023 @ 10:20 am.

## 2023-03-28 NOTE — Telephone Encounter (Signed)
      Chief Complaint: Larey Seat last week, tripped in gravel. Left breast has a bruise. Hurts with certain movements and deep breaths. Appointment made for 03/30/23. Asking to be seen sooner if possible. Symptoms: Above Frequency: Last week. Pertinent Negatives: Patient denies  Disposition: ED /[] Urgent Care (no appt availability in office) / Appointment(In office/virtual)/  [facility]/ Home Care/ Refused Recommended Disposition /[] [facility]/  Follow-up with PCP Additional Notes: Please advise pt.  Reason for Disposition  [1] MODERATE pain (e.g., interferes with normal activities) AND [2] high-risk adult (e.g., age > 60 years, osteoporosis, chronic steroid use)  Answer Assessment - Initial Assessment Questions 1. MECHANISM: "How did the injury happen?"     Tripped on gravel 2. ONSET: "When did the injury happen?" (Minutes or hours ago)     Last week 3. LOCATION: "Where on the chest is the injury located?"     Bruise on left breast 4. APPEARANCE: "What does the injury look like?"     Bruise 5. BLEEDING: "Is there any bleeding now? If Yes, ask: How long has it been bleeding?"     No 6. SEVERITY: "Any difficulty with breathing?"     No, hurts with deep breath 7. SIZE: For cuts, bruises, or swelling, ask: "How large is it?" (e.g., inches or centimeters)     None 8. PAIN: "Is there pain?" If Yes, ask: "How bad is the pain?"   (e.g., Scale 1-10; or mild, moderate, severe)     With cough 5 9. TETANUS: For any breaks in the skin, ask: "When was the last tetanus booster?"     Unsure 10. PREGNANCY: "Is there any chance you are pregnant?" "When was your last menstrual period?"       No  Protocols used: Chest Injury-A-AH

## 2023-03-29 ENCOUNTER — Ambulatory Visit
Admission: RE | Admit: 2023-03-29 | Discharge: 2023-03-29 | Disposition: A | Discharge status: Home / Self Care | Payer: Medicare PPO | Source: Home / Self Care | Attending: Nurse Practitioner | Admitting: Nurse Practitioner

## 2023-03-29 ENCOUNTER — Other Ambulatory Visit: Payer: Self-pay | Admitting: Nurse Practitioner

## 2023-03-29 ENCOUNTER — Ambulatory Visit
Admission: RE | Admit: 2023-03-29 | Discharge: 2023-03-29 | Disposition: A | Discharge status: Home / Self Care | Payer: Medicare PPO | Source: Ambulatory Visit | Attending: Nurse Practitioner | Admitting: Nurse Practitioner

## 2023-03-29 ENCOUNTER — Encounter: Payer: Self-pay | Admitting: Nurse Practitioner

## 2023-03-29 ENCOUNTER — Ambulatory Visit: Payer: Medicare PPO | Admitting: Nurse Practitioner

## 2023-03-29 VITALS — BP 149/66 | HR 67 | Temp 97.9°F | Ht 60.0 in | Wt 127.5 lb

## 2023-03-29 DIAGNOSIS — R0781 Pleurodynia: Secondary | ICD-10-CM

## 2023-03-29 DIAGNOSIS — R079 Chest pain, unspecified: Secondary | ICD-10-CM | POA: Diagnosis not present

## 2023-03-29 DIAGNOSIS — S299XXA Unspecified injury of thorax, initial encounter: Secondary | ICD-10-CM | POA: Diagnosis not present

## 2023-03-29 DIAGNOSIS — R059 Cough, unspecified: Secondary | ICD-10-CM | POA: Diagnosis not present

## 2023-03-29 MED ORDER — HYDROCODONE-ACETAMINOPHEN 5-325 MG PO TABS: 1.0000 | ORAL_TABLET | Freq: Four times a day (QID) | ORAL | 0 refills | Status: AC | PRN

## 2023-03-29 MED ORDER — LIDOCAINE 5 % EX PTCH: 1.0000 | MEDICATED_PATCH | CUTANEOUS | 0 refills | Status: DC

## 2023-03-29 NOTE — Progress Notes (Signed)
BP (!) 149/66   Pulse 67   Temp 97.9 F (36.6 C) (Oral)   Ht 5' (1.524 m)   Wt 127 lb 8 oz (57.8 kg)   SpO2 96%   BMI 24.90 kg/m    Subjective:    Patient ID: Shelly Bridges, female    DOB: 10-02-1946, 77 y.o.   MRN: [id number]  HPI: Shelly Bridges is a 77 y.o. female  Chief Complaint  Patient presents with   Genia Hotter about a week ago, left chest pain started a few days ago.   RIB/CHEST PAIN One week ago had a fall, they were remodeling friends apartment. They went over to see this, she fell straight forward onto gravel and cement area.  Hit directly face down onto chest area.  Has a small bruise on left breast.  No LOC or trauma to head. Duration: days Mechanism of injury:  as above fall Location: anterior around upper chest Onset: gradual  Severity: 10/10 when coughs Quality: sharp, aching, and throbbing Frequency: intermittent Radiation: no Aggravating factors: coughing aggravates, taking a deep breath on occasion, and occasionally with turning side to side   Alleviating factors: nothing  Status: fluctuating Treatments attempted: none   Relief with NSAIDs?: No NSAIDs Taken Swelling: no Redness:no Fevers: no   Relevant past medical, surgical, family and social history reviewed and updated as indicated. Interim medical history since our last visit reviewed. Allergies and medications reviewed and updated.  Review of Systems  Constitutional:  Negative for activity change, appetite change, diaphoresis, fatigue and fever.  Respiratory:  Negative for cough, chest tightness, shortness of breath and wheezing.   Cardiovascular:  Negative for chest pain, palpitations and leg swelling.  Gastrointestinal: Negative.   Musculoskeletal:  Positive for arthralgias.  Neurological: Negative.   Psychiatric/Behavioral: Negative.      Per HPI unless specifically indicated above     Objective:    BP (!) 149/66   Pulse 67   Temp 97.9 F (36.6 C) (Oral)    Ht 5' (1.524 m)   Wt 127 lb 8 oz (57.8 kg)   SpO2 96%   BMI 24.90 kg/m   Wt Readings from Last 3 Encounters:  03/29/23 127 lb 8 oz (57.8 kg)  02/21/23 122 lb 6.4 oz (55.5 kg)  02/06/23 125 lb 4.8 oz (56.8 kg)    Physical Exam Vitals and nursing note reviewed.  Constitutional:      General: She is awake. She is not in acute distress.    Appearance: She is well-developed and well-groomed. She is not ill-appearing or toxic-appearing.  HENT:     Head: Normocephalic.     Right Ear: Hearing and external ear normal.     Left Ear: Hearing and external ear normal.  Eyes:     General: Lids are normal.        Right eye: No discharge.        Left eye: No discharge.     Conjunctiva/sclera: Conjunctivae normal.     Pupils: Pupils are equal, round, and reactive to light.  Neck:     Thyroid: No thyromegaly.     Vascular: No carotid bruit or JVD.  Cardiovascular:     Rate and Rhythm: Normal rate and regular rhythm.     Heart sounds: Normal heart sounds. No murmur heard.    No gallop.  Pulmonary:     Effort: Pulmonary effort is normal. No accessory muscle usage or respiratory distress.     Breath  sounds: Normal breath sounds. No decreased breath sounds, wheezing or rhonchi.     Comments: Small bruise to left breast and tenderness to left breast near bruise and xyphoid area.  When coughing she has significant tenderness and grimacing noted to xyphoid and left upper rib area.   Abdominal:     General: Bowel sounds are normal.     Palpations: Abdomen is soft. There is no hepatomegaly or splenomegaly.  Musculoskeletal:     Cervical back: Normal range of motion and neck supple.     Right lower leg: No edema.     Left lower leg: No edema.  Lymphadenopathy:     Head:     Right side of head: No submental, submandibular, tonsillar, preauricular or posterior auricular adenopathy.     Left side of head: No submental, submandibular, tonsillar, preauricular or posterior auricular adenopathy.      Cervical: No cervical adenopathy.  Skin:    General: Skin is warm and dry.  Neurological:     Mental Status: She is alert and oriented to person, place, and time.  Psychiatric:        Attention and Perception: Attention normal.        Mood and Affect: Mood normal.        Behavior: Behavior normal. Behavior is cooperative.        Thought Content: Thought content normal.        Judgment: Judgment normal.     Results for orders placed or performed in visit on 01/16/23  Comprehensive metabolic panel  Result Value Ref Range   Glucose 80 70 - 99 mg/dL   BUN 8 8 - 27 mg/dL   Creatinine, Ser 8.11 0.57 - 1.00 mg/dL   eGFR 80 >91 YN/WGN/5.62   BUN/Creatinine Ratio 10 (L) 12 - 28   Sodium 136 134 - 144 mmol/L   Potassium 4.2 3.5 - 5.2 mmol/L   Chloride 98 96 - 106 mmol/L   CO2 25 20 - 29 mmol/L   Calcium 9.3 8.7 - 10.3 mg/dL   Total Protein 6.8 6.0 - 8.5 g/dL   Albumin 4.1 3.8 - 4.8 g/dL   Globulin, Total 2.7 1.5 - 4.5 g/dL   Albumin/Globulin Ratio 1.5 1.2 - 2.2   Bilirubin Total 0.5 0.0 - 1.2 mg/dL   Alkaline Phosphatase 96 44 - 121 IU/L   AST 17 0 - 40 IU/L   ALT 9 0 - 32 IU/L  Lipid Panel w/o Chol/HDL Ratio  Result Value Ref Range   Cholesterol, Total 134 100 - 199 mg/dL   Triglycerides 66 0 - 149 mg/dL   HDL 63 >13 mg/dL   VLDL Cholesterol Cal 14 5 - 40 mg/dL   LDL Chol Calc (NIH) 57 0 - 99 mg/dL      Assessment & Plan:   Problem List Items Addressed This Visit       Other   Rib pain - Primary    Acute since recent fall to upper left rib area.  Will obtain imaging.  Recommend she place firm pillow or push firmly with hands to support upper chest when coughing and reduce pain.  Ensure deep breathing exercised 6 times a day to open lungs and help prevent PNA.  Scripts for Lidocaine patches to use and Norco short burst for pain. Recommend heating pad, Tylenol, and Voltaren gel as needed at home.  Return in one week.      Relevant Orders   DG Chest 2 View  Follow up  plan: Return in about 1 week (around 04/05/2023) for RIB PAIN.

## 2023-03-29 NOTE — Patient Instructions (Signed)
Tylenol as needed + Voltaren gel to area as needed.  Rib Fracture  A rib fracture is a break or crack in one of the bones of the ribs. The ribs are like a cage that goes around your upper chest. A broken or cracked rib is often painful, but most do not cause other problems. Most rib fractures usually heal on their own in 1-3 months. What are the causes? Doing movements over and over again with a lot of force, such as pitching a baseball or having a very bad cough. A direct hit to the chest. Cancer that has spread to the bones. What are the signs or symptoms? Pain when you breathe in or cough. Pain when someone presses on the injured area. Feeling short of breath. How is this treated? Treatment depends on how bad the fracture is. In general: Most rib fractures usually heal on their own in 1-3 months. Healing may take longer if you have a cough or are doing activities that make the injury worse. While you heal, you may be given medicines to control pain. You will also be taught deep breathing exercises. Very bad injuries may require a stay at the hospital or surgery. Follow these instructions at home: Managing pain, stiffness, and swelling If told, put ice on the injured area. To do this: Put ice in a plastic bag. Place a towel between your skin and the bag. Leave the ice on for 20 minutes, 2-3 times a day. Take off the ice if your skin turns bright red. This is very important. If you cannot feel pain, heat, or cold, you have a greater risk of damage to the area. Take over-the-counter and prescription medicines only as told by your doctor. Activity Avoid activities that cause pain to the injured area. Protect your injured area. Slowly increase activity as told by your doctor. General instructions Do deep breathing exercises as told by your doctor. You may be told to: Take deep breaths many times a day. Cough several times a day while hugging a pillow. Use a device (incentive  spirometer) to do deep breathing many times a day. Drink enough fluid to keep your pee (urine) clear or pale yellow. Do not wear a rib belt or binder. Keep all follow-up visits. Contact a doctor if: You have a fever. Get help right away if: You have trouble breathing. You are short of breath. You cannot stop coughing. You cough up thick or bloody spit. You feel like you may vomit (nauseous), vomit, or have belly (abdominal) pain. Your pain gets worse and medicine does not help. These symptoms may be an emergency. Get help right away. Call your local emergency services (911 in the [place] Do not wait to see if the symptoms will go away. Do not drive yourself to the hospital. Summary A rib fracture is a break or crack in one of the bones of the ribs. Apply ice to the injured area and take medicines for pain as told by your doctor. Take deep breaths and cough several times a day. Hug a pillow every time you cough. This information is not intended to replace advice given to you by your health care provider. Make sure you discuss any questions you have with your health care provider. Document Revised: 03/13/2020 Document Reviewed: 03/13/2020 Elsevier Patient Education  2023 [employer].

## 2023-03-29 NOTE — Assessment & Plan Note (Signed)
Acute since recent fall to upper left rib area.  Will obtain imaging.  Recommend she place firm pillow or push firmly with hands to support upper chest when coughing and reduce pain.  Ensure deep breathing exercised 6 times a day to open lungs and help prevent PNA.  Scripts for Lidocaine patches to use and Norco short burst for pain. Recommend heating pad, Tylenol, and Voltaren gel as needed at home.  Return in one week.

## 2023-03-30 ENCOUNTER — Ambulatory Visit: Payer: Medicare PPO | Admitting: Family Medicine

## 2023-03-31 ENCOUNTER — Telehealth: Payer: Self-pay

## 2023-03-31 ENCOUNTER — Encounter: Payer: Self-pay | Admitting: Nurse Practitioner

## 2023-03-31 NOTE — Telephone Encounter (Signed)
PA started for Lidocaine 5% patches through [employer]. Awaiting on determination  

## 2023-04-02 ENCOUNTER — Other Ambulatory Visit: Payer: Self-pay | Admitting: Nurse Practitioner

## 2023-04-02 MED ORDER — FUROSEMIDE 20 MG PO TABS: 20.0000 mg | ORAL_TABLET | Freq: Every day | ORAL | 0 refills | Status: DC

## 2023-04-02 NOTE — Patient Instructions (Incomplete)
Pulmonary Edema  Pulmonary edema is a condition in which fluid collects in the air sacs of the lung. This makes it hard for the lungs to fill with air. It also prevents the lungs from moving oxygen into the bloodstream, which can affect other organs, such as the brain and kidneys. Pulmonary edema is an emergency and should be treated immediately. There are two main types of pulmonary edema: Cardiogenic. This means the pulmonary edema was caused by a problem with the heart. Noncardiogenic. This means the pulmonary edema was caused by something other than the heart, such as an injury to the lung. What are the causes? This condition is commonly caused by heart failure. When this happens, the heart is not able to properly pump blood through the body. This can lead to increased pressure in the heart and blood building up in the veins around the lungs. When blood builds up in these veins, fluid gets pushed into the air sacs of the lung. Heart failure may be caused by: Coronary artery disease. High blood pressure. Viral infection of the heart (myocarditis). Leaky or stiff heart valves. Irregular heartbeat (arrhythmia). Fluid buildup caused by kidney problems. Other causes of pulmonary edema include: Infection in the lungs (pneumonia), blood (sepsis), or other parts of the body. Severe injury to the chest. Lung injury from heat or toxins, such as breathing in smoke or poisonous gas. Inhaling vomit or water (pulmonaryaspiration). Certain medicines. High altitude. What are the signs or symptoms? Symptoms of this condition include: Shortness of breath. Coughing with frothy or bloody mucus. Making high-pitched whistling sounds when you breathe, most often when you breathe out (wheezing). Feeling like you cannot get enough air. Shallow and fast breathing. Skin that is cool and damp, and has a pale or bluish color. How is this diagnosed? This condition is diagnosed based on: Your medical history. A  physical exam. Your symptoms. You may also have tests, including: Chest X-ray. Chest CT scan. Blood tests, including checking the amount of oxygen in the blood. Electrocardiogram. This measures the electrical signals of the heart. Echocardiogram. This uses an ultrasound to evaluate the health of the heart. Coronary angiogram. This uses an X-ray to see the heart's blood vessels. How is this treated? Initial treatment for this condition focuses on relieving your symptoms. Treatment depends on the underlying cause of the condition and may include: Oxygen therapy. The oxygen may be given through tubes in your nose or through a face mask. In severe cases, a breathing tube is inserted into the windpipe and hooked up to a breathing machine (ventilator). Medicines. These may include medicines to: Help the body get rid of extra water (diuretics). Help the heart pump blood properly. Prevent or destroy blood clots. If poor heart function is the cause, treatment may also include: Procedures to open blocked arteries, repair damaged heart valves, or remove some of the damaged heart muscle. A pacemaker to help with heart function. A procedure that uses electric shocks to regulate heart rate (cardioversion). If an infection is the cause, treatment may include antibiotic medicines. Follow these instructions at home: Medicines Take over-the-counter and prescription medicines only as told by your health care provider. If you were prescribed an antibiotic, take it as told by your health care provider. Do not stop taking the antibiotic even if you start to feel better. Have a plan with information about each medicine you take. This should include: Why you take the medicine. Possible side effects. Best time of day to take it. Foods  to take with it, or foods to avoid when taking it. When to call your health care provider. Make a list of each medicine, vitamin, or herbal supplement you take. Keep the list  with you at all times. Show it to your health care provider at each visit and before starting a new medicine. Update the list as you add or stop medicines. Lifestyle  Exercise regularly as told by your health care provider. It is important to do it safely. You can do this by: Pacing your activities to avoid shortness of breath or chest pain. Resting for at least 1 hour before and after meals. Asking about cardiac rehabilitation programs. These may include education, exercise plans, and counseling. Eat a heart-healthy diet that is low in salt (sodium), saturated fat, and cholesterol. Your health care provider may recommend foods that are high in fiber, such as fresh fruits and vegetables, whole grains, and beans. Do not use any products that contain nicotine or tobacco. These products include cigarettes, chewing tobacco, and vaping devices, such as e-cigarettes. If you need help quitting, ask your health care provider. General instructions Maintain a healthy weight. Keep a record of your weight: Record your hospital or clinic weight. When you get home, compare it to your scale and record your weight. Weigh yourself first thing each morning after you urinate and before you eat breakfast. Wear the same amount of clothing each time. Record the weights. Share your weight record with your health care provider. Daily weights are important in detecting the body's retention of excess fluid. Tell your health care provider right away if you gain weight quickly. Your medicines may need to be adjusted. Check and record your blood pressure as often as told by your health care provider. Bring the records with you to clinic visits. Consider therapy or joining a support group. This may help with any stress, fear, or anxiety. Keep all follow-up visits. This is important. Contact a health care provider if: You have a dry, hacking cough. You are gaining weight gradually. Your shortness of breath gets worse with  activity. You have more swelling in your hands, feet, ankles, or abdomen. You have difficulty sleeping because it is hard to breathe. Your blood pressure is higher than 180/120. Get help right away if: You gain weight quickly--more than 2-3 lb (1-1.4 kg) in 24 hours or more than 5 lb (2.3 kg) in a week. You have severe chest pain, especially if the pain is crushing or pressure-like and spreads to the arms, back, neck, or jaw. You have unusual sweating or your skin turns blue or pale. Your shortness of breath happens without any activity. You have new or worsened dizziness, blurred vision, headache, or unsteadiness. You are coughing frequently or cough up bloody mucus (sputum). You feel a racing heartbeat (palpitations). You have anxiety or a feeling that you cannot get enough air. You cannot breathe when lying flat. Your blood pressure is higher than 180/120 and you have any of the above symptoms. These symptoms may represent a serious problem that is an emergency. Do not wait to see if the symptoms will go away. Get medical help right away. Call your local emergency services (911 in the [place] Do not drive yourself to the hospital. Summary Pulmonary edema is a condition in which fluid collects in the air sacs of the lungs. If left untreated, it can lead to a medical emergency. This condition is most commonly caused by heart failure. Other causes can include infections or injury to  the lungs. Take over-the-counter and prescription medicines only as told by your health care provider. This information is not intended to replace advice given to you by your health care provider. Make sure you discuss any questions you have with your health care provider. Document Revised: 10/13/2020 Document Reviewed: 10/13/2020 Elsevier Patient Education  2023 [employer].

## 2023-04-02 NOTE — Progress Notes (Signed)
Contacted via MyChart   Good evening Shelly Bridges, your imaging has returned and no fractures are noticed!! [employer].  However, heart is mildly enlarged and there is some what we call pulmonary edema, build up of fluid, in lungs.  For this I am going to send in some Lasix for 5 days (a fluid pill, you will pee more), 20 MG, and we will see how you sound and feel on 04/05/23.  We may need to consider getting echocardiogram of the heart in future to further check on this.  Any questions? Keep being amazing!!  Thank you for allowing me to participate in your care.  I appreciate you. Kindest regards, Zyon Rosser

## 2023-04-05 ENCOUNTER — Ambulatory Visit: Payer: Medicare PPO | Admitting: Nurse Practitioner

## 2023-04-05 ENCOUNTER — Encounter: Payer: Self-pay | Admitting: Nurse Practitioner

## 2023-04-05 VITALS — BP 132/68 | HR 64 | Temp 97.6°F | Ht 60.0 in | Wt 127.5 lb

## 2023-04-05 DIAGNOSIS — I517 Cardiomegaly: Secondary | ICD-10-CM | POA: Diagnosis not present

## 2023-04-05 DIAGNOSIS — R0781 Pleurodynia: Secondary | ICD-10-CM

## 2023-04-05 NOTE — Progress Notes (Signed)
BP 132/68 (BP Location: Left Arm, Patient Position: Sitting)   Pulse 64   Temp 97.6 F (36.4 C) (Oral)   Ht 5' (1.524 m)   Wt 127 lb 8 oz (57.8 kg)   SpO2 96%   BMI 24.90 kg/m    Subjective:    Patient ID: Shelly Bridges, female    DOB: Jul 03, 1946, 77 y.o.   MRN: [id number]  HPI: Shelly Bridges is a 77 y.o. female  Chief Complaint  Patient presents with   Chest Pain    Patient states pain is much better but a little bit still there    RIB/CHEST PAIN Follow-up today for rib pain seen on 03/29/23.  She reports feeling a lot better, mildly sore still + has a small lump to inner left breast under where bruise was.  Had imaging on 03/29/23 which noted no rib fractures, but did not mild cardiomegaly, some mild fluid on review images.   Was provided with Lasix 20 MG daily for 5 days + pain medication + Lidocaine patches.  History: Two weeks ago had a fall, they were remodeling friends apartment. They went over to see this, she fell straight forward onto gravel and cement area.  Hit directly face down onto chest area.  Has a small bruise on left breast.  No LOC or trauma to head. Duration: days Mechanism of injury:  as above fall Location: anterior around upper chest Onset: gradual  Severity: occasional 1-2/10 pain with bending over Quality: dull and aching Frequency: intermittent Radiation: no Aggravating factors: coughing aggravates, taking a deep breath on occasion, and occasionally with bending Alleviating factors: nothing  Status: fluctuating Treatments attempted: none   Relief with NSAIDs?: No NSAIDs Taken Swelling: no Redness:no Fevers: no   Relevant past medical, surgical, family and social history reviewed and updated as indicated. Interim medical history since our last visit reviewed. Allergies and medications reviewed and updated.  Review of Systems  Constitutional:  Negative for activity change, appetite change, diaphoresis, fatigue and fever.   Respiratory:  Negative for cough, chest tightness, shortness of breath and wheezing.   Cardiovascular:  Negative for chest pain, palpitations and leg swelling.  Gastrointestinal: Negative.   Musculoskeletal:  Positive for arthralgias.  Neurological: Negative.   Psychiatric/Behavioral: Negative.      Per HPI unless specifically indicated above     Objective:    BP 132/68 (BP Location: Left Arm, Patient Position: Sitting)   Pulse 64   Temp 97.6 F (36.4 C) (Oral)   Ht 5' (1.524 m)   Wt 127 lb 8 oz (57.8 kg)   SpO2 96%   BMI 24.90 kg/m   Wt Readings from Last 3 Encounters:  04/05/23 127 lb 8 oz (57.8 kg)  03/29/23 127 lb 8 oz (57.8 kg)  02/21/23 122 lb 6.4 oz (55.5 kg)    Physical Exam Vitals and nursing note reviewed.  Constitutional:      General: She is awake. She is not in acute distress.    Appearance: She is well-developed and well-groomed. She is not ill-appearing or toxic-appearing.  HENT:     Head: Normocephalic.     Right Ear: Hearing and external ear normal.     Left Ear: Hearing and external ear normal.  Eyes:     General: Lids are normal.        Right eye: No discharge.        Left eye: No discharge.     Conjunctiva/sclera: Conjunctivae normal.  Pupils: Pupils are equal, round, and reactive to light.  Neck:     Thyroid: No thyromegaly.     Vascular: No carotid bruit or JVD.  Cardiovascular:     Rate and Rhythm: Normal rate and regular rhythm.     Heart sounds: Normal heart sounds. No murmur heard.    No gallop.  Pulmonary:     Effort: Pulmonary effort is normal. No accessory muscle usage or respiratory distress.     Breath sounds: Normal breath sounds. No decreased breath sounds, wheezing or rhonchi.     Comments: Small bruise to left breast which is now light purple/yellow in color.  With coughing pain has reduced.   Chest:    Abdominal:     General: Bowel sounds are normal.     Palpations: Abdomen is soft. There is no hepatomegaly or  splenomegaly.  Musculoskeletal:     Cervical back: Normal range of motion and neck supple.     Right lower leg: No edema.     Left lower leg: No edema.  Lymphadenopathy:     Head:     Right side of head: No submental, submandibular, tonsillar, preauricular or posterior auricular adenopathy.     Left side of head: No submental, submandibular, tonsillar, preauricular or posterior auricular adenopathy.     Cervical: No cervical adenopathy.  Skin:    General: Skin is warm and dry.  Neurological:     Mental Status: She is alert and oriented to person, place, and time.  Psychiatric:        Attention and Perception: Attention normal.        Mood and Affect: Mood normal.        Behavior: Behavior normal. Behavior is cooperative.        Thought Content: Thought content normal.        Judgment: Judgment normal.     Results for orders placed or performed in visit on 01/16/23  Comprehensive metabolic panel  Result Value Ref Range   Glucose 80 70 - 99 mg/dL   BUN 8 8 - 27 mg/dL   Creatinine, Ser 1.61 0.57 - 1.00 mg/dL   eGFR 80 >09 UE/AVW/0.98   BUN/Creatinine Ratio 10 (L) 12 - 28   Sodium 136 134 - 144 mmol/L   Potassium 4.2 3.5 - 5.2 mmol/L   Chloride 98 96 - 106 mmol/L   CO2 25 20 - 29 mmol/L   Calcium 9.3 8.7 - 10.3 mg/dL   Total Protein 6.8 6.0 - 8.5 g/dL   Albumin 4.1 3.8 - 4.8 g/dL   Globulin, Total 2.7 1.5 - 4.5 g/dL   Albumin/Globulin Ratio 1.5 1.2 - 2.2   Bilirubin Total 0.5 0.0 - 1.2 mg/dL   Alkaline Phosphatase 96 44 - 121 IU/L   AST 17 0 - 40 IU/L   ALT 9 0 - 32 IU/L  Lipid Panel w/o Chol/HDL Ratio  Result Value Ref Range   Cholesterol, Total 134 100 - 199 mg/dL   Triglycerides 66 0 - 149 mg/dL   HDL 63 >11 mg/dL   VLDL Cholesterol Cal 14 5 - 40 mg/dL   LDL Chol Calc (NIH) 57 0 - 99 mg/dL      Assessment & Plan:   Problem List Items Addressed This Visit       Cardiovascular and Mediastinum   Cardiomegaly    Noted on recent imaging -- obtain echo to further  assess heart structure.  Discussed with patient at length.  Relevant Orders   ECHOCARDIOGRAM COMPLETE   DG Chest 2 View     Other   Rib pain - Primary    Acute and improving at this time, recommend she complete current course of treatment.  Will repeat imaging in 2 weeks to reassess and plan on echo to further assess mild cardiomegaly.        Relevant Orders   DG Chest 2 View     Follow up plan: Return for As scheduled August 13th.

## 2023-04-05 NOTE — Assessment & Plan Note (Signed)
Noted on recent imaging -- obtain echo to further assess heart structure.  Discussed with patient at length.

## 2023-04-05 NOTE — Assessment & Plan Note (Signed)
Acute and improving at this time, recommend she complete current course of treatment.  Will repeat imaging in 2 weeks to reassess and plan on echo to further assess mild cardiomegaly.

## 2023-04-19 ENCOUNTER — Ambulatory Visit
Admission: RE | Admit: 2023-04-19 | Discharge: 2023-04-19 | Disposition: A | Discharge status: Home / Self Care | Payer: Medicare PPO | Attending: Nurse Practitioner | Admitting: Nurse Practitioner

## 2023-04-19 ENCOUNTER — Ambulatory Visit
Admission: RE | Admit: 2023-04-19 | Discharge: 2023-04-19 | Disposition: A | Discharge status: Home / Self Care | Payer: Medicare PPO | Source: Ambulatory Visit | Attending: Nurse Practitioner | Admitting: Nurse Practitioner

## 2023-04-19 DIAGNOSIS — I517 Cardiomegaly: Secondary | ICD-10-CM | POA: Diagnosis not present

## 2023-04-19 DIAGNOSIS — R0781 Pleurodynia: Secondary | ICD-10-CM

## 2023-04-23 ENCOUNTER — Encounter: Payer: Self-pay | Admitting: Nurse Practitioner

## 2023-04-23 NOTE — Progress Notes (Signed)
Contacted via MyChart   Good morning Shelly Bridges, your imaging had returned and overall is clear -- no pneumonia.  Heart still is slightly enlarged.  As we discussed I do recommend scheduling your echocardiogram.  Have they called you to schedule this?

## 2023-05-18 ENCOUNTER — Ambulatory Visit
Admission: RE | Admit: 2023-05-18 | Discharge: 2023-05-18 | Disposition: A | Discharge status: Home / Self Care | Payer: Medicare PPO | Source: Ambulatory Visit | Attending: Nurse Practitioner | Admitting: Nurse Practitioner

## 2023-05-18 DIAGNOSIS — Z87891 Personal history of nicotine dependence: Secondary | ICD-10-CM | POA: Diagnosis not present

## 2023-05-18 DIAGNOSIS — E785 Hyperlipidemia, unspecified: Secondary | ICD-10-CM | POA: Insufficient documentation

## 2023-05-18 DIAGNOSIS — I119 Hypertensive heart disease without heart failure: Secondary | ICD-10-CM | POA: Diagnosis not present

## 2023-05-18 DIAGNOSIS — I3139 Other pericardial effusion (noninflammatory): Secondary | ICD-10-CM | POA: Insufficient documentation

## 2023-05-18 DIAGNOSIS — I083 Combined rheumatic disorders of mitral, aortic and tricuspid valves: Secondary | ICD-10-CM | POA: Insufficient documentation

## 2023-05-18 DIAGNOSIS — I517 Cardiomegaly: Secondary | ICD-10-CM | POA: Diagnosis not present

## 2023-05-18 LAB — ECHOCARDIOGRAM COMPLETE
AR max vel: 2.84 cm2
AV Area VTI: 2.94 cm2
AV Area mean vel: 2.88 cm2
AV Mean grad: 5 mmHg
AV Peak grad: 10.8 mmHg
Ao pk vel: 1.64 m/s
Area-P 1/2: 2.64 cm2
MV VTI: 2.78 cm2
S' Lateral: 1.8 cm

## 2023-05-18 NOTE — Progress Notes (Signed)
Contacted via MyChart   Good evening Shelly Bridges, your echo has returned: - Overall heart has a good pump with ejection fraction (EF) of 60-65% and the left ventricle is normal. - There is a small amount of fluid in the sac around the heart, which we noted on x-ray too.  If you are having any further cough or shortness of breath I recommend we repeat a round of Lasix for this.  Let me know.  If you are feeling better we will continue to monitor. - There is mild elevation in pulmonary artery pressure, which we can see with lung disease and will monitor closely.  May benefit a visit with lung doctor in future. - You do have a few valves that are a bit leaky -- meaning the flow of blood is back flowing a little due to valve being a bit sluggish -- this is moderate in tricuspid and mitral valve + mild with aortic valve -- in future you may benefit a cardiology visit if any increased shortness of breath or chest pain.  Any questions for me? Keep being awesome!!  Thank you for allowing me to participate in your care.  I appreciate you. Kindest regards, Jinan Biggins

## 2023-05-18 NOTE — Progress Notes (Signed)
*  PRELIMINARY RESULTS* Echocardiogram 2D Echocardiogram has been performed.  Carolyne Fiscal 05/18/2023, 9:53 AM

## 2023-05-21 ENCOUNTER — Encounter: Payer: Self-pay | Admitting: Nurse Practitioner

## 2023-05-22 MED ORDER — FUROSEMIDE 20 MG PO TABS: 20.0000 mg | ORAL_TABLET | Freq: Every day | ORAL | 0 refills | Status: DC

## 2023-07-16 ENCOUNTER — Other Ambulatory Visit: Payer: Self-pay | Admitting: Nurse Practitioner

## 2023-07-18 ENCOUNTER — Other Ambulatory Visit: Payer: Self-pay | Admitting: Nurse Practitioner

## 2023-07-18 ENCOUNTER — Encounter: Payer: Medicare PPO | Admitting: Nurse Practitioner

## 2023-07-18 DIAGNOSIS — R7989 Other specified abnormal findings of blood chemistry: Secondary | ICD-10-CM

## 2023-07-18 DIAGNOSIS — E559 Vitamin D deficiency, unspecified: Secondary | ICD-10-CM

## 2023-07-18 DIAGNOSIS — F419 Anxiety disorder, unspecified: Secondary | ICD-10-CM

## 2023-07-18 DIAGNOSIS — J410 Simple chronic bronchitis: Secondary | ICD-10-CM

## 2023-07-18 DIAGNOSIS — Z0000 Encounter for general adult medical examination without abnormal findings: Secondary | ICD-10-CM

## 2023-07-18 DIAGNOSIS — E7801 Familial hypercholesterolemia: Secondary | ICD-10-CM

## 2023-07-18 DIAGNOSIS — I10 Essential (primary) hypertension: Secondary | ICD-10-CM

## 2023-07-18 DIAGNOSIS — F339 Major depressive disorder, recurrent, unspecified: Secondary | ICD-10-CM

## 2023-07-18 DIAGNOSIS — M85852 Other specified disorders of bone density and structure, left thigh: Secondary | ICD-10-CM

## 2023-07-18 NOTE — Telephone Encounter (Signed)
Requested medication (s) are due for refill today: routing for review  Requested medication (s) are on the active medication list: yes  Last refill:  06/20/22  Future visit scheduled: yes  Notes to clinic: Medication not assigned to a protocol, review manually.       Requested Prescriptions  Pending Prescriptions Disp Refills   STIOLTO RESPIMAT 2.5-2.5 MCG/ACT AERS [Pharmacy Med Name: STIOLTO RESPIMAT 2.5/2.5MCG INH 4GM] 4 g 12    Sig: INHALE 2 PUFFS INTO THE LUNGS DAILY     Off-Protocol Failed - 07/16/2023 12:41 PM      Failed - Medication not assigned to a protocol, review manually.      Passed - Valid encounter within last 12 months    Recent Outpatient Visits           3 months ago Rib pain   [place] [facility] [place], [place] T, NP   3 months ago Rib pain   [place] [facility], [place] T, NP   4 months ago Acute non-recurrent sinusitis, unspecified location   [place] [facility] Gabriel Cirri, NP   5 months ago COPD exacerbation [facility])   [place] [facility] [place], Corrie Dandy T, NP   5 months ago COPD exacerbation [facility])   [place] [facility] [place], Dorie Rank, NP       Future Appointments             Today Marjie Skiff, NP [place] [facility], [facility]

## 2023-07-18 NOTE — Telephone Encounter (Addendum)
Medication Refill - Medication: STIOLTO RESPIMAT 2.5-2.5 MCG/ACT AERS [Pharmacy Med Name: STIOLTO RESPIMAT 2.5/2.5MCG INH 4GM]   Pt calling to f/u on the request pharmacy sent for a refill.   Please advise.  Has the patient contacted their pharmacy? Yes.   No, more refills.   (Agent: If yes, when and what did the pharmacy advise?)  Preferred Pharmacy (with phone number or street name):  [id number] [place], [place] - [address] AT [facility] OF [address] [place]  [address] [place] [address]  Phone: [phone] Fax: [phone]  Hours: Not open 24 hours   Has the patient been seen for an appointment in the last year OR does the patient have an upcoming appointment? Yes.    Agent: Please be advised that RX refills may take up to 3 business days. We ask that you follow-up with your pharmacy.

## 2023-07-19 NOTE — Telephone Encounter (Signed)
Requested Prescriptions  Pending Prescriptions Disp Refills   Tiotropium Bromide-Olodaterol (STIOLTO RESPIMAT) 2.5-2.5 MCG/ACT AERS 4 g 12    Sig: Inhale 2 puffs into the lungs daily.     Off-Protocol Failed - 07/18/2023 12:25 PM      Failed - Medication not assigned to a protocol, review manually.      Passed - Valid encounter within last 12 months    Recent Outpatient Visits           3 months ago Rib pain   [place] [facility] [place], [place] T, NP   3 months ago Rib pain   [place] [facility] [place], [place] T, NP   4 months ago Acute non-recurrent sinusitis, unspecified location   [place] [facility] Gabriel Cirri, NP   5 months ago COPD exacerbation [facility])   [place] [facility] [place], Corrie Dandy T, NP   5 months ago COPD exacerbation [facility])   [place] [facility] [place], Dorie Rank, NP       Future Appointments             In 2 weeks [place], Dorie Rank, NP [place] [facility], [facility]

## 2023-07-30 NOTE — Patient Instructions (Signed)
 Be Involved in Caring For Your Health:  Taking Medications When medications are taken as directed, they can greatly improve your health. But if they are not taken as prescribed, they may not work. In some cases, not taking them correctly can be harmful. To help ensure your treatment remains effective and safe, understand your medications and how to take them. Bring your medications to each visit for review by your provider.  Your lab results, notes, and after visit summary will be available on My Chart. We strongly encourage you to use this feature. If lab results are abnormal the clinic will contact you with the appropriate steps. If the clinic does not contact you assume the results are satisfactory. You can always view your results on My Chart. If you have questions regarding your health or results, please contact the clinic during office hours. You can also ask questions on My Chart.  We at [facility] are grateful that you chose [place] to provide your care. We strive to provide evidence-based and compassionate care and are always looking for feedback. If you get a survey from the clinic please complete this so we can hear your opinions.  Eating Plan for Chronic Obstructive Pulmonary Disease Chronic obstructive pulmonary disease (COPD) causes symptoms such as shortness of breath, coughing, and chest discomfort. These symptoms can make it difficult to eat enough to maintain a healthy weight. Generally, people with COPD should eat a diet that is high in calories, protein, and other nutrients to maintain body weight and to keep the lungs as healthy as possible. Depending on the medicines you take and other health conditions you may have, your health care provider may give you additional recommendations on what to eat or avoid. Talk with your health care provider about your goals for body weight, and work with a dietitian to develop an eating plan that is right for you. What are tips for  following this plan? Reading food labels  Avoid foods with more than 300 milligrams (mg) of salt (sodium) per serving. Choose foods that contain at least 4 grams (g) of fiber per serving. Try to eat 20-30 g of fiber each day. Choose foods that are high in calories and protein, such as nuts, beans, yogurt, and cheese. Shopping Do not buy foods labeled as diet, low-calorie, or low-fat. If you are able to eat dairy products: Avoid low-fat or skim milk. Buy dairy products that have at least 2% fat. Buy nutritional supplement drinks. Buy grains and prepared foods labeled as enriched or fortified. Consider buying low-sodium, pre-made foods to conserve energy for eating. Cooking Add dry milk or protein powder to smoothies. Cook with healthy fats, such as olive oil, canola oil, sunflower oil, and grapeseed oil. Add oil, butter, cream cheese, or nut butters to foods to increase fat and calories. To make foods easier to chew and swallow: Cook vegetables, pasta, and rice until soft. Cut or grind meat into very small pieces. Dip breads in liquid. Meal planning  Eat when you feel hungry. Eat 5-6 small meals throughout the day. Drink 6-8 glasses of water each day. Do not drink liquids with meals. Drink liquids at the end of the meal to avoid feeling full too quickly. Eat a variety of fruits and vegetables every day. Ask for assistance from family or friends with planning and preparing meals as needed. Avoid foods that cause you to feel bloated, such as carbonated drinks, fried foods, beans, broccoli, cabbage, and apples. For older adults, ask your local  agency on aging whether you are eligible for meal assistance programs, such as Meals on Wheels. Lifestyle  Do not smoke. Eat slowly. Take small bites and chew food well before swallowing. Do not overeat. This may make it more difficult to breathe after eating. Sit up while eating. If needed, continue to use supplemental oxygen while  eating. Rest or relax for 30 minutes before and after eating. Monitor your weight as told by your health care provider. Exercise as told by your health care provider. What foods should I eat? Fruits All fresh, dried, canned, or frozen fruits that do not cause gas. Vegetables All fresh, canned (no salt added), or frozen vegetables that do not cause gas. Grains Whole-grain bread. Enriched whole-grain pasta. Fortified whole-grain cereals. Fortified rice. Quinoa. Meats and other proteins Lean meat. Poultry. Fish. Dried beans. Unsalted nuts. Tofu. Eggs. Nut butters. Dairy Whole or 2% milk. Cheese. Yogurt. Fats and oils Olive oil. Canola oil. Butter. Margarine. Beverages Water. Vegetable juice (no salt added). Decaffeinated coffee. Decaffeinated or herbal tea. Seasonings and condiments Fresh or dried herbs. Low-salt or salt-free seasonings. Low-sodium soy sauce. The items listed above may not be a complete list of foods and beverages you can eat. Contact a dietitian for more information. What foods should I avoid? Fruits Fruits that cause gas, such as apples or melon. Vegetables Vegetables that cause gas, such as broccoli, Brussels sprouts, cabbage, cauliflower, and onions. Canned vegetables with added salt. Meats and other proteins Fried meat. Salt-cured meat. Processed meat. Dairy Fat-free or low-fat milk, yogurt, or cheese. Processed cheese. Beverages Carbonated drinks. Caffeinated drinks, such as coffee, tea, and soft drinks. Juice. Alcohol. Vegetable juice with added salt. Seasonings and condiments Salt. Seasoning mixes with salt. Soy sauce. Rosita Fire. Other foods Clear soup or broth. Fried foods. Prepared frozen meals. The items listed above may not be a complete list of foods and beverages you should avoid. Contact a dietitian for more information. Summary COPD symptoms can make it difficult to eat enough to maintain a healthy weight. A COPD eating plan can help you maintain  your body weight and keep your lungs as healthy as possible. Eat a diet that is high in calories, protein, and other nutrients. Read labels to make sure that you are getting the right nutrients. Cook foods to make them easier to chew and swallow. Eat 5-6 small meals throughout the day, and avoid foods that cause gas or make you feel bloated. This information is not intended to replace advice given to you by your health care provider. Make sure you discuss any questions you have with your health care provider. Document Revised: 09/29/2020 Document Reviewed: 09/29/2020 [employer] Patient Education  2024 [employer].

## 2023-08-04 ENCOUNTER — Encounter: Payer: Self-pay | Admitting: Nurse Practitioner

## 2023-08-04 ENCOUNTER — Ambulatory Visit (INDEPENDENT_AMBULATORY_CARE_PROVIDER_SITE_OTHER): Payer: Medicare PPO | Admitting: Nurse Practitioner

## 2023-08-04 VITALS — BP 138/68 | HR 67 | Temp 98.5°F | Resp 18 | Ht 59.1 in | Wt 130.4 lb

## 2023-08-04 DIAGNOSIS — Z23 Encounter for immunization: Secondary | ICD-10-CM

## 2023-08-04 DIAGNOSIS — F339 Major depressive disorder, recurrent, unspecified: Secondary | ICD-10-CM | POA: Diagnosis not present

## 2023-08-04 DIAGNOSIS — E559 Vitamin D deficiency, unspecified: Secondary | ICD-10-CM | POA: Diagnosis not present

## 2023-08-04 DIAGNOSIS — E7801 Familial hypercholesterolemia: Secondary | ICD-10-CM

## 2023-08-04 DIAGNOSIS — Z0000 Encounter for general adult medical examination without abnormal findings: Secondary | ICD-10-CM | POA: Diagnosis not present

## 2023-08-04 DIAGNOSIS — I10 Essential (primary) hypertension: Secondary | ICD-10-CM | POA: Diagnosis not present

## 2023-08-04 DIAGNOSIS — M85852 Other specified disorders of bone density and structure, left thigh: Secondary | ICD-10-CM | POA: Diagnosis not present

## 2023-08-04 DIAGNOSIS — J410 Simple chronic bronchitis: Secondary | ICD-10-CM | POA: Diagnosis not present

## 2023-08-04 DIAGNOSIS — R7989 Other specified abnormal findings of blood chemistry: Secondary | ICD-10-CM

## 2023-08-04 DIAGNOSIS — F419 Anxiety disorder, unspecified: Secondary | ICD-10-CM | POA: Diagnosis not present

## 2023-08-04 DIAGNOSIS — Z87891 Personal history of nicotine dependence: Secondary | ICD-10-CM

## 2023-08-04 MED ORDER — MONTELUKAST SODIUM 10 MG PO TABS: ORAL_TABLET | ORAL | 4 refills | Status: DC

## 2023-08-04 MED ORDER — STIOLTO RESPIMAT 2.5-2.5 MCG/ACT IN AERS: 2.0000 | INHALATION_SPRAY | Freq: Every day | RESPIRATORY_TRACT | 12 refills | Status: DC

## 2023-08-04 MED ORDER — BUSPIRONE HCL 5 MG PO TABS: 5.0000 mg | ORAL_TABLET | Freq: Three times a day (TID) | ORAL | 4 refills | Status: DC | PRN

## 2023-08-04 MED ORDER — ROSUVASTATIN CALCIUM 20 MG PO TABS: 20.0000 mg | ORAL_TABLET | Freq: Every day | ORAL | 4 refills | Status: DC

## 2023-08-04 NOTE — Assessment & Plan Note (Signed)
Refer to depression plan of care. 

## 2023-08-04 NOTE — Progress Notes (Signed)
BP 138/68 (BP Location: Left Arm, Patient Position: Sitting, Cuff Size: Normal)   Pulse 67   Temp 98.5 F (36.9 C) (Oral)   Resp 18   Ht 4' 11.1" (1.501 m)   Wt 130 lb 6.4 oz (59.1 kg)   SpO2 96%   BMI 26.25 kg/m    Subjective:    Patient ID: Shelly Bridges, female    DOB: [birth year], 77 y.o.   MRN: [id number]  HPI: Shelly Bridges is a 77 y.o. female presenting on 08/04/2023 for comprehensive medical examination. Current medical complaints include:none  She currently lives with: son Menopausal Symptoms: no  HYPERTENSION / HYPERLIPIDEMIA Continues on Crestor.  No current medications for HTN.  EF June 2024 60-65%.  History of elevation in TSH, but recent two levels have been stable. Satisfied with current treatment? yes Duration of hypertension: years BP monitoring frequency: rarely BP range:  BP medication side effects: no medication Duration of hyperlipidemia: chronic Cholesterol medication side effects: no Cholesterol supplements: none Medication compliance: excellent compliance Aspirin: no Recent stressors: no Recurrent headaches: no Visual changes: no Palpitations: no Dyspnea: yes Chest pain: no Lower extremity edema: no Dizzy/lightheaded: no   OSTEOPENIA DEXA screening, recent noted ongoing osteopenia, but T-score went from -1.2 to -2.3 this check on 12/20/21. Satisfied with current treatment?: yes Adequate calcium & vitamin D: yes Weight bearing exercises: no   COPD Continues on Stiolto and Albuterol.  Singulair and Claritin for allergy triggers.  COPD status: stable Satisfied with current treatment?: yes Oxygen use: no Dyspnea frequency: no Cough frequency: no Rescue inhaler frequency: 2-3 times a day Limitation of activity: yes Productive cough: none Last Spirometry: FEV1 52% and FEV1/FVC 113% -- 06/20/22 Pneumovax: Up to Date Influenza: Up to Date   DEPRESSION Continues on Sertraline and Buspar daily.  Is having some increased  situational anxiety due to family stressors. Mood status: stable Satisfied with current treatment?: yes Symptom severity: mild  Duration of current treatment : years Side effects: no Medication compliance: excellent compliance Psychotherapy/counseling: no  Depressed mood: no Anxious mood: no Anhedonia: no Significant weight loss or gain: no Insomnia: no  Fatigue: no Feelings of worthlessness or guilt: no Impaired concentration/indecisiveness: no Suicidal ideations: no Hopelessness: no Crying spells: no    08/04/2023    2:02 PM 04/05/2023   11:19 AM 03/29/2023   10:23 AM 02/21/2023   10:11 AM 02/06/2023    2:47 PM  Depression screen PHQ 2/9  Decreased Interest 0 0 0 1 1  Down, Depressed, Hopeless 1 0 0 1 1  PHQ - 2 Score 1 0 0 2 2  Altered sleeping 0 0 0 1 1  Tired, decreased energy 1 0 0 1 1  Change in appetite 0 0 0 1 1  Feeling bad or failure about yourself  0 0 0 1 1  Trouble concentrating 0 0 0 1 1  Moving slowly or fidgety/restless 0 0 0 1 0  Suicidal thoughts 0 0 0 0 0  PHQ-9 Score 2 0 0 8 7  Difficult doing work/chores Not difficult at all Not difficult at all Not difficult at all Not difficult at all Not difficult at all       08/04/2023    2:03 PM 04/05/2023   11:19 AM 03/29/2023   10:23 AM 02/21/2023   10:11 AM  GAD 7 : Generalized Anxiety Score  Nervous, Anxious, on Edge 3 0 1 1  Control/stop worrying 3 3 1 1   Worry too much - different  things 3 3 2 1   Trouble relaxing 1 0 1 1  Restless 3 0 1 1  Easily annoyed or irritable 1 0 0 1  Afraid - awful might happen 3 0 1 1  Total GAD 7 Score 17 6 7 7   Anxiety Difficulty Not difficult at all Not difficult at all  Not difficult at all   Functional Status Survey: Is the patient deaf or have difficulty hearing?: No Does the patient have difficulty seeing, even when wearing glasses/contacts?: No Does the patient have difficulty concentrating, remembering, or making decisions?: No Does the patient have difficulty  walking or climbing stairs?: No Does the patient have difficulty dressing or bathing?: No Does the patient have difficulty doing errands alone such as visiting a doctor's office or shopping?: No      02/06/2023   10:09 AM 02/06/2023    2:46 PM 03/29/2023   10:23 AM 04/05/2023   11:19 AM 08/04/2023    2:02 PM  Fall Risk  Falls in the past year? 1 1 1 1 1   Was there an injury with Fall? 0 0  1 0  Fall Risk Category Calculator 2 2  3 1   Patient at Risk for Falls Due to History of fall(s) History of fall(s) History of fall(s) History of fall(s) No Fall Risks  Fall risk Follow up Falls evaluation completed;Falls prevention discussed Falls evaluation completed Falls evaluation completed Falls evaluation completed Falls evaluation completed    Past Medical History:  Past Medical History:  Diagnosis Date   Allergy Always   Hayfever   Anxiety    Asthma    Cataract 2021   Beginning of some   COPD (chronic obstructive pulmonary disease) (HCC)    Mild   Lipoma    Neutropenia (HCC)    Osteoporosis    Vitamin D deficiency disease     Surgical History:  Past Surgical History:  Procedure Laterality Date   CATARACT EXTRACTION Bilateral 03/2022   EYE SURGERY  2023    Medications:  Current Outpatient Medications on File Prior to Visit  Medication Sig   albuterol (PROVENTIL) (2.5 MG/3ML) 0.083% nebulizer solution Take 3 mLs (2.5 mg total) by nebulization every 6 (six) hours as needed for wheezing or shortness of breath.   albuterol (VENTOLIN HFA) 108 (90 Base) MCG/ACT inhaler INHALE 2 PUFFS INTO THE LUNGS EVERY 4 HOURS AS NEEDED FOR WHEEZING OR SHORTNESS OF BREATH   Nebulizers (EASY AIR COMPRESSOR NEBULIZER) MISC Use to inhaler nebulizer treatments at needed per instructions on nebulizer prescription   sertraline (ZOLOFT) 100 MG tablet Take 1 tablet (100 mg total) by mouth daily.   Vitamin D, Ergocalciferol, 50 MCG (2000 UT) CAPS Take 2,000 Units by mouth. 1 x day   No current  facility-administered medications on file prior to visit.    Allergies:  No Known Allergies  Social History:  Social History   Socioeconomic History   Marital status: Divorced    Spouse name: Not on file   Number of children: 3   Years of education: Not on file   Highest education level: Some college, no degree  Occupational History   Not on file  Tobacco Use   Smoking status: Former    Current packs/day: 0.00    Average packs/day: 1 pack/day for 20.0 years (20.0 ttl pk-yrs)    Types: Cigarettes    Start date: 06/17/1965    Quit date: 06/17/1985    Years since quitting: 38.1   Smokeless tobacco: Never  Vaping  Use   Vaping status: Never Used  Substance and Sexual Activity   Alcohol use: No   Drug use: No   Sexual activity: Not Currently  Other Topics Concern   Not on file  Social History Narrative   Not on file   Social Determinants of Health   Financial Resource Strain: Low Risk  (02/06/2023)   Overall Financial Resource Strain (CARDIA)    Difficulty of Paying Living Expenses: Not hard at all  Food Insecurity: No Food Insecurity (03/28/2023)   Hunger Vital Sign    Worried About Running Out of Food in the Last Year: Never true    Ran Out of Food in the Last Year: Never true  Transportation Needs: No Transportation Needs (03/28/2023)   PRAPARE - Administrator, Civil Service (Medical): No    Lack of Transportation (Non-Medical): No  Physical Activity: Unknown (03/28/2023)   Exercise Vital Sign    Days of Exercise per Week: Patient declined    Minutes of Exercise per Session: 30 min  Recent Concern: Physical Activity - Insufficiently Active (02/06/2023)   Exercise Vital Sign    Days of Exercise per Week: 3 days    Minutes of Exercise per Session: 30 min  Stress: No Stress Concern Present (03/28/2023)   [employer] of Occupational Health - Occupational Stress Questionnaire    Feeling of Stress : Only a little  Social Connections: Unknown (03/28/2023)    Social Connection and Isolation Panel [NHANES]    Frequency of Communication with Friends and Family: More than three times a week    Frequency of Social Gatherings with Friends and Family: More than three times a week    Attends Religious Services: Patient declined    Database administrator or Organizations: No    Attends Banker Meetings: Never    Marital Status: Divorced  Recent Concern: Social Connections - Socially Isolated (02/06/2023)   Social Connection and Isolation Panel [NHANES]    Frequency of Communication with Friends and Family: More than three times a week    Frequency of Social Gatherings with Friends and Family: Three times a week    Attends Religious Services: Never    Active Member of Clubs or Organizations: No    Attends Banker Meetings: Never    Marital Status: Widowed  Intimate Partner Violence: Not At Risk (02/06/2023)   Humiliation, Afraid, Rape, and Kick questionnaire    Fear of Current or Ex-Partner: No    Emotionally Abused: No    Physically Abused: No    Sexually Abused: No   Social History   Tobacco Use  Smoking Status Former   Current packs/day: 0.00   Average packs/day: 1 pack/day for 20.0 years (20.0 ttl pk-yrs)   Types: Cigarettes   Start date: 06/17/1965   Quit date: 06/17/1985   Years since quitting: 38.1  Smokeless Tobacco Never   Social History   Substance and Sexual Activity  Alcohol Use No    Family History:  Family History  Problem Relation Age of Onset   Dementia Mother    Hypertension Mother    Osteoporosis Mother    Stroke Mother        mini stroke   Varicose Veins Mother    Cancer Father        brain tumor and lung   Hypertension Father    Emphysema Father    COPD Neg Hx    Heart disease Neg Hx     Past  medical history, surgical history, medications, allergies, family history and social history reviewed with patient today and changes made to appropriate areas of the chart.   ROS All other ROS  negative except what is listed above and in the HPI.      Objective:    BP 138/68 (BP Location: Left Arm, Patient Position: Sitting, Cuff Size: Normal)   Pulse 67   Temp 98.5 F (36.9 C) (Oral)   Resp 18   Ht 4' 11.1" (1.501 m)   Wt 130 lb 6.4 oz (59.1 kg)   SpO2 96%   BMI 26.25 kg/m   Wt Readings from Last 3 Encounters:  08/04/23 130 lb 6.4 oz (59.1 kg)  04/05/23 127 lb 8 oz (57.8 kg)  03/29/23 127 lb 8 oz (57.8 kg)    Physical Exam Vitals and nursing note reviewed. Exam conducted with a chaperone present.  Constitutional:      General: She is awake. She is not in acute distress.    Appearance: She is well-developed and well-groomed. She is not ill-appearing or toxic-appearing.  HENT:     Head: Normocephalic and atraumatic.     Right Ear: Hearing, tympanic membrane, ear canal and external ear normal. No drainage.     Left Ear: Hearing, tympanic membrane, ear canal and external ear normal. No drainage.     Nose: Nose normal.     Right Sinus: No maxillary sinus tenderness or frontal sinus tenderness.     Left Sinus: No maxillary sinus tenderness or frontal sinus tenderness.     Mouth/Throat:     Mouth: Mucous membranes are moist.     Pharynx: Oropharynx is clear. Uvula midline. No pharyngeal swelling, oropharyngeal exudate or posterior oropharyngeal erythema.  Eyes:     General: Lids are normal.        Right eye: No discharge.        Left eye: No discharge.     Extraocular Movements: Extraocular movements intact.     Conjunctiva/sclera: Conjunctivae normal.     Pupils: Pupils are equal, round, and reactive to light.     Visual Fields: Right eye visual fields normal and left eye visual fields normal.  Neck:     Thyroid: No thyromegaly.     Vascular: No carotid bruit.     Trachea: Trachea normal.  Cardiovascular:     Rate and Rhythm: Normal rate and regular rhythm.     Heart sounds: Normal heart sounds. No murmur heard.    No gallop.  Pulmonary:     Effort: Pulmonary  effort is normal. No accessory muscle usage or respiratory distress.     Breath sounds: Normal breath sounds.  Chest:  Breasts:    Right: Normal.     Left: Normal.  Abdominal:     General: Bowel sounds are normal.     Palpations: Abdomen is soft. There is no hepatomegaly or splenomegaly.     Tenderness: There is no abdominal tenderness.  Musculoskeletal:        General: Normal range of motion.     Cervical back: Normal range of motion and neck supple.     Right lower leg: No edema.     Left lower leg: No edema.  Lymphadenopathy:     Head:     Right side of head: No submental, submandibular, tonsillar, preauricular or posterior auricular adenopathy.     Left side of head: No submental, submandibular, tonsillar, preauricular or posterior auricular adenopathy.     Cervical: No cervical adenopathy.  Upper Body:     Right upper body: No supraclavicular, axillary or pectoral adenopathy.     Left upper body: No supraclavicular, axillary or pectoral adenopathy.  Skin:    General: Skin is warm and dry.     Capillary Refill: Capillary refill takes less than 2 seconds.     Findings: No rash.  Neurological:     Mental Status: She is alert and oriented to person, place, and time.     Gait: Gait is intact.     Deep Tendon Reflexes: Reflexes are normal and symmetric.     Reflex Scores:      Brachioradialis reflexes are 2+ on the right side and 2+ on the left side.      Patellar reflexes are 2+ on the right side and 2+ on the left side. Psychiatric:        Attention and Perception: Attention normal.        Mood and Affect: Mood normal.        Speech: Speech normal.        Behavior: Behavior normal. Behavior is cooperative.        Thought Content: Thought content normal.        Judgment: Judgment normal.    Results for orders placed or performed during the hospital encounter of 05/18/23  ECHOCARDIOGRAM COMPLETE  Result Value Ref Range   Ao pk vel 1.64 m/s   AV Area VTI 2.94 cm2   AR  max vel 2.84 cm2   AV Mean grad 5.0 mmHg   AV Peak grad 10.8 mmHg   S' Lateral 1.80 cm   AV Area mean vel 2.88 cm2   Area-P 1/2 2.64 cm2   MV VTI 2.78 cm2   Est EF 60 - 65%       Assessment & Plan:   Problem List Items Addressed This Visit       Cardiovascular and Mediastinum   Essential hypertension, benign    Chronic, stable.  BP at goal for her age on recheck.  Will continue diet focus and if needed in future restart HCTZ which has worked well in past, or add on ARB, avoid ACE due to COPD.  LABS: TSH, CBC, CMP.  Return in 6 months.      Relevant Medications   rosuvastatin (CRESTOR) 20 MG tablet   Other Relevant Orders   Comprehensive metabolic panel   CBC with Differential/Platelet     Respiratory   COPD (chronic obstructive pulmonary disease) (HCC) - Primary    Chronic, ongoing. Spirometry July 2023 = FEV1 52% FEV1/FVC 113%. Continue Stiolto and Albuterol as needed.  Continue Singulair and Claritin for allergies as she is tolerating well.  Return in 6 months.  Spirometry next visit.      Relevant Medications   montelukast (SINGULAIR) 10 MG tablet   Tiotropium Bromide-Olodaterol (STIOLTO RESPIMAT) 2.5-2.5 MCG/ACT AERS   Other Relevant Orders   CBC with Differential/Platelet     Musculoskeletal and Integument   Osteopenia of neck of left femur    Ongoing noted on recent DEXA, some decline from previous with T-score -2.3 and previous in 2015 was -1.2.  Educated her on these findings and current treatment.  She is to take Vitamin D3 2000 units daily and intake adequate calcium daily + perform weight bearing exercises. Check Vit D level and plan on repeat DEXA in 5 years, sooner if fracture presents.        Other   Anxiety    Refer to depression  plan of care.      Relevant Medications   busPIRone (BUSPAR) 5 MG tablet   Depression, recurrent (HCC)    Chronic, stable.  Will continue Sertraline and Buspar at this time.  Buspar safer alternative to take for increased  anxiety.  Denies SI/HI.  Return to office in 6 months.      Relevant Medications   busPIRone (BUSPAR) 5 MG tablet   Elevated TSH    No current symptoms.  Recheck TSH and Free T4.      Relevant Orders   TSH   T4, free   Hyperlipidemia    Chronic, ongoing.  Continue current medication regimen and adjust as needed.  Lipid panel today.      Relevant Medications   rosuvastatin (CRESTOR) 20 MG tablet   Other Relevant Orders   Lipid Panel w/o Chol/HDL Ratio   Comprehensive metabolic panel   Vitamin D deficiency disease    Chronic, ongoing.  Check level today and continue supplement.        Relevant Orders   VITAMIN D 25 Hydroxy (Vit-D Deficiency, Fractures)   Other Visit Diagnoses     Need for influenza vaccination       Flu shot today, educated her on this.   Relevant Orders   Flu Vaccine Trivalent High Dose (Fluad) (Completed)   Encounter for annual physical exam       Annual physical today with labs and health maintenance.        Follow up plan: Return in about 6 months (around 02/02/2024) for HTN/HLD, COPD, MOOD.   LABORATORY TESTING:  - Pap smear: Not applicable  IMMUNIZATIONS:   - Tdap: Tetanus vaccination status reviewed: last tetanus booster within 10 years. - Influenza: Provided today - Pneumovax: Up to date - Prevnar: Up to date - COVID: Up to date - HPV: Not applicable - Shingrix vaccine: Up to date  SCREENING: -Mammogram: Up to date due next December 20, 2026 - Colonoscopy: Up to date -- no longer applicable, age 38 - Bone Density: Up to date  -Hearing Test: Not applicable  -Spirometry: Up To Date  PATIENT COUNSELING:   Advised to take 1 mg of folate supplement per day if capable of pregnancy.   Sexuality: Discussed sexually transmitted diseases, partner selection, use of condoms, avoidance of unintended pregnancy  and contraceptive alternatives.   Advised to avoid cigarette smoking.  I discussed with the patient that most people either abstain  from alcohol or drink within safe limits (<=14/week and <=4 drinks/occasion for males, <=7/weeks and <= 3 drinks/occasion for females) and that the risk for alcohol disorders and other health effects rises proportionally with the number of drinks per week and how often a drinker exceeds daily limits.  Discussed cessation/primary prevention of drug use and availability of treatment for abuse.   Diet: Encouraged to adjust caloric intake to maintain  or achieve ideal body weight, to reduce intake of dietary saturated fat and total fat, to limit sodium intake by avoiding high sodium foods and not adding table salt, and to maintain adequate dietary potassium and calcium preferably from fresh fruits, vegetables, and low-fat dairy products.    Stressed the importance of regular exercise  Injury prevention: Discussed safety belts, safety helmets, smoke detector, smoking near bedding or upholstery.   Dental health: Discussed importance of regular tooth brushing, flossing, and dental visits.    NEXT PREVENTATIVE PHYSICAL DUE IN 1 YEAR. Return in about 6 months (around 02/02/2024) for HTN/HLD, COPD, MOOD.

## 2023-08-04 NOTE — Assessment & Plan Note (Signed)
Chronic, stable.  BP at goal for her age on recheck.  Will continue diet focus and if needed in future restart HCTZ which has worked well in past, or add on ARB, avoid ACE due to COPD.  LABS: TSH, CBC, CMP.  Return in 6 months.

## 2023-08-04 NOTE — Assessment & Plan Note (Signed)
Chronic, ongoing. Spirometry July 2023 = FEV1 52% FEV1/FVC 113%. Continue Stiolto and Albuterol as needed.  Continue Singulair and Claritin for allergies as she is tolerating well.  Return in 6 months.  Spirometry next visit.

## 2023-08-04 NOTE — Assessment & Plan Note (Signed)
No current symptoms. Recheck TSH and Free T4.  

## 2023-08-04 NOTE — Assessment & Plan Note (Signed)
Ongoing noted on recent DEXA, some decline from previous with T-score -2.3 and previous in 2015 was -1.2.  Educated her on these findings and current treatment.  She is to take Vitamin D3 2000 units daily and intake adequate calcium daily + perform weight bearing exercises. Check Vit D level and plan on repeat DEXA in 5 years, sooner if fracture presents.

## 2023-08-04 NOTE — Assessment & Plan Note (Signed)
Chronic, stable.  Will continue Sertraline and Buspar at this time.  Buspar safer alternative to take for increased anxiety.  Denies SI/HI.  Return to office in 6 months.

## 2023-08-04 NOTE — Assessment & Plan Note (Signed)
Chronic, ongoing.  Check level today and continue supplement.   

## 2023-08-04 NOTE — Assessment & Plan Note (Signed)
Chronic, ongoing.  Continue current medication regimen and adjust as needed. Lipid panel today. 

## 2023-08-05 LAB — COMPREHENSIVE METABOLIC PANEL
ALT: 14 IU/L (ref 0–32)
AST: 22 IU/L (ref 0–40)
Albumin: 4.3 g/dL (ref 3.8–4.8)
Alkaline Phosphatase: 97 IU/L (ref 44–121)
BUN/Creatinine Ratio: 12 (ref 12–28)
BUN: 10 mg/dL (ref 8–27)
Bilirubin Total: 0.5 mg/dL (ref 0.0–1.2)
CO2: 25 mmol/L (ref 20–29)
Calcium: 9.3 mg/dL (ref 8.7–10.3)
Chloride: 100 mmol/L (ref 96–106)
Creatinine, Ser: 0.85 mg/dL (ref 0.57–1.00)
Globulin, Total: 2.5 g/dL (ref 1.5–4.5)
Glucose: 85 mg/dL (ref 70–99)
Potassium: 4.1 mmol/L (ref 3.5–5.2)
Sodium: 139 mmol/L (ref 134–144)
Total Protein: 6.8 g/dL (ref 6.0–8.5)
eGFR: 71 mL/min/{1.73_m2} (ref >59)

## 2023-08-05 LAB — CBC WITH DIFFERENTIAL/PLATELET
Basophils Absolute: 0 10*3/uL (ref 0.0–0.2)
Basos: 1 %
EOS (ABSOLUTE): 0.1 10*3/uL (ref 0.0–0.4)
Eos: 2 %
Hematocrit: 37.5 % (ref 34.0–46.6)
Hemoglobin: 12 g/dL (ref 11.1–15.9)
Immature Grans (Abs): 0 10*3/uL (ref 0.0–0.1)
Immature Granulocytes: 0 %
Lymphocytes Absolute: 1.2 10*3/uL (ref 0.7–3.1)
Lymphs: 24 %
MCH: 30.1 pg (ref 26.6–33.0)
MCHC: 32 g/dL (ref 31.5–35.7)
MCV: 94 fL (ref 79–97)
Monocytes Absolute: 0.3 10*3/uL (ref 0.1–0.9)
Monocytes: 6 %
Neutrophils Absolute: 3.3 10*3/uL (ref 1.4–7.0)
Neutrophils: 67 %
Platelets: 147 10*3/uL — ABNORMAL LOW (ref 150–450)
RBC: 3.99 x10E6/uL (ref 3.77–5.28)
RDW: 11.4 % — ABNORMAL LOW (ref 11.7–15.4)
WBC: 5 10*3/uL (ref 3.4–10.8)

## 2023-08-05 LAB — LIPID PANEL W/O CHOL/HDL RATIO
Cholesterol, Total: 132 mg/dL (ref 100–199)
HDL: 67 mg/dL (ref >39)
LDL Chol Calc (NIH): 53 mg/dL (ref 0–99)
Triglycerides: 52 mg/dL (ref 0–149)
VLDL Cholesterol Cal: 12 mg/dL (ref 5–40)

## 2023-08-05 LAB — TSH: TSH: 3.09 u[IU]/mL (ref 0.450–4.500)

## 2023-08-05 LAB — VITAMIN D 25 HYDROXY (VIT D DEFICIENCY, FRACTURES): Vit D, 25-Hydroxy: 43.3 ng/mL (ref 30.0–100.0)

## 2023-08-05 LAB — T4, FREE: Free T4: 1.1 ng/dL (ref 0.82–1.77)

## 2023-08-06 NOTE — Progress Notes (Signed)
Contacted via MyChart   Good morning Shelly Bridges, your labs have returned and overall are stable with exception of mildly low platelet count which could be multiple things, including lab error or frequent use of Aspirin.  We will recheck next visit.  Any questions?  No medication changes needed. Keep being amazing!!  Thank you for allowing me to participate in your care.  I appreciate you. Kindest regards, Kelin Nixon

## 2023-11-20 DIAGNOSIS — S42301A Unspecified fracture of shaft of humerus, right arm, initial encounter for closed fracture: Secondary | ICD-10-CM | POA: Diagnosis not present

## 2023-11-20 DIAGNOSIS — S42201A Unspecified fracture of upper end of right humerus, initial encounter for closed fracture: Secondary | ICD-10-CM | POA: Diagnosis not present

## 2023-11-20 DIAGNOSIS — S43202A Unspecified subluxation of left sternoclavicular joint, initial encounter: Secondary | ICD-10-CM | POA: Diagnosis not present

## 2023-11-22 DIAGNOSIS — S42302A Unspecified fracture of shaft of humerus, left arm, initial encounter for closed fracture: Secondary | ICD-10-CM | POA: Diagnosis not present

## 2023-12-01 DIAGNOSIS — S42302A Unspecified fracture of shaft of humerus, left arm, initial encounter for closed fracture: Secondary | ICD-10-CM | POA: Diagnosis not present

## 2023-12-08 ENCOUNTER — Ambulatory Visit: Payer: Self-pay | Admitting: *Deleted

## 2023-12-08 DIAGNOSIS — S42302A Unspecified fracture of shaft of humerus, left arm, initial encounter for closed fracture: Secondary | ICD-10-CM | POA: Diagnosis not present

## 2023-12-08 NOTE — Telephone Encounter (Signed)
 Message from [name] sent at 12/08/2023  2:16 PM EST  Summary: experiencing swelling in her legs and feet   Pt stated she broke the top part of her left arm 12/16. Since fall, she has been experiencing swelling in her legs and feet, stating it all feels extremely tight and just started last week.  Pt is requesting fluid pills.  Seeking clinical advice.          Call History  Contact Date/Time Type Contact Phone/Fax By  12/08/2023 02:14 PM EST Phone (Incoming) Shelly Bridges, Shelly Bridges (Self) [phone] MIKE) McGill, Alondra   Reason for Disposition  [1] MILD swelling of both ankles (i.e., pedal edema) AND [2] new-onset or worsening  Answer Assessment - Initial Assessment Questions 1. ONSET: When did the swelling start? (e.g., minutes, hours, days)     Swelling in my legs.   I fell and broke my femur 11/20/2023.   I can't do anything as far as moving around.   My left arm had a cast.   My legs have gotten swollen.   My feet ankles and legs.   It's tight and swollen.   It's coming from not moving around.     2. LOCATION: What part of the leg is swollen?  Are both legs swollen or just one leg?     Bilateral legs.    3. SEVERITY: How bad is the swelling? (e.g., localized; mild, moderate, severe)   - Localized: Small area of swelling localized to one leg.   - MILD pedal edema: Swelling limited to foot and ankle, pitting edema < 1/4 inch (6 mm) deep, rest and elevation eliminate most or all swelling.   - MODERATE edema: Swelling of lower leg to knee, pitting edema > 1/4 inch (6 mm) deep, rest and elevation only partially reduce swelling.   - SEVERE edema: Swelling extends above knee, facial or hand swelling present.      Moderate. I'm keeping them raised but still the swelling is getting worse.   4. REDNESS: Does the swelling look red or infected?     No redness or infection 5. PAIN: Is the swelling painful to touch? If Yes, ask: How painful is it?   (Scale 1-10; mild,  moderate or severe)     No pain 6. FEVER: Do you have a fever? If Yes, ask: What is it, how was it measured, and when did it start?      No 7. CAUSE: What do you think is causing the leg swelling?     Inactivity     They are tight feeling. 8. MEDICAL HISTORY: Do you have a history of blood clots (e.g., DVT), cancer, heart failure, kidney disease, or liver failure?     No 9. RECURRENT SYMPTOM: Have you had leg swelling before? If Yes, ask: When was the last time? What happened that time?     No 10. OTHER SYMPTOMS: Do you have any other symptoms? (e.g., chest pain, difficulty breathing)       No 11. PREGNANCY: Is there any chance you are pregnant? When was your last menstrual period?       N/A due to age  Protocols used: Leg Swelling and Edema-A-AH

## 2023-12-08 NOTE — Telephone Encounter (Signed)
  Chief Complaint: Bilateral leg swelling from inactivity due to a broken femur on 11/20/2023. Symptoms: Bilateral lower legs are swollen and tight feeling.  She is keeping them elevated but her activity is limited right now.   Was requesting a fluid pill be called in.   I let her know she would need to be seen first.    Frequency: Since breaking her femur on 11/20/2023 and being inactive.    Pertinent Negatives: Patient denies ever having this before.   Denies heart issues.   Disposition: [] ED /[] Urgent Care (no appt availability in office) / [x] Appointment(In office/virtual)/ []  [facility]/ [] Home Care/ [] Refused Recommended Disposition /[] [facility]/ []  Follow-up with PCP Additional Notes: Appt made with Jolene Cannady, NP 12/27/2023 at 11:20.    Instructed to go to the ED if she started having shortness of breath or chest discomfort of any kind.    She was agreeable to this plan.

## 2023-12-19 ENCOUNTER — Ambulatory Visit: Payer: Medicare PPO | Admitting: Pediatrics

## 2023-12-22 DIAGNOSIS — S42302A Unspecified fracture of shaft of humerus, left arm, initial encounter for closed fracture: Secondary | ICD-10-CM | POA: Diagnosis not present

## 2023-12-27 ENCOUNTER — Ambulatory Visit: Payer: Medicare PPO | Admitting: Nurse Practitioner

## 2024-01-05 DIAGNOSIS — M25611 Stiffness of right shoulder, not elsewhere classified: Secondary | ICD-10-CM | POA: Diagnosis not present

## 2024-01-05 DIAGNOSIS — S42301D Unspecified fracture of shaft of humerus, right arm, subsequent encounter for fracture with routine healing: Secondary | ICD-10-CM | POA: Diagnosis not present

## 2024-01-09 DIAGNOSIS — S42302D Unspecified fracture of shaft of humerus, left arm, subsequent encounter for fracture with routine healing: Secondary | ICD-10-CM | POA: Diagnosis not present

## 2024-01-11 DIAGNOSIS — M25511 Pain in right shoulder: Secondary | ICD-10-CM | POA: Diagnosis not present

## 2024-01-11 DIAGNOSIS — M25611 Stiffness of right shoulder, not elsewhere classified: Secondary | ICD-10-CM | POA: Diagnosis not present

## 2024-01-17 DIAGNOSIS — M25511 Pain in right shoulder: Secondary | ICD-10-CM | POA: Diagnosis not present

## 2024-01-17 DIAGNOSIS — M25611 Stiffness of right shoulder, not elsewhere classified: Secondary | ICD-10-CM | POA: Diagnosis not present

## 2024-01-19 DIAGNOSIS — M25611 Stiffness of right shoulder, not elsewhere classified: Secondary | ICD-10-CM | POA: Diagnosis not present

## 2024-01-19 DIAGNOSIS — M25511 Pain in right shoulder: Secondary | ICD-10-CM | POA: Diagnosis not present

## 2024-01-23 DIAGNOSIS — M25511 Pain in right shoulder: Secondary | ICD-10-CM | POA: Diagnosis not present

## 2024-01-23 DIAGNOSIS — M25611 Stiffness of right shoulder, not elsewhere classified: Secondary | ICD-10-CM | POA: Diagnosis not present

## 2024-01-28 NOTE — Patient Instructions (Signed)
 Be Involved in Caring For Your Health:  Taking Medications When medications are taken as directed, they can greatly improve your health. But if they are not taken as prescribed, they may not work. In some cases, not taking them correctly can be harmful. To help ensure your treatment remains effective and safe, understand your medications and how to take them. Bring your medications to each visit for review by your provider.  Your lab results, notes, and after visit summary will be available on My Chart. We strongly encourage you to use this feature. If lab results are abnormal the clinic will contact you with the appropriate steps. If the clinic does not contact you assume the results are satisfactory. You can always view your results on My Chart. If you have questions regarding your health or results, please contact the clinic during office hours. You can also ask questions on My Chart.  We at [facility] are grateful that you chose [place] to provide your care. We strive to provide evidence-based and compassionate care and are always looking for feedback. If you get a survey from the clinic please complete this so we can hear your opinions.  Heart-Healthy Eating Plan Many factors influence your heart health, including eating and exercise habits. Heart health is also called coronary health. Coronary risk increases with abnormal blood fat (lipid) levels. A heart-healthy eating plan includes limiting unhealthy fats, increasing healthy fats, limiting salt (sodium) intake, and making other diet and lifestyle changes. What is my plan? Your health care provider may recommend that: You limit your fat intake to _________% or less of your total calories each day. You limit your saturated fat intake to _________% or less of your total calories each day. You limit the amount of cholesterol in your diet to less than _________ mg per day. You limit the amount of sodium in your diet to less than _________  mg per day. What are tips for following this plan? Cooking Cook foods using methods other than frying. Baking, boiling, grilling, and broiling are all good options. Other ways to reduce fat include: Removing the skin from poultry. Removing all visible fats from meats. Steaming vegetables in water or broth. Meal planning  At meals, imagine dividing your plate into fourths: Fill one-half of your plate with vegetables and green salads. Fill one-fourth of your plate with whole grains. Fill one-fourth of your plate with lean protein foods. Eat 2-4 cups of vegetables per day. One cup of vegetables equals 1 cup (91 g) broccoli or cauliflower florets, 2 medium carrots, 1 large bell pepper, 1 large sweet potato, 1 large tomato, 1 medium white potato, 2 cups (150 g) raw leafy greens. Eat 1-2 cups of fruit per day. One cup of fruit equals 1 small apple, 1 large banana, 1 cup (237 g) mixed fruit, 1 large orange,  cup (82 g) dried fruit, 1 cup (240 mL) 100% fruit juice. Eat more foods that contain soluble fiber. Examples include apples, broccoli, carrots, beans, peas, and barley. Aim to get 25-30 g of fiber per day. Increase your consumption of legumes, nuts, and seeds to 4-5 servings per week. One serving of dried beans or legumes equals  cup (90 g) cooked, 1 serving of nuts is  oz (12 almonds, 24 pistachios, or 7 walnut halves), and 1 serving of seeds equals  oz (8 g). Fats Choose healthy fats more often. Choose monounsaturated and polyunsaturated fats, such as olive and canola oils, avocado oil, flaxseeds, walnuts, almonds, and seeds. Eat  more omega-3 fats. Choose salmon, mackerel, sardines, tuna, flaxseed oil, and ground flaxseeds. Aim to eat fish at least 2 times each week. Check food labels carefully to identify foods with trans fats or high amounts of saturated fat. Limit saturated fats. These are found in animal products, such as meats, butter, and cream. Plant sources of saturated fats  include palm oil, palm kernel oil, and coconut oil. Avoid foods with partially hydrogenated oils in them. These contain trans fats. Examples are stick margarine, some tub margarines, cookies, crackers, and other baked goods. Avoid fried foods. General information Eat more home-cooked food and less restaurant, buffet, and fast food. Limit or avoid alcohol. Limit foods that are high in added sugar and simple starches such as foods made using white refined flour (white breads, pastries, sweets). Lose weight if you are overweight. Losing just 5-10% of your body weight can help your overall health and prevent diseases such as diabetes and heart disease. Monitor your sodium intake, especially if you have high blood pressure. Talk with your health care provider about your sodium intake. Try to incorporate more vegetarian meals weekly. What foods should I eat? Fruits All fresh, canned (in natural juice), or frozen fruits. Vegetables Fresh or frozen vegetables (raw, steamed, roasted, or grilled). Green salads. Grains Most grains. Choose whole wheat and whole grains most of the time. Rice and pasta, including brown rice and pastas made with whole wheat. Meats and other proteins Lean, well-trimmed beef, veal, pork, and lamb. Chicken and [place] without skin. All fish and shellfish. Wild duck, rabbit, pheasant, and venison. Egg whites or low-cholesterol egg substitutes. Dried beans, peas, lentils, and tofu. Seeds and most nuts. Dairy Low-fat or nonfat cheeses, including ricotta and mozzarella. Skim or 1% milk (liquid, powdered, or evaporated). Buttermilk made with low-fat milk. Nonfat or low-fat yogurt. Fats and oils Non-hydrogenated (trans-free) margarines. Vegetable oils, including soybean, sesame, sunflower, olive, avocado, peanut, safflower, corn, canola, and cottonseed. Salad dressings or mayonnaise made with a vegetable oil. Beverages Water (mineral or sparkling). Coffee and tea. Unsweetened ice  tea. Diet beverages. Sweets and desserts Sherbet, gelatin, and fruit ice. Small amounts of dark chocolate. Limit all sweets and desserts. Seasonings and condiments All seasonings and condiments. The items listed above may not be a complete list of foods and beverages you can eat. Contact a dietitian for more options. What foods should I avoid? Fruits Canned fruit in heavy syrup. Fruit in cream or butter sauce. Fried fruit. Limit coconut. Vegetables Vegetables cooked in cheese, cream, or butter sauce. Fried vegetables. Grains Breads made with saturated or trans fats, oils, or whole milk. Croissants. Sweet rolls. Donuts. High-fat crackers, such as cheese crackers and chips. Meats and other proteins Fatty meats, such as hot dogs, ribs, sausage, bacon, rib-eye roast or steak. High-fat deli meats, such as salami and bologna. Caviar. Domestic duck and goose. Organ meats, such as liver. Dairy Cream, sour cream, cream cheese, and creamed cottage cheese. Whole-milk cheeses. Whole or 2% milk (liquid, evaporated, or condensed). Whole buttermilk. Cream sauce or high-fat cheese sauce. Whole-milk yogurt. Fats and oils Meat fat, or shortening. Cocoa butter, hydrogenated oils, palm oil, coconut oil, palm kernel oil. Solid fats and shortenings, including bacon fat, salt pork, lard, and butter. Nondairy cream substitutes. Salad dressings with cheese or sour cream. Beverages Regular sodas and any drinks with added sugar. Sweets and desserts Frosting. Pudding. Cookies. Cakes. Pies. Milk chocolate or white chocolate. Buttered syrups. Full-fat ice cream or ice cream drinks. The items listed above may  not be a complete list of foods and beverages to avoid. Contact a dietitian for more information. Summary Heart-healthy meal planning includes limiting unhealthy fats, increasing healthy fats, limiting salt (sodium) intake and making other diet and lifestyle changes. Lose weight if you are overweight. Losing just  5-10% of your body weight can help your overall health and prevent diseases such as diabetes and heart disease. Focus on eating a balance of foods, including fruits and vegetables, low-fat or nonfat dairy, lean protein, nuts and legumes, whole grains, and heart-healthy oils and fats. This information is not intended to replace advice given to you by your health care provider. Make sure you discuss any questions you have with your health care provider. Document Revised: 12/27/2021 Document Reviewed: 12/27/2021 Elsevier Patient Education  2024 [employer].

## 2024-01-30 DIAGNOSIS — S42301D Unspecified fracture of shaft of humerus, right arm, subsequent encounter for fracture with routine healing: Secondary | ICD-10-CM | POA: Diagnosis not present

## 2024-01-30 DIAGNOSIS — M25612 Stiffness of left shoulder, not elsewhere classified: Secondary | ICD-10-CM | POA: Diagnosis not present

## 2024-01-30 DIAGNOSIS — M25512 Pain in left shoulder: Secondary | ICD-10-CM | POA: Diagnosis not present

## 2024-02-01 DIAGNOSIS — M25612 Stiffness of left shoulder, not elsewhere classified: Secondary | ICD-10-CM | POA: Diagnosis not present

## 2024-02-01 DIAGNOSIS — S42301D Unspecified fracture of shaft of humerus, right arm, subsequent encounter for fracture with routine healing: Secondary | ICD-10-CM | POA: Diagnosis not present

## 2024-02-01 DIAGNOSIS — M25512 Pain in left shoulder: Secondary | ICD-10-CM | POA: Diagnosis not present

## 2024-02-02 ENCOUNTER — Encounter: Payer: Self-pay | Admitting: Nurse Practitioner

## 2024-02-02 ENCOUNTER — Ambulatory Visit: Payer: Medicare PPO | Admitting: Nurse Practitioner

## 2024-02-02 VITALS — BP 118/64 | HR 69 | Temp 98.2°F | Ht 59.5 in | Wt 131.0 lb

## 2024-02-02 DIAGNOSIS — F339 Major depressive disorder, recurrent, unspecified: Secondary | ICD-10-CM | POA: Diagnosis not present

## 2024-02-02 DIAGNOSIS — J410 Simple chronic bronchitis: Secondary | ICD-10-CM | POA: Diagnosis not present

## 2024-02-02 DIAGNOSIS — Z87891 Personal history of nicotine dependence: Secondary | ICD-10-CM

## 2024-02-02 DIAGNOSIS — F419 Anxiety disorder, unspecified: Secondary | ICD-10-CM

## 2024-02-02 DIAGNOSIS — L729 Follicular cyst of the skin and subcutaneous tissue, unspecified: Secondary | ICD-10-CM | POA: Diagnosis not present

## 2024-02-02 DIAGNOSIS — I10 Essential (primary) hypertension: Secondary | ICD-10-CM | POA: Diagnosis not present

## 2024-02-02 DIAGNOSIS — E7801 Familial hypercholesterolemia: Secondary | ICD-10-CM | POA: Diagnosis not present

## 2024-02-02 DIAGNOSIS — Z8781 Personal history of (healed) traumatic fracture: Secondary | ICD-10-CM | POA: Insufficient documentation

## 2024-02-02 MED ORDER — SERTRALINE HCL 100 MG PO TABS: 100.0000 mg | ORAL_TABLET | Freq: Every day | ORAL | 4 refills | Status: DC

## 2024-02-02 MED ORDER — BUSPIRONE HCL 5 MG PO TABS: 5.0000 mg | ORAL_TABLET | Freq: Three times a day (TID) | ORAL | 4 refills | Status: DC | PRN

## 2024-02-02 MED ORDER — ALBUTEROL SULFATE HFA 108 (90 BASE) MCG/ACT IN AERS: INHALATION_SPRAY | RESPIRATORY_TRACT | 3 refills | Status: DC

## 2024-02-02 NOTE — Assessment & Plan Note (Signed)
 Chronic, stable.  BP at goal for her age on recheck.  Will continue diet focus and if needed in future restart HCTZ which has worked well in past, or add on ARB, avoid ACE due to COPD.  LABS: CBC, CMP.  Return in 6 months.

## 2024-02-02 NOTE — Assessment & Plan Note (Signed)
 Refer to depression plan of care.

## 2024-02-02 NOTE — Assessment & Plan Note (Signed)
 Quit over 30 years ago, recommend continued cessation.

## 2024-02-02 NOTE — Assessment & Plan Note (Signed)
 To anterior right shoulder with no s/s infection.  No discomfort with this.  Suspect sebaceous cyst.  Monitor closely and I&D if needed in future.

## 2024-02-02 NOTE — Assessment & Plan Note (Signed)
 Chronic, ongoing.  Continue current medication regimen and adjust as needed. Lipid panel today.

## 2024-02-02 NOTE — Assessment & Plan Note (Signed)
 Chronic, ongoing. Spirometry July 2023 = FEV1 52% FEV1/FVC 113%. Continue Stiolto daily and Albuterol as needed.  Continue Singulair and Claritin for allergies as she is tolerating well.  Return in 6 months.  Spirometry next visit.  Overall exacerbations have reduced on current inhaler regimen.

## 2024-02-02 NOTE — Assessment & Plan Note (Signed)
Chronic, stable.  Will continue Sertraline and Buspar at this time.  Buspar safer alternative to take for increased anxiety.  Denies SI/HI.  Return to office in 6 months.

## 2024-02-02 NOTE — Progress Notes (Addendum)
 BP 118/64 (BP Location: Left Arm, Patient Position: Sitting, Cuff Size: Normal)   Pulse 69   Temp 98.2 F (36.8 C) (Oral)   Ht 4' 11.5" (1.511 m)   Wt 131 lb (59.4 kg)   SpO2 98%   BMI 26.02 kg/m    Subjective:    Patient ID: Shelly Bridges, female    DOB: 1946/12/02, 78 y.o.   MRN: [id number]  HPI: Shelly Bridges is a 78 y.o. female  Chief Complaint  Patient presents with   COPD   Hyperlipidemia   Hypertension   Depression   HYPERTENSION / HYPERLIPIDEMIA No current medications for HTN, taking Rosuvastatin for HLD.  EF June 2024 60-65%. Satisfied with current treatment? yes Duration of hypertension: years BP monitoring frequency: rarely BP range:  BP medication side effects: no medication Duration of hyperlipidemia: chronic Cholesterol medication side effects: no Cholesterol supplements: none Medication compliance: excellent compliance Aspirin: no Recent stressors: no Recurrent headaches: no Visual changes: no Palpitations: no Dyspnea: yes Chest pain: no Lower extremity edema: no Dizzy/lightheaded: no   OSTEOPENIA DEXA screening, last 12/20/21.  T-score went from -1.2 to -2.3 last check. Satisfied with current treatment?: yes Adequate calcium & vitamin D: yes Weight bearing exercises: no   COPD Uses Stiolto and Albuterol.  Singulair and Claritin for allergies. Smoked in past. COPD status: stable Satisfied with current treatment?: yes Oxygen use: no Dyspnea frequency: no Cough frequency: no Rescue inhaler frequency: not very often Limitation of activity: yes Productive cough: none Last Spirometry: 06/20/2022 - FEV1 52% and FEV1/FVC 113%  Pneumovax: Up to Date Influenza: Up to Date   DEPRESSION Continues on Sertraline and Buspar daily.  Daughter is now home and this reduces stress. Mood status: stable Satisfied with current treatment?: yes Symptom severity: moderate  Duration of current treatment : chronic Side effects:  no Medication compliance: good compliance Psychotherapy/counseling: none Depressed mood: no Anxious mood: no Anhedonia: no Significant weight loss or gain: no Insomnia: sleeping in chair due to recent arm fracture Fatigue: no Feelings of worthlessness or guilt: no Impaired concentration/indecisiveness: no Suicidal ideations: no Hopelessness: no Crying spells: no    02/02/2024    9:56 AM 08/04/2023    2:02 PM 04/05/2023   11:19 AM 03/29/2023   10:23 AM 02/21/2023   10:11 AM  Depression screen PHQ 2/9  Decreased Interest 0 0 0 0 1  Down, Depressed, Hopeless 0 1 0 0 1  PHQ - 2 Score 0 1 0 0 2  Altered sleeping 1 0 0 0 1  Tired, decreased energy 1 1 0 0 1  Change in appetite 1 0 0 0 1  Feeling bad or failure about yourself  1 0 0 0 1  Trouble concentrating 1 0 0 0 1  Moving slowly or fidgety/restless 1 0 0 0 1  Suicidal thoughts 0 0 0 0 0  PHQ-9 Score 6 2 0 0 8  Difficult doing work/chores  Not difficult at all Not difficult at all Not difficult at all Not difficult at all       02/02/2024    9:57 AM 08/04/2023    2:03 PM 04/05/2023   11:19 AM 03/29/2023   10:23 AM  GAD 7 : Generalized Anxiety Score  Nervous, Anxious, on Edge 1 3 0 1  Control/stop worrying 1 3 3 1   Worry too much - different things 1 3 3 2   Trouble relaxing 1 1 0 1  Restless 1 3 0 1  Easily annoyed or  irritable 0 1 0 0  Afraid - awful might happen 2 3 0 1  Total GAD 7 Score 7 17 6 7   Anxiety Difficulty Not difficult at all Not difficult at all Not difficult at all    Relevant past medical, surgical, family and social history reviewed and updated as indicated. Interim medical history since our last visit reviewed. Allergies and medications reviewed and updated.  Review of Systems  Constitutional:  Negative for activity change, appetite change, diaphoresis, fatigue and fever.  Respiratory:  Negative for cough, chest tightness, shortness of breath and wheezing.   Cardiovascular:  Negative for chest pain,  palpitations and leg swelling.  Gastrointestinal: Negative.   Neurological: Negative.   Psychiatric/Behavioral: Negative.     Per HPI unless specifically indicated above     Objective:    BP 118/64 (BP Location: Left Arm, Patient Position: Sitting, Cuff Size: Normal)   Pulse 69   Temp 98.2 F (36.8 C) (Oral)   Ht 4' 11.5" (1.511 m)   Wt 131 lb (59.4 kg)   SpO2 98%   BMI 26.02 kg/m   Wt Readings from Last 3 Encounters:  02/02/24 131 lb (59.4 kg)  08/04/23 130 lb 6.4 oz (59.1 kg)  04/05/23 127 lb 8 oz (57.8 kg)    Physical Exam Vitals and nursing note reviewed.  Constitutional:      General: She is awake. She is not in acute distress.    Appearance: She is well-developed and well-groomed. She is not ill-appearing or toxic-appearing.  HENT:     Head: Normocephalic.     Right Ear: Hearing and external ear normal.     Left Ear: Hearing and external ear normal.  Eyes:     General: Lids are normal.        Right eye: No discharge.        Left eye: No discharge.     Conjunctiva/sclera: Conjunctivae normal.     Pupils: Pupils are equal, round, and reactive to light.  Neck:     Thyroid: No thyromegaly.     Vascular: No carotid bruit.  Cardiovascular:     Rate and Rhythm: Normal rate and regular rhythm.     Heart sounds: Normal heart sounds. No murmur heard.    No gallop.  Pulmonary:     Effort: Pulmonary effort is normal. No accessory muscle usage or respiratory distress.     Breath sounds: Normal breath sounds.  Abdominal:     General: Bowel sounds are normal. There is no distension.     Palpations: Abdomen is soft.     Tenderness: There is no abdominal tenderness.  Musculoskeletal:     Cervical back: Normal range of motion and neck supple.     Right lower leg: No edema.     Left lower leg: No edema.  Lymphadenopathy:     Cervical: No cervical adenopathy.  Skin:    General: Skin is warm and dry.       Neurological:     Mental Status: She is alert and oriented to  person, place, and time.     Deep Tendon Reflexes: Reflexes are normal and symmetric.     Reflex Scores:      Brachioradialis reflexes are 2+ on the right side and 2+ on the left side.      Patellar reflexes are 2+ on the right side and 2+ on the left side. Psychiatric:        Attention and Perception: Attention normal.  Mood and Affect: Mood normal.        Speech: Speech normal.        Behavior: Behavior normal. Behavior is cooperative.        Thought Content: Thought content normal.    Results for orders placed or performed in visit on 08/04/23  TSH   Collection Time: 08/04/23  2:46 PM  Result Value Ref Range   TSH 3.090 0.450 - 4.500 uIU/mL  Lipid Panel w/o Chol/HDL Ratio   Collection Time: 08/04/23  2:46 PM  Result Value Ref Range   Cholesterol, Total 132 100 - 199 mg/dL   Triglycerides 52 0 - 149 mg/dL   HDL 67 >40 mg/dL   VLDL Cholesterol Cal 12 5 - 40 mg/dL   LDL Chol Calc (NIH) 53 0 - 99 mg/dL  Comprehensive metabolic panel   Collection Time: 08/04/23  2:46 PM  Result Value Ref Range   Glucose 85 70 - 99 mg/dL   BUN 10 8 - 27 mg/dL   Creatinine, Ser 9.81 0.57 - 1.00 mg/dL   eGFR 71 >19 JY/NWG/9.56   BUN/Creatinine Ratio 12 12 - 28   Sodium 139 134 - 144 mmol/L   Potassium 4.1 3.5 - 5.2 mmol/L   Chloride 100 96 - 106 mmol/L   CO2 25 20 - 29 mmol/L   Calcium 9.3 8.7 - 10.3 mg/dL   Total Protein 6.8 6.0 - 8.5 g/dL   Albumin 4.3 3.8 - 4.8 g/dL   Globulin, Total 2.5 1.5 - 4.5 g/dL   Bilirubin Total 0.5 0.0 - 1.2 mg/dL   Alkaline Phosphatase 97 44 - 121 IU/L   AST 22 0 - 40 IU/L   ALT 14 0 - 32 IU/L  CBC with Differential/Platelet   Collection Time: 08/04/23  2:46 PM  Result Value Ref Range   WBC 5.0 3.4 - 10.8 x10E3/uL   RBC 3.99 3.77 - 5.28 x10E6/uL   Hemoglobin 12.0 11.1 - 15.9 g/dL   Hematocrit 21.3 08.6 - 46.6 %   MCV 94 79 - 97 fL   MCH 30.1 26.6 - 33.0 pg   MCHC 32.0 31.5 - 35.7 g/dL   RDW 57.8 (L) 46.9 - 62.9 %   Platelets 147 (L) 150 - 450  x10E3/uL   Neutrophils 67 Not Estab. %   Lymphs 24 Not Estab. %   Monocytes 6 Not Estab. %   Eos 2 Not Estab. %   Basos 1 Not Estab. %   Neutrophils Absolute 3.3 1.4 - 7.0 x10E3/uL   Lymphocytes Absolute 1.2 0.7 - 3.1 x10E3/uL   Monocytes Absolute 0.3 0.1 - 0.9 x10E3/uL   EOS (ABSOLUTE) 0.1 0.0 - 0.4 x10E3/uL   Basophils Absolute 0.0 0.0 - 0.2 x10E3/uL   Immature Granulocytes 0 Not Estab. %   Immature Grans (Abs) 0.0 0.0 - 0.1 x10E3/uL  VITAMIN D 25 Hydroxy (Vit-D Deficiency, Fractures)   Collection Time: 08/04/23  2:46 PM  Result Value Ref Range   Vit D, 25-Hydroxy 43.3 30.0 - 100.0 ng/mL  T4, free   Collection Time: 08/04/23  2:46 PM  Result Value Ref Range   Free T4 1.10 0.82 - 1.77 ng/dL      Assessment & Plan:   Problem List Items Addressed This Visit       Cardiovascular and Mediastinum   Essential hypertension, benign   Chronic, stable.  BP at goal for her age on recheck.  Will continue diet focus and if needed in future restart HCTZ which has worked well  in past, or add on ARB, avoid ACE due to COPD.  LABS: CBC, CMP.  Return in 6 months.      Relevant Orders   CBC with Differential/Platelet     Respiratory   COPD (chronic obstructive pulmonary disease) (HCC) - Primary   Chronic, ongoing. Spirometry July 2023 = FEV1 52% FEV1/FVC 113%. Continue Stiolto daily and Albuterol as needed.  Continue Singulair and Claritin for allergies as she is tolerating well.  Return in 6 months.  Spirometry next visit.  Overall exacerbations have reduced on current inhaler regimen.      Relevant Medications   albuterol (VENTOLIN HFA) 108 (90 Base) MCG/ACT inhaler   Other Relevant Orders   CBC with Differential/Platelet     Musculoskeletal and Integument   Cyst of skin   To anterior right shoulder with no s/s infection.  No discomfort with this.  Suspect sebaceous cyst.  Monitor closely and I&D if needed in future.        Other   Anxiety   Refer to depression plan of care.       Relevant Medications   busPIRone (BUSPAR) 5 MG tablet   sertraline (ZOLOFT) 100 MG tablet   Depression, recurrent (HCC)   Chronic, stable.  Will continue Sertraline and Buspar at this time.  Buspar safer alternative to take for increased anxiety.  Denies SI/HI.  Return to office in 6 months.      Relevant Medications   busPIRone (BUSPAR) 5 MG tablet   sertraline (ZOLOFT) 100 MG tablet   Former light tobacco smoker   Quit over 30 years ago, recommend continued cessation.      Hyperlipidemia   Chronic, ongoing.  Continue current medication regimen and adjust as needed.  Lipid panel today.      Relevant Orders   Comprehensive metabolic panel   Lipid Panel w/o Chol/HDL Ratio     Follow up plan: Return in about 6 months (around 08/01/2024) for Annual Physical -- after 08/03/24 -- needs spirometry.

## 2024-02-03 ENCOUNTER — Encounter: Payer: Self-pay | Admitting: Nurse Practitioner

## 2024-02-03 LAB — CBC WITH DIFFERENTIAL/PLATELET
Basophils Absolute: 0 10*3/uL (ref 0.0–0.2)
Basos: 1 %
EOS (ABSOLUTE): 0.2 10*3/uL (ref 0.0–0.4)
Eos: 5 %
Hematocrit: 36.1 % (ref 34.0–46.6)
Hemoglobin: 11.8 g/dL (ref 11.1–15.9)
Immature Grans (Abs): 0 10*3/uL (ref 0.0–0.1)
Immature Granulocytes: 0 %
Lymphocytes Absolute: 1.2 10*3/uL (ref 0.7–3.1)
Lymphs: 30 %
MCH: 31.6 pg (ref 26.6–33.0)
MCHC: 32.7 g/dL (ref 31.5–35.7)
MCV: 97 fL (ref 79–97)
Monocytes Absolute: 0.2 10*3/uL (ref 0.1–0.9)
Monocytes: 6 %
Neutrophils Absolute: 2.4 10*3/uL (ref 1.4–7.0)
Neutrophils: 58 %
Platelets: 144 10*3/uL — ABNORMAL LOW (ref 150–450)
RBC: 3.73 x10E6/uL — ABNORMAL LOW (ref 3.77–5.28)
RDW: 11.9 % (ref 11.7–15.4)
WBC: 4.1 10*3/uL (ref 3.4–10.8)

## 2024-02-03 LAB — COMPREHENSIVE METABOLIC PANEL
ALT: 16 IU/L (ref 0–32)
AST: 23 IU/L (ref 0–40)
Albumin: 4.2 g/dL (ref 3.8–4.8)
Alkaline Phosphatase: 114 IU/L (ref 44–121)
BUN/Creatinine Ratio: 9 — ABNORMAL LOW (ref 12–28)
BUN: 7 mg/dL — ABNORMAL LOW (ref 8–27)
Bilirubin Total: 0.5 mg/dL (ref 0.0–1.2)
CO2: 26 mmol/L (ref 20–29)
Calcium: 9.4 mg/dL (ref 8.7–10.3)
Chloride: 101 mmol/L (ref 96–106)
Creatinine, Ser: 0.81 mg/dL (ref 0.57–1.00)
Globulin, Total: 2.4 g/dL (ref 1.5–4.5)
Glucose: 72 mg/dL (ref 70–99)
Potassium: 4.3 mmol/L (ref 3.5–5.2)
Sodium: 141 mmol/L (ref 134–144)
Total Protein: 6.6 g/dL (ref 6.0–8.5)
eGFR: 75 mL/min/{1.73_m2} (ref >59)

## 2024-02-03 LAB — LIPID PANEL W/O CHOL/HDL RATIO
Cholesterol, Total: 132 mg/dL (ref 100–199)
HDL: 70 mg/dL (ref >39)
LDL Chol Calc (NIH): 48 mg/dL (ref 0–99)
Triglycerides: 66 mg/dL (ref 0–149)
VLDL Cholesterol Cal: 14 mg/dL (ref 5–40)

## 2024-02-03 NOTE — Progress Notes (Signed)
 Contacted via MyChart   Good morning Shelly Bridges, your labs have returned: - Kidney function, creatinine and eGFR, remains normal, as is liver function, AST and ALT.  - CBC shows no anemia or infection.  Platelets remain a little low.  We will continue to monitor this.  Do not take Aspirin and try to avoid Ibuprofen. - Lipid panel shows levels at goal.  No medication changes needed.  Any questions? Keep being stellar!!  Thank you for allowing me to participate in your care.  I appreciate you. Kindest regards, Kaneesha Constantino

## 2024-02-12 ENCOUNTER — Ambulatory Visit (INDEPENDENT_AMBULATORY_CARE_PROVIDER_SITE_OTHER): Payer: Medicare PPO

## 2024-02-12 VITALS — BP 118/64 | Ht 59.5 in | Wt 131.0 lb

## 2024-02-12 DIAGNOSIS — Z0000 Encounter for general adult medical examination without abnormal findings: Secondary | ICD-10-CM | POA: Diagnosis not present

## 2024-02-12 DIAGNOSIS — Z1231 Encounter for screening mammogram for malignant neoplasm of breast: Secondary | ICD-10-CM

## 2024-02-12 DIAGNOSIS — Z780 Asymptomatic menopausal state: Secondary | ICD-10-CM

## 2024-02-12 NOTE — Progress Notes (Signed)
 Because this visit was a virtual/telehealth visit,  certain criteria was not obtained, such a blood pressure, CBG if applicable, and timed get up and go. Any medications not marked as "taking" were not mentioned during the medication reconciliation part of the visit. Any vitals not documented were not able to be obtained due to this being a telehealth visit or patient was unable to self-report a recent blood pressure reading due to a lack of equipment at home via telehealth. Vitals that have been documented are verbally provided by the patient.   Subjective:   Shelly Bridges is a 78 y.o. who presents for a Medicare Wellness preventive visit.  Visit Complete: Virtual I connected with  Shelly Bridges on 02/12/24 by a audio enabled telemedicine application and verified that I am speaking with the correct person using two identifiers.  Patient Location: Home  Provider Location: Home Office  I discussed the limitations of evaluation and management by telemedicine. The patient expressed understanding and agreed to proceed.  Vital Signs: Because this visit was a virtual/telehealth visit, some criteria may be missing or patient reported. Any vitals not documented were not able to be obtained and vitals that have been documented are patient reported.  VideoDeclined- This patient declined Librarian, academic. Therefore the visit was completed with audio only.  AWV Questionnaire: No: Patient Medicare AWV questionnaire was not completed prior to this visit.  Cardiac Risk Factors include: advanced age (>31men, >20 women);sedentary lifestyle;Other (see comment), Risk factor comments: COPD     Objective:    Today's Vitals   02/05/24 1536 02/12/24 0936  BP:  118/64  Weight:  131 lb (59.4 kg)  Height:  4' 11.5" (1.511 m)  PainSc: 0-No pain    Body mass index is 26.02 kg/m.     02/12/2024    1:07 PM 02/06/2023   10:08 AM 11/08/2021   11:21 AM 11/06/2020    10:42 AM 10/28/2019    8:22 AM 10/25/2018    8:03 AM 10/11/2017   10:53 AM  Advanced Directives  Does Patient Have a Medical Advance Directive? No No No No No Yes Yes  Type of Careers adviser;Living will   Does patient want to make changes to medical advance directive?      Yes (MAU/Ambulatory/Procedural Areas - Information given) Yes (MAU/Ambulatory/Procedural Areas - Information given)  Copy of Healthcare Power of Attorney in Chart?      No - copy requested   Would patient like information on creating a medical advance directive? No - Patient declined No - Patient declined No - Patient declined        Current Medications (verified) Outpatient Encounter Medications as of 02/12/2024  Medication Sig   albuterol (PROVENTIL) (2.5 MG/3ML) 0.083% nebulizer solution Take 3 mLs (2.5 mg total) by nebulization every 6 (six) hours as needed for wheezing or shortness of breath.   albuterol (VENTOLIN HFA) 108 (90 Base) MCG/ACT inhaler INHALE 2 PUFFS INTO THE LUNGS EVERY 4 HOURS AS NEEDED FOR WHEEZING OR SHORTNESS OF BREATH   busPIRone (BUSPAR) 5 MG tablet Take 1 tablet (5 mg total) by mouth 3 (three) times daily as needed.   Nebulizers (EASY AIR COMPRESSOR NEBULIZER) MISC Use to inhaler nebulizer treatments at needed per instructions on nebulizer prescription   rosuvastatin (CRESTOR) 20 MG tablet Take 1 tablet (20 mg total) by mouth daily.   sertraline (ZOLOFT) 100 MG tablet Take 1 tablet (100 mg total) by mouth  daily.   Tiotropium Bromide-Olodaterol (STIOLTO RESPIMAT) 2.5-2.5 MCG/ACT AERS Inhale 2 puffs into the lungs daily.   traMADol (ULTRAM) 50 MG tablet Take 50 mg by mouth every 6 (six) hours as needed for severe pain (pain score 7-10).   Vitamin D, Ergocalciferol, 50 MCG (2000 UT) CAPS Take 2,000 Units by mouth. 1 x day   No facility-administered encounter medications on file as of 02/12/2024.    Allergies (verified) Patient has no known allergies.    History: Past Medical History:  Diagnosis Date   Allergy Always   Hayfever   Anxiety    Asthma    Cataract 2021   Beginning of some   COPD (chronic obstructive pulmonary disease) (HCC)    Mild   Lipoma    Neutropenia (HCC)    Osteoporosis    Vitamin D deficiency disease    Past Surgical History:  Procedure Laterality Date   CATARACT EXTRACTION Bilateral 03/2022   EYE SURGERY  2023   Family History  Problem Relation Age of Onset   Dementia Mother    Hypertension Mother    Osteoporosis Mother    Stroke Mother        mini stroke   Varicose Veins Mother    Cancer Father        brain tumor and lung   Hypertension Father    Emphysema Father    COPD Neg Hx    Heart disease Neg Hx    Social History   Socioeconomic History   Marital status: Divorced    Spouse name: Not on file   Number of children: 3   Years of education: Not on file   Highest education level: Some college, no degree  Occupational History   Not on file  Tobacco Use   Smoking status: Former    Current packs/day: 0.00    Average packs/day: 1 pack/day for 20.0 years (20.0 ttl pk-yrs)    Types: Cigarettes    Start date: 06/17/1965    Quit date: 06/17/1985    Years since quitting: 38.6   Smokeless tobacco: Never  Vaping Use   Vaping status: Never Used  Substance and Sexual Activity   Alcohol use: No   Drug use: No   Sexual activity: Not Currently  Other Topics Concern   Not on file  Social History Narrative   Not on file   Social Drivers of Health   Financial Resource Strain: Low Risk  (02/12/2024)   Overall Financial Resource Strain (CARDIA)    Difficulty of Paying Living Expenses: Not hard at all  Food Insecurity: No Food Insecurity (02/12/2024)   Hunger Vital Sign    Worried About Running Out of Food in the Last Year: Never true    Ran Out of Food in the Last Year: Never true  Transportation Needs: No Transportation Needs (02/12/2024)   PRAPARE - Scientist, research (physical sciences) (Medical): No    Lack of Transportation (Non-Medical): No  Physical Activity: Inactive (02/12/2024)   Exercise Vital Sign    Days of Exercise per Week: 0 days    Minutes of Exercise per Session: 0 min  Stress: No Stress Concern Present (02/12/2024)   [employer] of Occupational Health - Occupational Stress Questionnaire    Feeling of Stress : Not at all  Recent Concern: Stress - Stress Concern Present (02/01/2024)   [employer] of Occupational Health - Occupational Stress Questionnaire    Feeling of Stress : To some extent  Social Connections: Unknown (  02/12/2024)   Social Connection and Isolation Panel [NHANES]    Frequency of Communication with Friends and Family: More than three times a week    Frequency of Social Gatherings with Friends and Family: More than three times a week    Attends Religious Services: Patient declined    Database administrator or Organizations: No    Attends Engineer, structural: Never    Marital Status: Divorced    Tobacco Counseling Counseling given: Yes    Clinical Intake:  Pre-visit preparation completed: Yes  Pain : No/denies pain Pain Score: 0-No pain     BMI - recorded: 26.02 Nutritional Status: BMI 25 -29 Overweight Nutritional Risks: None Diabetes: No  How often do you need to have someone help you when you read instructions, pamphlets, or other written materials from your doctor or pharmacy?: 1 - Never  Interpreter Needed?: No  Information entered by :: A Joelynn Dust, CMA   Activities of Daily Living     02/05/2024    3:36 PM 08/04/2023    2:35 PM  In your present state of health, do you have any difficulty performing the following activities:  Hearing? 0 0  Vision? 0 0  Difficulty concentrating or making decisions? 0 0  Walking or climbing stairs? 0 0  Dressing or bathing? 0 0  Doing errands, shopping? 0 0  Preparing Food and eating ? N   Using the Toilet? N   In the past six months, have you  accidently leaked urine? N   Do you have problems with loss of bowel control? N   Managing your Medications? N   Managing your Finances? N     Patient Care Team: Marjie Skiff, NP as PCP - General (Nurse Practitioner)  Indicate any recent Medical Services you may have received from other than [facility] providers in the past year (date may be approximate).     Assessment:   This is a routine wellness examination for Shelly Bridges.  Hearing/Vision screen Hearing Screening - Comments:: Patient denies any hearing difficulties.   Vision Screening - Comments:: Wears rx glasses - up to date with routine eye exams  Patient sees Janee M[facility] at [facility] in [place]   Goals Addressed             This Visit's Progress    Patient Stated       To get full use of my arm after falling and breaking it in December 2024.        Depression Screen     02/12/2024    1:08 PM 02/02/2024    9:56 AM 08/04/2023    2:02 PM 04/05/2023   11:19 AM 03/29/2023   10:23 AM 02/21/2023   10:11 AM 02/06/2023    2:47 PM  PHQ 2/9 Scores  PHQ - 2 Score 0 0 1 0 0 2 2  PHQ- 9 Score 0 6 2 0 0 8 7    Fall Risk     02/05/2024    3:36 PM 08/04/2023    2:02 PM 04/05/2023   11:19 AM 03/29/2023   10:23 AM 02/06/2023    2:46 PM  Fall Risk   Falls in the past year? 1 1 1 1 1   Number falls in past yr: 0 0 1 1 1   Injury with Fall? 1 0 1  0  Risk for fall due to :  No Fall Risks History of fall(s) History of fall(s) History of fall(s)  Follow up  Falls evaluation completed Falls evaluation completed Falls evaluation completed Falls evaluation completed    MEDICARE RISK AT HOME:  Medicare Risk at Home Any stairs in or around the home?: (Patient-Rptd) No If so, are there any without handrails?: (Patient-Rptd) No Home free of loose throw rugs in walkways, pet beds, electrical cords, etc?: (Patient-Rptd) Yes Adequate lighting in your home to reduce risk of falls?: (Patient-Rptd) Yes Life alert?: (Patient-Rptd) No Use of a  cane, walker or w/c?: (Patient-Rptd) No Grab bars in the bathroom?: (Patient-Rptd) Yes Shower chair or bench in shower?: (Patient-Rptd) Yes Elevated toilet seat or a handicapped toilet?: (Patient-Rptd) Yes  TIMED UP AND GO:  Was the test performed?  No  Cognitive Function: 6CIT completed        02/12/2024    9:53 AM 02/06/2023   10:18 AM 11/06/2020   10:44 AM 10/25/2018    8:07 AM 10/11/2017   10:55 AM  6CIT Screen  What Year? 0 points 0 points 0 points 0 points 0 points  What month? 0 points 0 points 0 points 0 points 0 points  What time? 0 points 0 points 0 points 0 points 0 points  Count back from 20 0 points 0 points 0 points 0 points 0 points  Months in reverse 0 points 0 points 2 points 0 points 0 points  Repeat phrase 0 points 0 points 2 points 0 points 2 points  Total Score 0 points 0 points 4 points 0 points 2 points    Immunizations Immunization History  Administered Date(s) Administered   Fluad Quad(high Dose 65+) 09/19/2019, 08/05/2021, 01/16/2023   Fluad Trivalent(High Dose 65+) 08/04/2023   Influenza, High Dose Seasonal PF 10/25/2016, 10/11/2017, 10/05/2018   Influenza,inj,Quad PF,6+ Mos 10/26/2015   Influenza-Unspecified 10/08/2020   Moderna Sars-Covid-2 Vaccination 03/14/2020, 04/11/2020, 10/08/2020   Pneumococcal Conjugate-13 07/03/2014   Pneumococcal Polysaccharide-23 10/26/2015   Td 12/05/2008   Tdap 06/04/2018   Zoster Recombinant(Shingrix) 01/28/2019, 07/02/2019    Screening Tests Health Maintenance  Topic Date Due   MAMMOGRAM  10/22/2020   DEXA SCAN  12/21/2023   COVID-19 Vaccine (4 - 2024-25 season) 02/13/2024 (Originally 08/06/2023)   Medicare Annual Wellness (AWV)  02/11/2025   DTaP/Tdap/Td (3 - Td or Tdap) 06/04/2028   Pneumonia Vaccine 50+ Years old  Completed   INFLUENZA VACCINE  Completed   Hepatitis C Screening  Completed   Zoster Vaccines- Shingrix  Completed   HPV VACCINES  Aged Out   Fecal DNA (Cologuard)  Discontinued    Health  Maintenance  Health Maintenance Due  Topic Date Due   MAMMOGRAM  10/22/2020   DEXA SCAN  12/21/2023   Health Maintenance Items Addressed: Mammogram ordered, DEXA ordered  Additional Screening:  Vision Screening: Recommended annual ophthalmology exams for early detection of glaucoma and other disorders of the eye.  Dental Screening: Recommended annual dental exams for proper oral hygiene  Community Resource Referral / Chronic Care Management: CRR required this visit?  No   CCM required this visit?  No     Plan:     I have personally reviewed and noted the following in the patient's chart:   Medical and social history Use of alcohol, tobacco or illicit drugs  Current medications and supplements including opioid prescriptions. Patient is not currently taking opioid prescriptions. Functional ability and status Nutritional status Physical activity Advanced directives List of other physicians Hospitalizations, surgeries, and ER visits in previous 12 months Vitals Screenings to include cognitive, depression, and falls Referrals and appointments  In  addition, I have reviewed and discussed with patient certain preventive protocols, quality metrics, and best practice recommendations. A written personalized care plan for preventive services as well as general preventive health recommendations were provided to patient.     Jordan Hawks Breely Panik, CMA   02/12/2024   After Visit Summary: (MyChart) Due to this being a telephonic visit, the after visit summary with patients personalized plan was offered to patient via MyChart   Notes: Nothing significant to report at this time.

## 2024-02-12 NOTE — Patient Instructions (Signed)
 [name] , Thank you for taking time to come for your Medicare Wellness Visit. I appreciate your ongoing commitment to your health goals. Please review the following plan we discussed and let me know if I can assist you in the future.   Referrals/Orders/Follow-Ups/Clinician Recommendations:  Next Medicare Annual Wellness Visit:   February 18, 2025 at 8:00 am video visit.  Orders for a mammogram and bone density screening have been placed for you. When you're ready please call the number listed below to schedule those. They may be able to do both screenings on the same day  [facility] Address: [address], [place], [place] [address] Open ? Closes 5?PM Phone: [phone]   This is a list of the screening recommended for you and due dates:  Health Maintenance  Topic Date Due   Mammogram  10/22/2020   DEXA scan (bone density measurement)  12/21/2023   COVID-19 Vaccine (4 - 2024-25 season) 02/13/2024*   Medicare Annual Wellness Visit  02/11/2025   DTaP/Tdap/Td vaccine (3 - Td or Tdap) 06/04/2028   Pneumonia Vaccine  Completed   Flu Shot  Completed   Hepatitis C Screening  Completed   Zoster (Shingles) Vaccine  Completed   HPV Vaccine  Aged Out   Cologuard (Stool DNA test)  Discontinued  *Topic was postponed. The date shown is not the original due date.    Advanced directives: (ACP Link)Information on Advanced Care Planning can be found at [facility] of [place] Advance Health Care Directives Advance Health Care Directives. [email or web address]   Next Medicare Annual Wellness Visit scheduled for next year: yes  Understanding Your Risk for Falls Millions of people have serious injuries from falls each year. It is important to understand your risk of falling. Talk with your health care provider about your risk and what you can do to lower it. If you do have a serious fall, make sure to tell your provider. Falling once raises your  risk of falling again. How can falls affect me? Serious injuries from falls are common. These include: Broken bones, such as hip fractures. Head injuries, such as traumatic brain injuries (TBI) or concussions. A fear of falling can cause you to avoid activities and stay at home. This can make your muscles weaker and raise your risk for a fall. What can increase my risk? There are a number of risk factors that increase your risk for falling. The more risk factors you have, the higher your risk of falling. Serious injuries from a fall happen most often to people who are older than 78 years old. Teenagers and young adults ages 65-29 are also at higher risk. Common risk factors include: Weakness in the lower body. Being generally weak or confused due to long-term (chronic) illness. Dizziness or balance problems. Poor vision. Medicines that cause dizziness or drowsiness. These may include: Medicines for your blood pressure, heart, anxiety, insomnia, or swelling (edema). Pain medicines. Muscle relaxants. Other risk factors include: Drinking alcohol. Having had a fall in the past. Having foot pain or wearing improper footwear. Working at a dangerous job. Having any of the following in your home: Tripping hazards, such as floor clutter or loose rugs. Poor lighting. Pets. Having dementia or memory loss. What actions can I take to lower my risk of falling?     Physical activity Stay physically fit. Do strength and balance exercises. Consider taking a regular class to build strength and balance. Yoga and tai chi  are good options. Vision Have your eyes checked every year and your prescription for glasses or contacts updated as needed. Shoes and walking aids Wear non-skid shoes. Wear shoes that have rubber soles and low heels. Do not wear high heels. Do not walk around the house in socks or slippers. Use a cane or walker as told by your provider. Home safety Attach secure railings on  both sides of your stairs. Install grab bars for your bathtub, shower, and toilet. Use a non-skid mat in your bathtub or shower. Attach bath mats securely with double-sided, non-slip rug tape. Use good lighting in all rooms. Keep a flashlight near your bed. Make sure there is a clear path from your bed to the bathroom. Use night-lights. Do not use throw rugs. Make sure all carpeting is taped or tacked down securely. Remove all clutter from walkways and stairways, including extension cords. Repair uneven or broken steps and floors. Avoid walking on icy or slippery surfaces. Walk on the grass instead of on icy or slick sidewalks. Use ice melter to get rid of ice on walkways in the winter. Use a cordless phone. Questions to ask your health care provider Can you help me check my risk for a fall? Do any of my medicines make me more likely to fall? Should I take a vitamin D supplement? What exercises can I do to improve my strength and balance? Should I make an appointment to have my vision checked? Do I need a bone density test to check for weak bones (osteoporosis)? Would it help to use a cane or a walker? Where to find more information Centers for Disease Control and Prevention, STEADI: TonerPromos.no Community-Based Fall Prevention Programs: TonerPromos.no [employer] on Aging: BaseRingTones.pl Contact a health care provider if: You fall at home. You are afraid of falling at home. You feel weak, drowsy, or dizzy. This information is not intended to replace advice given to you by your health care provider. Make sure you discuss any questions you have with your health care provider. Document Revised: 07/25/2022 Document Reviewed: 07/25/2022 Elsevier Patient Education  2024 [employer].

## 2024-03-29 ENCOUNTER — Other Ambulatory Visit: Payer: Self-pay | Admitting: Nurse Practitioner

## 2024-03-29 NOTE — Telephone Encounter (Signed)
 Refilled 02/02/24 # 270 with 4 refills. Requested Prescriptions  Refused Prescriptions Disp Refills   busPIRone  (BUSPAR ) 5 MG tablet [Pharmacy Med Name: BUSPIRONE  5MG  TABLETS] 180 tablet     Sig: TAKE 1 TABLET(5 MG) BY MOUTH TWICE DAILY AS NEEDED     Psychiatry: Anxiolytics/Hypnotics - Non-controlled Passed - 03/29/2024  3:30 PM      Passed - Valid encounter within last 12 months    Recent Outpatient Visits           1 month ago Simple chronic bronchitis (HCC)   [facility], Lavelle Posey, NP       Future Appointments             In 4 months Cannady, Jolene T, NP [facility]

## 2024-04-14 NOTE — Patient Instructions (Signed)
Be Involved in Caring For Your Health:  Taking Medications When medications are taken as directed, they can greatly improve your health. But if they are not taken as prescribed, they may not work. In some cases, not taking them correctly can be harmful. To help ensure your treatment remains effective and safe, understand your medications and how to take them. Bring your medications to each visit for review by your provider.  Your lab results, notes, and after visit summary will be available on My Chart. We strongly encourage you to use this feature. If lab results are abnormal the clinic will contact you with the appropriate steps. If the clinic does not contact you assume the results are satisfactory. You can always view your results on My Chart. If you have questions regarding your health or results, please contact the clinic during office hours. You can also ask questions on My Chart.  We at [facility] are grateful that you chose [place] to provide your care. We strive to provide evidence-based and compassionate care and are always looking for feedback. If you get a survey from the clinic please complete this so we can hear your opinions.  Foot Pain Many things can cause foot pain. Common causes include injuries to the foot. The injuries include sprains or broken bones, or injuries that affect the nerves in the feet. Other causes of foot pain include arthritis, blisters, and bunions. To know what causes your foot pain, your health care provider will take a detailed history of your symptoms. They will also do a physical exam as well as imaging tests, such as X-ray or MRI. Follow these instructions at home: Managing pain, stiffness, and swelling  If told, put ice on the painful area. Put ice in a plastic bag. Place a towel between your skin and the bag. Leave the ice on for 20 minutes, 2-3 times a day. If your skin turns bright red, remove the ice right away to prevent skin damage. The  risk of damage is higher if you cannot feel pain, heat, or cold. Activity Do not stand or walk for long periods. Do stretches to relieve foot pain and stiffness as told by your provider. Do not lift anything that is heavier than 10 lb (4.5 kg), or the limit that you are told, until your provider says that it is safe. Lifting a lot of weight can put added pressure on your feet. Return to your normal activities as told by your provider. Ask your provider what activities are safe for you. Lifestyle Wear comfortable, supportive shoes that fit you well. Do not wear high heels. Keep your feet clean and dry. General instructions Take over-the-counter and prescription medicines only as told by your provider. Rub your foot gently. Pay attention to any changes in your symptoms. Let your provider know if symptoms become worse. Keep all follow-up visits. Your provider will want to monitor your progress. Contact a health care provider if: Your pain does not get better after a few days of treatment at home. Your pain gets worse. You cannot stand on your foot. Your foot or toes are swollen. Your foot is numb or tingling. Get help right away if: Your foot or toes turn white or blue. You have warmth and redness along your foot. This information is not intended to replace advice given to you by your health care provider. Make sure you discuss any questions you have with your health care provider. Document Revised: 12/15/2022 Document Reviewed: 08/23/2022 Elsevier Patient Education  2024 Elsevier Inc.

## 2024-04-19 ENCOUNTER — Encounter: Payer: Self-pay | Admitting: Nurse Practitioner

## 2024-04-19 ENCOUNTER — Ambulatory Visit: Admitting: Nurse Practitioner

## 2024-04-19 VITALS — BP 124/71 | HR 77 | Temp 98.0°F | Ht 59.5 in | Wt 126.6 lb

## 2024-04-19 DIAGNOSIS — L84 Corns and callosities: Secondary | ICD-10-CM | POA: Diagnosis not present

## 2024-04-19 DIAGNOSIS — M21612 Bunion of left foot: Secondary | ICD-10-CM | POA: Insufficient documentation

## 2024-04-19 NOTE — Assessment & Plan Note (Signed)
 On left foot between second and great toe which is causing discomfort and difficulty walking.  Referral to podiatry placed.  Used hand held Histofreezer on area today and recommended she monitor.  Use items like corn cushions over the counter and place over area to prevent pressure and pain.

## 2024-04-19 NOTE — Assessment & Plan Note (Signed)
 L>R.  On left foot is is causing significant change to great toe + pressure point.  Referral to podiatry.

## 2024-04-19 NOTE — Progress Notes (Signed)
 BP 124/71   Pulse 77   Temp 98 F (36.7 C) (Oral)   Ht 4' 11.5" (1.511 m)   Wt 126 lb 9.6 oz (57.4 kg)   SpO2 95%   BMI 25.14 kg/m    Subjective:    Patient ID: Shelly Bridges, female    DOB: [birth year], 78 y.o.   MRN: [id number]  HPI: Shelly Bridges is a 78 y.o. female  Chief Complaint  Patient presents with   Mass    Patient states she has noticed a bump on her second toe of her L foot that is rubbing on her big toe and making it hard to walk. States she first noticed this about 2 weeks.    SKIN MASS TO LEFT FOOT Noticed a bump to inside of left second toe 2 weeks ago. This rubs against her great toe and makes it hard to walk.  Pain with walking. Duration: weeks Involved foot: left Mechanism of injury: unknown Location: as above Onset: gradual  Severity: 10/10 at times Quality:  dull, aching, and throbbing Frequency: constant Radiation: no Aggravating factors: weight bearing, walking, and movement  Alleviating factors: nothing  Status: worse Treatments attempted: cushioned with bandages, triple abx, Zinc/Bactin  Relief with NSAIDs?:  No NSAIDs Taken Weakness with weight bearing or walking: no Morning stiffness: no Swelling: yes Redness: yes Bruising: no Paresthesias / decreased sensation: no  Fevers:no   Relevant past medical, surgical, family and social history reviewed and updated as indicated. Interim medical history since our last visit reviewed. Allergies and medications reviewed and updated.  Review of Systems  Constitutional:  Negative for activity change, appetite change, diaphoresis, fatigue and fever.  Respiratory:  Negative for cough, chest tightness, shortness of breath and wheezing.   Cardiovascular:  Negative for chest pain, palpitations and leg swelling.  Gastrointestinal: Negative.   Skin:  Positive for wound.  Neurological: Negative.   Psychiatric/Behavioral: Negative.      Per HPI unless specifically indicated  above     Objective:     BP 124/71   Pulse 77   Temp 98 F (36.7 C) (Oral)   Ht 4' 11.5" (1.511 m)   Wt 126 lb 9.6 oz (57.4 kg)   SpO2 95%   BMI 25.14 kg/m   Wt Readings from Last 3 Encounters:  04/19/24 126 lb 9.6 oz (57.4 kg)  02/12/24 131 lb (59.4 kg)  02/02/24 131 lb (59.4 kg)    Physical Exam Vitals and nursing note reviewed.  Constitutional:      General: She is awake. She is not in acute distress.    Appearance: She is well-developed and well-groomed. She is not ill-appearing or toxic-appearing.  HENT:     Head: Normocephalic.     Right Ear: Hearing and external ear normal.     Left Ear: Hearing and external ear normal.  Eyes:     General: Lids are normal.        Right eye: No discharge.        Left eye: No discharge.     Conjunctiva/sclera: Conjunctivae normal.     Pupils: Pupils are equal, round, and reactive to light.  Neck:     Thyroid : No thyromegaly.     Vascular: No carotid bruit.  Cardiovascular:     Rate and Rhythm: Normal rate and regular rhythm.     Pulses:          Dorsalis pedis pulses are 2+ on the right side and 2+ on the left  side.       Posterior tibial pulses are 2+ on the right side and 2+ on the left side.     Heart sounds: Normal heart sounds. No murmur heard.    No gallop.  Pulmonary:     Effort: Pulmonary effort is normal. No accessory muscle usage or respiratory distress.     Breath sounds: Normal breath sounds.  Abdominal:     General: Bowel sounds are normal. There is no distension.     Palpations: Abdomen is soft.     Tenderness: There is no abdominal tenderness.  Musculoskeletal:     Cervical back: Normal range of motion and neck supple.     Right lower leg: No edema.     Left lower leg: No edema.     Right foot: Normal range of motion. Bunion (larger bunion) present.     Left foot: Normal range of motion. Bunion (mild) present.  Feet:     Right foot:     Protective Sensation: 10 sites tested.  10 sites sensed.     Skin  integrity: Skin integrity normal.     Toenail Condition: Right toenails are normal.     Left foot:     Protective Sensation: 10 sites tested.  10 sites sensed.     Skin integrity: Callus (to medial aspect of second toe, firm) present.     Toenail Condition: Left toenails are normal.     Comments: To medial aspect of second toe left foot there is a firm callus present, this is leaving pressure mark on lateral side of great toe.  Bunions both feet L>R. Histofreezer handheld used on callus area after patient consent. Lymphadenopathy:     Cervical: No cervical adenopathy.  Skin:    General: Skin is warm and dry.  Neurological:     Mental Status: She is alert and oriented to person, place, and time.     Deep Tendon Reflexes: Reflexes are normal and symmetric.     Reflex Scores:      Brachioradialis reflexes are 2+ on the right side and 2+ on the left side.      Patellar reflexes are 2+ on the right side and 2+ on the left side. Psychiatric:        Attention and Perception: Attention normal.        Mood and Affect: Mood normal.        Speech: Speech normal.        Behavior: Behavior normal. Behavior is cooperative.        Thought Content: Thought content normal.     Results for orders placed or performed in visit on 02/02/24  Comprehensive metabolic panel   Collection Time: 02/02/24 10:18 AM  Result Value Ref Range   Glucose 72 70 - 99 mg/dL   BUN 7 (L) 8 - 27 mg/dL   Creatinine, Ser 1.61 0.57 - 1.00 mg/dL   eGFR 75 >09 UE/AVW/0.98   BUN/Creatinine Ratio 9 (L) 12 - 28   Sodium 141 134 - 144 mmol/L   Potassium 4.3 3.5 - 5.2 mmol/L   Chloride 101 96 - 106 mmol/L   CO2 26 20 - 29 mmol/L   Calcium  9.4 8.7 - 10.3 mg/dL   Total Protein 6.6 6.0 - 8.5 g/dL   Albumin 4.2 3.8 - 4.8 g/dL   Globulin, Total 2.4 1.5 - 4.5 g/dL   Bilirubin Total 0.5 0.0 - 1.2 mg/dL   Alkaline Phosphatase 114 44 - 121 IU/L   AST  23 0 - 40 IU/L   ALT 16 0 - 32 IU/L  Lipid Panel w/o Chol/HDL Ratio    Collection Time: 02/02/24 10:18 AM  Result Value Ref Range   Cholesterol, Total 132 100 - 199 mg/dL   Triglycerides 66 0 - 149 mg/dL   HDL 70 >29 mg/dL   VLDL Cholesterol Cal 14 5 - 40 mg/dL   LDL Chol Calc (NIH) 48 0 - 99 mg/dL  CBC with Differential/Platelet   Collection Time: 02/02/24 10:18 AM  Result Value Ref Range   WBC 4.1 3.4 - 10.8 x10E3/uL   RBC 3.73 (L) 3.77 - 5.28 x10E6/uL   Hemoglobin 11.8 11.1 - 15.9 g/dL   Hematocrit 93.7 16.9 - 46.6 %   MCV 97 79 - 97 fL   MCH 31.6 26.6 - 33.0 pg   MCHC 32.7 31.5 - 35.7 g/dL   RDW 67.8 93.8 - 10.1 %   Platelets 144 (L) 150 - 450 x10E3/uL   Neutrophils 58 Not Estab. %   Lymphs 30 Not Estab. %   Monocytes 6 Not Estab. %   Eos 5 Not Estab. %   Basos 1 Not Estab. %   Neutrophils Absolute 2.4 1.4 - 7.0 x10E3/uL   Lymphocytes Absolute 1.2 0.7 - 3.1 x10E3/uL   Monocytes Absolute 0.2 0.1 - 0.9 x10E3/uL   EOS (ABSOLUTE) 0.2 0.0 - 0.4 x10E3/uL   Basophils Absolute 0.0 0.0 - 0.2 x10E3/uL   Immature Granulocytes 0 Not Estab. %   Immature Grans (Abs) 0.0 0.0 - 0.1 x10E3/uL      Assessment & Plan:   Problem List Items Addressed This Visit       Musculoskeletal and Integument   Callus between toes   On left foot between second and great toe which is causing discomfort and difficulty walking.  Referral to podiatry placed.  Used hand held Histofreezer on area today and recommended she monitor.  Use items like corn cushions over the counter and place over area to prevent pressure and pain.      Relevant Orders   Ambulatory referral to Podiatry   Bunion of great toe of left foot - Primary   L>R.  On left foot is is causing significant change to great toe + pressure point.  Referral to podiatry.      Relevant Orders   Ambulatory referral to Podiatry     Follow up plan: Return if symptoms worsen or fail to improve.

## 2024-08-04 NOTE — Patient Instructions (Signed)
 Please call to schedule your mammogram and/or bone density: [facility] at [facility]  Address: [address], [place], [place] [address] Phone: [phone]   Imaging at [facility] [address] [place],  [place]  [address] Phone: [phone]    Be Involved in Caring For Your Health:  Taking Medications When medications are taken as directed, they can greatly improve your health. But if they are not taken as prescribed, they may not work. In some cases, not taking them correctly can be harmful. To help ensure your treatment remains effective and safe, understand your medications and how to take them. Bring your medications to each visit for review by your provider.  Your lab results, notes, and after visit summary will be available on My Chart. We strongly encourage you to use this feature. If lab results are abnormal the clinic will contact you with the appropriate steps. If the clinic does not contact you assume the results are satisfactory. You can always view your results on My Chart. If you have questions regarding your health or results, please contact the clinic during office hours. You can also ask questions on My Chart.  We at [facility] are grateful that you chose us  to provide your care. We strive to provide evidence-based and compassionate care and are always looking for feedback. If you get a survey from the clinic please complete this so we can hear your opinions.  Managing Anxiety, Adult After being diagnosed with anxiety, you may be relieved to know why you have felt or behaved a certain way. You may also feel overwhelmed about the treatment ahead and what it will mean for your life. With care and support, you can manage your anxiety. How to manage lifestyle changes Understanding the difference between stress and anxiety Although stress can play a role in anxiety, it is not the same as anxiety. Stress is  your body's reaction to life changes and events, both good and bad. Stress is often caused by something external, such as a deadline, test, or competition. It normally goes away after the event has ended and will last just a few hours. But, stress can be ongoing and can lead to more than just stress. Anxiety is caused by something internal, such as imagining a terrible outcome or worrying that something will go wrong that will greatly upset you. Anxiety often does not go away even after the event is over, and it can become a long-term (chronic) worry. Lowering stress and anxiety Talk with your health care provider or a counselor to learn more about lowering anxiety and stress. They may suggest tension-reduction techniques, such as: Music. Spend time creating or listening to music that you enjoy and that inspires you. Mindfulness-based meditation. Practice being aware of your normal breaths while not trying to control your breathing. It can be done while sitting or walking. Centering prayer. Focus on a word, phrase, or sacred image that means something to you and brings you peace. Deep breathing. Expand your stomach and inhale slowly through your nose. Hold your breath for 3-5 seconds. Then breathe out slowly, letting your stomach muscles relax. Self-talk. Learn to notice and spot thought patterns that lead to anxiety reactions. Change those patterns to thoughts that feel peaceful. Muscle relaxation. Take time to tense muscles and then relax them. Choose a tension-reduction technique that fits your lifestyle and personality. These techniques take time and practice. Set aside 5-15 minutes a day to do them.  Specialized therapists can offer counseling and training in these techniques. The training to help with anxiety may be covered by some insurance plans. Other things you can do to manage stress and anxiety include: Keeping a stress diary. This can help you learn what triggers your reaction and then learn  ways to manage your response. Thinking about how you react to certain situations. You may not be able to control everything, but you can control your response. Making time for activities that help you relax and not feeling guilty about spending your time in this way. Doing visual imagery. This involves imagining or creating mental pictures to help you relax. Practicing yoga. Through yoga poses, you can lower tension and relax.  Medicines Medicines for anxiety include: Antidepressant medicines. These are usually prescribed for long-term daily control. Anti-anxiety medicines. These may be added in severe cases, especially when panic attacks occur. When used together, medicines, psychotherapy, and tension-reduction techniques may be the most effective treatment. Relationships Relationships can play a big part in helping you recover. Spend more time connecting with trusted friends and family members. Think about going to couples counseling if you have a partner, taking family education classes, or going to family therapy. Therapy can help you and others better understand your anxiety. How to recognize changes in your anxiety Everyone responds differently to treatment for anxiety. Recovery from anxiety happens when symptoms lessen and stop interfering with your daily life at home or work. This may mean that you will start to: Have better concentration and focus. Worry will interfere less in your daily thinking. Sleep better. Be less irritable. Have more energy. Have improved memory. Try to recognize when your condition is getting worse. Contact your provider if your symptoms interfere with home or work and you feel like your condition is not improving. Follow these instructions at home: Activity Exercise. Adults should: Exercise for at least 150 minutes each week. The exercise should increase your heart rate and make you sweat (moderate-intensity exercise). Do strengthening exercises at least  twice a week. Get the right amount and quality of sleep. Most adults need 7-9 hours of sleep each night. Lifestyle  Eat a healthy diet that includes plenty of vegetables, fruits, whole grains, low-fat dairy products, and lean protein. Do not eat a lot of foods that are high in fats, added sugars, or salt (sodium). Make choices that simplify your life. Do not use any products that contain nicotine or tobacco. These products include cigarettes, chewing tobacco, and vaping devices, such as e-cigarettes. If you need help quitting, ask your provider. Avoid caffeine, alcohol, and certain over-the-counter cold medicines. These may make you feel worse. Ask your pharmacist which medicines to avoid. General instructions Take over-the-counter and prescription medicines only as told by your provider. Keep all follow-up visits. This is to make sure you are managing your anxiety well or if you need more support. Where to find support You can get help and support from: Self-help groups. Online and [employer]. A trusted spiritual leader. Couples counseling. Family education classes. Family therapy. Where to find more information You may find that joining a support group helps you deal with your anxiety. The following sources can help you find counselors or support groups near you: Mental Health America: mentalhealthamerica.net Anxiety and Depression Association of [place] (ADAA): adaa.org The [employer] on Mental Illness (NAMI): nami.org Contact a health care provider if: You have a hard time staying focused or finishing tasks. You spend many hours a day feeling worried about everyday life.  You are very tired because you cannot stop worrying. You start to have headaches or often feel tense. You have chronic nausea or diarrhea. Get help right away if: Your heart feels like it is racing. You have shortness of breath. You have thoughts of hurting yourself or others. Get help right  away if you feel like you may hurt yourself or others, or have thoughts about taking your own life. Go to your nearest emergency room or: Call 911. Call the National Suicide Prevention Lifeline at 973-645-0312 or 988. This is open 24 hours a day. Text the Crisis Text Line at 309-543-7224. This information is not intended to replace advice given to you by your health care provider. Make sure you discuss any questions you have with your health care provider. Document Revised: 08/30/2022 Document Reviewed: 03/14/2021 Elsevier Patient Education  2024 [employer].

## 2024-08-06 ENCOUNTER — Encounter: Payer: Self-pay | Admitting: Nurse Practitioner

## 2024-08-06 ENCOUNTER — Ambulatory Visit: Payer: Medicare PPO | Admitting: Nurse Practitioner

## 2024-08-06 VITALS — BP 118/68 | HR 66 | Temp 98.8°F | Resp 18 | Ht 59.0 in | Wt 128.0 lb

## 2024-08-06 DIAGNOSIS — M85852 Other specified disorders of bone density and structure, left thigh: Secondary | ICD-10-CM

## 2024-08-06 DIAGNOSIS — Z0000 Encounter for general adult medical examination without abnormal findings: Secondary | ICD-10-CM | POA: Diagnosis not present

## 2024-08-06 DIAGNOSIS — F419 Anxiety disorder, unspecified: Secondary | ICD-10-CM

## 2024-08-06 DIAGNOSIS — R7989 Other specified abnormal findings of blood chemistry: Secondary | ICD-10-CM | POA: Diagnosis not present

## 2024-08-06 DIAGNOSIS — Z23 Encounter for immunization: Secondary | ICD-10-CM | POA: Diagnosis not present

## 2024-08-06 DIAGNOSIS — F339 Major depressive disorder, recurrent, unspecified: Secondary | ICD-10-CM | POA: Diagnosis not present

## 2024-08-06 DIAGNOSIS — J410 Simple chronic bronchitis: Secondary | ICD-10-CM | POA: Diagnosis not present

## 2024-08-06 DIAGNOSIS — I10 Essential (primary) hypertension: Secondary | ICD-10-CM

## 2024-08-06 DIAGNOSIS — Z1231 Encounter for screening mammogram for malignant neoplasm of breast: Secondary | ICD-10-CM

## 2024-08-06 DIAGNOSIS — E7801 Familial hypercholesterolemia: Secondary | ICD-10-CM

## 2024-08-06 DIAGNOSIS — Z87891 Personal history of nicotine dependence: Secondary | ICD-10-CM

## 2024-08-06 LAB — MICROALBUMIN, URINE WAIVED
Creatinine, Urine Waived: 100 mg/dL (ref 10–300)
Microalb, Ur Waived: 80 mg/L — ABNORMAL HIGH (ref 0–19)

## 2024-08-06 MED ORDER — MONTELUKAST SODIUM 10 MG PO TABS: 10.0000 mg | ORAL_TABLET | Freq: Every day | ORAL | 4 refills | Status: AC

## 2024-08-06 MED ORDER — SERTRALINE HCL 100 MG PO TABS: 100.0000 mg | ORAL_TABLET | Freq: Every day | ORAL | 4 refills | Status: AC

## 2024-08-06 MED ORDER — BETAMETHASONE DIPROPIONATE 0.05 % EX CREA: TOPICAL_CREAM | Freq: Two times a day (BID) | CUTANEOUS | 2 refills | Status: AC | PRN

## 2024-08-06 MED ORDER — STIOLTO RESPIMAT 2.5-2.5 MCG/ACT IN AERS: 2.0000 | INHALATION_SPRAY | Freq: Every day | RESPIRATORY_TRACT | 12 refills | Status: DC

## 2024-08-06 MED ORDER — BUSPIRONE HCL 5 MG PO TABS
5.0000 mg | ORAL_TABLET | Freq: Three times a day (TID) | ORAL | 4 refills | Status: AC | PRN
Start: 2024-08-06 — End: ?

## 2024-08-06 MED ORDER — ROSUVASTATIN CALCIUM 20 MG PO TABS: 20.0000 mg | ORAL_TABLET | Freq: Every day | ORAL | 4 refills | Status: AC

## 2024-08-06 MED ORDER — ALBUTEROL SULFATE HFA 108 (90 BASE) MCG/ACT IN AERS: INHALATION_SPRAY | RESPIRATORY_TRACT | 3 refills | Status: AC

## 2024-08-06 NOTE — Assessment & Plan Note (Signed)
 Ongoing noted on recent DEXA, some decline from previous with T-score -2.3 and previous in 2015 was -1.2.  she did have fracture of left arm this year, recommend repeat DEXA with mammogram and provided number to call.  Take Vitamin D3 2000 units daily and intake adequate calcium  daily + perform weight bearing exercises. Check Vit D level today.

## 2024-08-06 NOTE — Assessment & Plan Note (Signed)
No current symptoms. Recheck TSH and Free T4.  

## 2024-08-06 NOTE — Assessment & Plan Note (Signed)
 Refer to depression plan of care.

## 2024-08-06 NOTE — Assessment & Plan Note (Signed)
 Quit over 30 years ago, recommend continued cessation.

## 2024-08-06 NOTE — Assessment & Plan Note (Signed)
 Chronic, stable.  BP at goal for her age on recheck.  Will continue diet focus and if needed in future restart HCTZ which has worked well in past, or add on ARB, avoid ACE due to COPD.  LABS: CBC, TSH, urine ALB, CMP.  Return in 6 months.

## 2024-08-06 NOTE — Progress Notes (Signed)
 BP 118/68 (BP Location: Right Arm)   Pulse 66   Temp 98.8 F (37.1 C) (Oral)   Resp 18   Ht 4' 11 (1.499 m)   Wt 128 lb (58.1 kg)   SpO2 98%   BMI 25.85 kg/m    Subjective:    Patient ID: Shelly Bridges, female    DOB: Nov 29, 1946, 78 y.o.   MRN: [id number]  HPI: Shelly Bridges is a 78 y.o. female presenting on 08/06/2024 for comprehensive medical examination. Current medical complaints include:none  She currently lives with: son Menopausal Symptoms: no  HYPERTENSION / HYPERLIPIDEMIA Taking Crestor .  No current medications for HTN.  EF June 2024 60-65%. Satisfied with current treatment? yes Duration of hypertension: years BP monitoring frequency: rarely BP range:  BP medication side effects: no medication Duration of hyperlipidemia: chronic Cholesterol medication side effects: no Cholesterol supplements: none Medication compliance: excellent compliance Aspirin: no Recent stressors: no Recurrent headaches: no Visual changes: no Palpitations: no Dyspnea: yes Chest pain: no Lower extremity edema: no Dizzy/lightheaded: no   ELEVATED TSH History of elevation in TSH, but recent have been stable. Fatigue: no Cold intolerance: no Heat intolerance: no Weight gain: no Weight loss: no Constipation: no Diarrhea/loose stools: no Palpitations: no Lower extremity edema: no Anxiety/depressed mood: occasional  OSTEOPENIA Recent noted ongoing osteopenia, but T-score went from -1.2 to -2.3 on check 12/20/21. History of fracture to left arm. Satisfied with current treatment?: yes Adequate calcium  & vitamin D : yes Weight bearing exercises: no   COPD Using Stiolto, Albuterol , Singulair  and Claritin for management. COPD status: stable Satisfied with current treatment?: yes Oxygen use: no Dyspnea frequency: no Cough frequency: no Rescue inhaler frequency: occasional with weather changes Limitation of activity: yes Productive cough: none Last Spirometry:  FEV1 52% and FEV1/FVC 113% -- 06/20/22 Pneumovax: Up to Date Influenza: Up to Date   DEPRESSION Taking Sertraline  and Buspar  daily. Lots of family stressors Mood status: stable Satisfied with current treatment?: yes Symptom severity: mild  Duration of current treatment : years Side effects: no Medication compliance: excellent compliance Psychotherapy/counseling: no  Depressed mood: yes Anxious mood: yes, mild Anhedonia: no Significant weight loss or gain: no Insomnia: no  Fatigue: no Feelings of worthlessness or guilt: no Impaired concentration/indecisiveness: no Suicidal ideations: no Hopelessness: no Crying spells: no    08/06/2024    9:52 AM 08/06/2024    9:32 AM 04/19/2024   10:55 AM 02/12/2024    1:08 PM 02/02/2024    9:56 AM  Depression screen PHQ 2/9  Decreased Interest 1 1 1  0 0  Down, Depressed, Hopeless 1 1 1  0 0  PHQ - 2 Score 2 2 2  0 0  Altered sleeping 1 1 2  0 1  Tired, decreased energy 1 1 1  0 1  Change in appetite 1 1 1  0 1  Feeling bad or failure about yourself  1 1 1  0 1  Trouble concentrating 1 1 1  0 1  Moving slowly or fidgety/restless 0 1 1 0 1  Suicidal thoughts 0 0 0 0 0  PHQ-9 Score 7 8 9  0 6  Difficult doing work/chores Not difficult at all  Somewhat difficult         08/06/2024    9:53 AM 08/06/2024    9:31 AM 04/19/2024   10:56 AM 02/02/2024    9:57 AM  GAD 7 : Generalized Anxiety Score  Nervous, Anxious, on Edge 1 0 1 1  Control/stop worrying 1 0 2 1  Worry too  much - different things 1 0 2 1  Trouble relaxing 1 0 1 1  Restless 1 0 1 1  Easily annoyed or irritable 1 0 1 0  Afraid - awful might happen 1 0 1 2  Total GAD 7 Score 7 0 9 7  Anxiety Difficulty Not difficult at all  Not difficult at all Not difficult at all   Functional Status Survey: Is the patient deaf or have difficulty hearing?: No Does the patient have difficulty seeing, even when wearing glasses/contacts?: No Does the patient have difficulty concentrating, remembering, or  making decisions?: No Does the patient have difficulty walking or climbing stairs?: No Does the patient have difficulty dressing or bathing?: No Does the patient have difficulty doing errands alone such as visiting a doctor's office or shopping?: No      03/29/2023   10:23 AM 04/05/2023   11:19 AM 08/04/2023    2:02 PM 02/05/2024    3:36 PM 04/19/2024   10:55 AM  Fall Risk  Falls in the past year? 1 1 1 1 1   Was there an injury with Fall?  1 0 1 0  Fall Risk Category Calculator  3 1 2  1   Patient at Risk for Falls Due to History of fall(s) History of fall(s) No Fall Risks  No Fall Risks  Fall risk Follow up Falls evaluation completed Falls evaluation completed Falls evaluation completed  Falls evaluation completed     Patient-reported    Past Medical History:  Past Medical History:  Diagnosis Date   Allergy Always   Hayfever   Anxiety    Asthma    Cataract 2021   Beginning of some   COPD (chronic obstructive pulmonary disease) (HCC)    Mild   Lipoma    Neutropenia (HCC)    Osteoporosis    Vitamin D  deficiency disease     Surgical History:  Past Surgical History:  Procedure Laterality Date   CATARACT EXTRACTION Bilateral 03/2022   EYE SURGERY  2023    Medications:  Current Outpatient Medications on File Prior to Visit  Medication Sig   albuterol  (PROVENTIL ) (2.5 MG/3ML) 0.083% nebulizer solution Take 3 mLs (2.5 mg total) by nebulization every 6 (six) hours as needed for wheezing or shortness of breath.   Nebulizers (EASY AIR COMPRESSOR NEBULIZER) MISC Use to inhaler nebulizer treatments at needed per instructions on nebulizer prescription   Vitamin D , Ergocalciferol , 50 MCG (2000 UT) CAPS Take 2,000 Units by mouth. 1 x day   No current facility-administered medications on file prior to visit.    Allergies:  No Known Allergies  Social History:  Social History   Socioeconomic History   Marital status: Divorced    Spouse name: Not on file   Number of children: 3    Years of education: Not on file   Highest education level: Some college, no degree  Occupational History   Not on file  Tobacco Use   Smoking status: Former    Current packs/day: 0.00    Average packs/day: 1 pack/day for 20.0 years (20.0 ttl pk-yrs)    Types: Cigarettes    Start date: 06/17/1965    Quit date: 06/17/1985    Years since quitting: 39.1   Smokeless tobacco: Never  Vaping Use   Vaping status: Never Used  Substance and Sexual Activity   Alcohol use: No   Drug use: No   Sexual activity: Not Currently  Other Topics Concern   Not on file  Social History  Narrative   Not on file   Social Drivers of Health   Financial Resource Strain: Low Risk  (02/12/2024)   Overall Financial Resource Strain (CARDIA)    Difficulty of Paying Living Expenses: Not hard at all  Food Insecurity: No Food Insecurity (02/12/2024)   Hunger Vital Sign    Worried About Running Out of Food in the Last Year: Never true    Ran Out of Food in the Last Year: Never true  Transportation Needs: No Transportation Needs (02/12/2024)   PRAPARE - Administrator, Civil Service (Medical): No    Lack of Transportation (Non-Medical): No  Physical Activity: Inactive (02/12/2024)   Exercise Vital Sign    Days of Exercise per Week: 0 days    Minutes of Exercise per Session: 0 min  Stress: No Stress Concern Present (02/12/2024)   [employer] of Occupational Health - Occupational Stress Questionnaire    Feeling of Stress : Not at all  Recent Concern: Stress - Stress Concern Present (02/01/2024)   [employer] of Occupational Health - Occupational Stress Questionnaire    Feeling of Stress : To some extent  Social Connections: Unknown (02/12/2024)   Social Connection and Isolation Panel    Frequency of Communication with Friends and Family: More than three times a week    Frequency of Social Gatherings with Friends and Family: More than three times a week    Attends Religious Services: Patient  declined    Database administrator or Organizations: No    Attends Banker Meetings: Never    Marital Status: Divorced  Catering manager Violence: Not At Risk (02/12/2024)   Humiliation, Afraid, Rape, and Kick questionnaire    Fear of Current or Ex-Partner: No    Emotionally Abused: No    Physically Abused: No    Sexually Abused: No   Social History   Tobacco Use  Smoking Status Former   Current packs/day: 0.00   Average packs/day: 1 pack/day for 20.0 years (20.0 ttl pk-yrs)   Types: Cigarettes   Start date: 06/17/1965   Quit date: 06/17/1985   Years since quitting: 39.1  Smokeless Tobacco Never   Social History   Substance and Sexual Activity  Alcohol Use No    Family History:  Family History  Problem Relation Age of Onset   Dementia Mother    Hypertension Mother    Osteoporosis Mother    Stroke Mother        mini stroke   Varicose Veins Mother    Cancer Father        brain tumor and lung   Hypertension Father    Emphysema Father    COPD Neg Hx    Heart disease Neg Hx     Past medical history, surgical history, medications, allergies, family history and social history reviewed with patient today and changes made to appropriate areas of the chart.   ROS All other ROS negative except what is listed above and in the HPI.      Objective:    BP 118/68 (BP Location: Right Arm)   Pulse 66   Temp 98.8 F (37.1 C) (Oral)   Resp 18   Ht 4' 11 (1.499 m)   Wt 128 lb (58.1 kg)   SpO2 98%   BMI 25.85 kg/m   Wt Readings from Last 3 Encounters:  08/06/24 128 lb (58.1 kg)  04/19/24 126 lb 9.6 oz (57.4 kg)  02/12/24 131 lb (59.4 kg)  Physical Exam Vitals and nursing note reviewed. Exam conducted with a chaperone present.  Constitutional:      General: She is awake. She is not in acute distress.    Appearance: She is well-developed and well-groomed. She is not ill-appearing or toxic-appearing.  HENT:     Head: Normocephalic and atraumatic.      Right Ear: Hearing, tympanic membrane, ear canal and external ear normal. No drainage.     Left Ear: Hearing, tympanic membrane, ear canal and external ear normal. No drainage.     Nose: Nose normal.     Right Sinus: No maxillary sinus tenderness or frontal sinus tenderness.     Left Sinus: No maxillary sinus tenderness or frontal sinus tenderness.     Mouth/Throat:     Mouth: Mucous membranes are moist.     Pharynx: Oropharynx is clear. Uvula midline. No pharyngeal swelling, oropharyngeal exudate or posterior oropharyngeal erythema.  Eyes:     General: Lids are normal.        Right eye: No discharge.        Left eye: No discharge.     Extraocular Movements: Extraocular movements intact.     Conjunctiva/sclera: Conjunctivae normal.     Pupils: Pupils are equal, round, and reactive to light.     Visual Fields: Right eye visual fields normal and left eye visual fields normal.  Neck:     Thyroid : No thyromegaly.     Vascular: No carotid bruit.     Trachea: Trachea normal.  Cardiovascular:     Rate and Rhythm: Normal rate and regular rhythm.     Heart sounds: Normal heart sounds. No murmur heard.    No gallop.  Pulmonary:     Effort: Pulmonary effort is normal. No accessory muscle usage or respiratory distress.     Breath sounds: Normal breath sounds.  Chest:  Breasts:    Right: Normal.     Left: Normal.  Abdominal:     General: Bowel sounds are normal.     Palpations: Abdomen is soft. There is no hepatomegaly or splenomegaly.     Tenderness: There is no abdominal tenderness.  Musculoskeletal:        General: Normal range of motion.     Cervical back: Normal range of motion and neck supple.     Right lower leg: No edema.     Left lower leg: No edema.  Lymphadenopathy:     Head:     Right side of head: No submental, submandibular, tonsillar, preauricular or posterior auricular adenopathy.     Left side of head: No submental, submandibular, tonsillar, preauricular or posterior  auricular adenopathy.     Cervical: No cervical adenopathy.     Upper Body:     Right upper body: No supraclavicular, axillary or pectoral adenopathy.     Left upper body: No supraclavicular, axillary or pectoral adenopathy.  Skin:    General: Skin is warm and dry.     Capillary Refill: Capillary refill takes less than 2 seconds.     Findings: No rash.  Neurological:     Mental Status: She is alert and oriented to person, place, and time.     Gait: Gait is intact.     Deep Tendon Reflexes: Reflexes are normal and symmetric.     Reflex Scores:      Brachioradialis reflexes are 2+ on the right side and 2+ on the left side.      Patellar reflexes are 2+ on the  right side and 2+ on the left side. Psychiatric:        Attention and Perception: Attention normal.        Mood and Affect: Mood normal.        Speech: Speech normal.        Behavior: Behavior normal. Behavior is cooperative.        Thought Content: Thought content normal.        Judgment: Judgment normal.    Results for orders placed or performed in visit on 02/02/24  Comprehensive metabolic panel   Collection Time: 02/02/24 10:18 AM  Result Value Ref Range   Glucose 72 70 - 99 mg/dL   BUN 7 (L) 8 - 27 mg/dL   Creatinine, Ser 9.18 0.57 - 1.00 mg/dL   eGFR 75 >40 fO/fpw/8.26   BUN/Creatinine Ratio 9 (L) 12 - 28   Sodium 141 134 - 144 mmol/L   Potassium 4.3 3.5 - 5.2 mmol/L   Chloride 101 96 - 106 mmol/L   CO2 26 20 - 29 mmol/L   Calcium  9.4 8.7 - 10.3 mg/dL   Total Protein 6.6 6.0 - 8.5 g/dL   Albumin 4.2 3.8 - 4.8 g/dL   Globulin, Total 2.4 1.5 - 4.5 g/dL   Bilirubin Total 0.5 0.0 - 1.2 mg/dL   Alkaline Phosphatase 114 44 - 121 IU/L   AST 23 0 - 40 IU/L   ALT 16 0 - 32 IU/L  Lipid Panel w/o Chol/HDL Ratio   Collection Time: 02/02/24 10:18 AM  Result Value Ref Range   Cholesterol, Total 132 100 - 199 mg/dL   Triglycerides 66 0 - 149 mg/dL   HDL 70 >60 mg/dL   VLDL Cholesterol Cal 14 5 - 40 mg/dL   LDL Chol Calc  (NIH) 48 0 - 99 mg/dL  CBC with Differential/Platelet   Collection Time: 02/02/24 10:18 AM  Result Value Ref Range   WBC 4.1 3.4 - 10.8 x10E3/uL   RBC 3.73 (L) 3.77 - 5.28 x10E6/uL   Hemoglobin 11.8 11.1 - 15.9 g/dL   Hematocrit 63.8 65.9 - 46.6 %   MCV 97 79 - 97 fL   MCH 31.6 26.6 - 33.0 pg   MCHC 32.7 31.5 - 35.7 g/dL   RDW 88.0 88.2 - 84.5 %   Platelets 144 (L) 150 - 450 x10E3/uL   Neutrophils 58 Not Estab. %   Lymphs 30 Not Estab. %   Monocytes 6 Not Estab. %   Eos 5 Not Estab. %   Basos 1 Not Estab. %   Neutrophils Absolute 2.4 1.4 - 7.0 x10E3/uL   Lymphocytes Absolute 1.2 0.7 - 3.1 x10E3/uL   Monocytes Absolute 0.2 0.1 - 0.9 x10E3/uL   EOS (ABSOLUTE) 0.2 0.0 - 0.4 x10E3/uL   Basophils Absolute 0.0 0.0 - 0.2 x10E3/uL   Immature Granulocytes 0 Not Estab. %   Immature Grans (Abs) 0.0 0.0 - 0.1 x10E3/uL      Assessment & Plan:   Problem List Items Addressed This Visit       Cardiovascular and Mediastinum   Essential hypertension, benign   Chronic, stable.  BP at goal for her age on recheck.  Will continue diet focus and if needed in future restart HCTZ which has worked well in past, or add on ARB, avoid ACE due to COPD.  LABS: CBC, TSH, urine ALB, CMP.  Return in 6 months.      Relevant Medications   rosuvastatin  (CRESTOR ) 20 MG tablet   Other Relevant Orders  Comprehensive metabolic panel with GFR   Microalbumin, Urine Waived     Respiratory   COPD (chronic obstructive pulmonary disease) (HCC) - Primary   Chronic, ongoing. Spirometry July 2023 = FEV1 52% FEV1/FVC 113%. Continue Stiolto daily and Albuterol  as needed.  Continue Singulair  and Claritin for allergies as she is tolerating well.  Return in 6 months.  Spirometry next visit.  Overall exacerbations have reduced on current inhaler regimen.      Relevant Medications   Tiotropium Bromide -Olodaterol (STIOLTO RESPIMAT ) 2.5-2.5 MCG/ACT AERS   montelukast  (SINGULAIR ) 10 MG tablet   albuterol  (VENTOLIN  HFA) 108  (90 Base) MCG/ACT inhaler   Other Relevant Orders   CBC with Differential/Platelet     Musculoskeletal and Integument   Osteopenia of neck of left femur   Ongoing noted on recent DEXA, some decline from previous with T-score -2.3 and previous in 2015 was -1.2.  she did have fracture of left arm this year, recommend repeat DEXA with mammogram and provided number to call.  Take Vitamin D3 2000 units daily and intake adequate calcium  daily + perform weight bearing exercises. Check Vit D level today.      Relevant Orders   VITAMIN D  25 Hydroxy (Vit-D Deficiency, Fractures)   DG Bone Density     Other   Hyperlipidemia   Chronic, ongoing.  Continue current medication regimen and adjust as needed.  Lipid panel today.      Relevant Medications   rosuvastatin  (CRESTOR ) 20 MG tablet   Other Relevant Orders   Comprehensive metabolic panel with GFR   Lipid Panel w/o Chol/HDL Ratio   Former light tobacco smoker   Quit over 30 years ago, recommend continued cessation.      Elevated TSH   No current symptoms.  Recheck TSH and Free T4.      Relevant Orders   T4, free   TSH   Depression, recurrent (HCC)   Chronic, stable.  Will continue Sertraline  and Buspar  at this time.  Buspar  safer alternative to take for increased anxiety.  Denies SI/HI.  Return to office in 6 months.      Relevant Medications   sertraline  (ZOLOFT ) 100 MG tablet   busPIRone  (BUSPAR ) 5 MG tablet   Anxiety   Refer to depression plan of care.      Relevant Medications   sertraline  (ZOLOFT ) 100 MG tablet   busPIRone  (BUSPAR ) 5 MG tablet   Other Visit Diagnoses       Flu vaccine need       Flu vaccine today, educated on this.   Relevant Orders   Flu vaccine HIGH DOSE PF(Fluzone Trivalent) (Completed)     Encounter for screening mammogram for malignant neoplasm of breast       Mammogram ordered and instructed how to schedule.   Relevant Orders   MM 3D SCREENING MAMMOGRAM BILATERAL BREAST     Encounter for  annual physical exam       Annual physical today with labs and health maintenance reviewed, discussed with patient.        Follow up plan: Return in about 6 months (around 02/03/2025) for COPD, HLD, MOOD, OSTEOPENIA -- plus needs spirometry.   LABORATORY TESTING:  - Pap smear: Not applicable  IMMUNIZATIONS:   - Tdap: Tetanus vaccination status reviewed: last tetanus booster within 10 years. - Influenza: Provided today - Pneumovax: Up to date - Prevnar: Up to date - COVID: Up to date - HPV: Not applicable - Shingrix vaccine: Up to date  SCREENING: -  Mammogram: Up to date due next December 20, 2026 - Colonoscopy: Up to date -- no longer applicable, age 3 - Bone Density: Up to date  -Hearing Test: Not applicable  -Spirometry: Up To Date  PATIENT COUNSELING:   Advised to take 1 mg of folate supplement per day if capable of pregnancy.   Sexuality: Discussed sexually transmitted diseases, partner selection, use of condoms, avoidance of unintended pregnancy  and contraceptive alternatives.   Advised to avoid cigarette smoking.  I discussed with the patient that most people either abstain from alcohol or drink within safe limits (<=14/week and <=4 drinks/occasion for males, <=7/weeks and <= 3 drinks/occasion for females) and that the risk for alcohol disorders and other health effects rises proportionally with the number of drinks per week and how often a drinker exceeds daily limits.  Discussed cessation/primary prevention of drug use and availability of treatment for abuse.   Diet: Encouraged to adjust caloric intake to maintain  or achieve ideal body weight, to reduce intake of dietary saturated fat and total fat, to limit sodium intake by avoiding high sodium foods and not adding table salt, and to maintain adequate dietary potassium and calcium  preferably from fresh fruits, vegetables, and low-fat dairy products.    Stressed the importance of regular exercise  Injury prevention:  Discussed safety belts, safety helmets, smoke detector, smoking near bedding or upholstery.   Dental health: Discussed importance of regular tooth brushing, flossing, and dental visits.    NEXT PREVENTATIVE PHYSICAL DUE IN 1 YEAR. Return in about 6 months (around 02/03/2025) for COPD, HLD, MOOD, OSTEOPENIA -- plus needs spirometry.

## 2024-08-06 NOTE — Assessment & Plan Note (Signed)
 Chronic, ongoing.  Continue current medication regimen and adjust as needed. Lipid panel today.

## 2024-08-06 NOTE — Assessment & Plan Note (Signed)
 Chronic, ongoing. Spirometry July 2023 = FEV1 52% FEV1/FVC 113%. Continue Stiolto daily and Albuterol as needed.  Continue Singulair and Claritin for allergies as she is tolerating well.  Return in 6 months.  Spirometry next visit.  Overall exacerbations have reduced on current inhaler regimen.

## 2024-08-06 NOTE — Assessment & Plan Note (Signed)
Chronic, stable.  Will continue Sertraline and Buspar at this time.  Buspar safer alternative to take for increased anxiety.  Denies SI/HI.  Return to office in 6 months.

## 2024-08-07 ENCOUNTER — Ambulatory Visit: Payer: Self-pay | Admitting: Nurse Practitioner

## 2024-08-07 LAB — COMPREHENSIVE METABOLIC PANEL WITH GFR
ALT: 22 IU/L (ref 0–32)
AST: 30 IU/L (ref 0–40)
Albumin: 4.2 g/dL (ref 3.8–4.8)
Alkaline Phosphatase: 106 IU/L (ref 44–121)
BUN/Creatinine Ratio: 14 (ref 12–28)
BUN: 10 mg/dL (ref 8–27)
Bilirubin Total: 0.4 mg/dL (ref 0.0–1.2)
CO2: 25 mmol/L (ref 20–29)
Calcium: 9.4 mg/dL (ref 8.7–10.3)
Chloride: 100 mmol/L (ref 96–106)
Creatinine, Ser: 0.72 mg/dL (ref 0.57–1.00)
Globulin, Total: 2.6 g/dL (ref 1.5–4.5)
Glucose: 81 mg/dL (ref 70–99)
Potassium: 4.2 mmol/L (ref 3.5–5.2)
Sodium: 140 mmol/L (ref 134–144)
Total Protein: 6.8 g/dL (ref 6.0–8.5)
eGFR: 86 mL/min/1.73 (ref >59)

## 2024-08-07 LAB — CBC WITH DIFFERENTIAL/PLATELET
Basophils Absolute: 0 x10E3/uL (ref 0.0–0.2)
Basos: 1 %
EOS (ABSOLUTE): 0.2 x10E3/uL (ref 0.0–0.4)
Eos: 5 %
Hematocrit: 36.5 % (ref 34.0–46.6)
Hemoglobin: 11.6 g/dL (ref 11.1–15.9)
Immature Grans (Abs): 0 x10E3/uL (ref 0.0–0.1)
Immature Granulocytes: 0 %
Lymphocytes Absolute: 1.5 x10E3/uL (ref 0.7–3.1)
Lymphs: 39 %
MCH: 31.2 pg (ref 26.6–33.0)
MCHC: 31.8 g/dL (ref 31.5–35.7)
MCV: 98 fL — ABNORMAL HIGH (ref 79–97)
Monocytes Absolute: 0.3 x10E3/uL (ref 0.1–0.9)
Monocytes: 8 %
Neutrophils Absolute: 1.8 x10E3/uL (ref 1.4–7.0)
Neutrophils: 47 %
Platelets: 157 x10E3/uL (ref 150–450)
RBC: 3.72 x10E6/uL — ABNORMAL LOW (ref 3.77–5.28)
RDW: 12 % (ref 11.7–15.4)
WBC: 3.9 x10E3/uL (ref 3.4–10.8)

## 2024-08-07 LAB — LIPID PANEL W/O CHOL/HDL RATIO
Cholesterol, Total: 130 mg/dL (ref 100–199)
HDL: 67 mg/dL (ref >39)
LDL Chol Calc (NIH): 51 mg/dL (ref 0–99)
Triglycerides: 53 mg/dL (ref 0–149)
VLDL Cholesterol Cal: 12 mg/dL (ref 5–40)

## 2024-08-07 LAB — VITAMIN D 25 HYDROXY (VIT D DEFICIENCY, FRACTURES): Vit D, 25-Hydroxy: 60.1 ng/mL (ref 30.0–100.0)

## 2024-08-07 LAB — TSH: TSH: 4 u[IU]/mL (ref 0.450–4.500)

## 2024-08-07 LAB — T4, FREE: Free T4: 1.04 ng/dL (ref 0.82–1.77)

## 2024-08-07 NOTE — Progress Notes (Signed)
 Contacted via MyChart  Good day Shelly Bridges, your labs have returned and are overall nice and stable.  No changes needed in medications.  Keep up the good work.  Any questions? Keep being amazing!!  Thank you for allowing me to participate in your care.  I appreciate you. Kindest regards, Jo-Ann Johanning

## 2024-08-22 ENCOUNTER — Other Ambulatory Visit: Payer: Self-pay | Admitting: Nurse Practitioner

## 2024-08-23 NOTE — Telephone Encounter (Signed)
 Duplicate request, LRF 08/06/24 FOR 90 DAYS.  Requested Prescriptions  Pending Prescriptions Disp Refills   montelukast  (SINGULAIR ) 10 MG tablet [Pharmacy Med Name: MONTELUKAST  10MG  TABLETS] 90 tablet 4    Sig: TAKE 1 TABLET(10 MG) BY MOUTH AT BEDTIME     Pulmonology:  Leukotriene Inhibitors Passed - 08/23/2024  8:59 AM      Passed - Valid encounter within last 12 months    Recent Outpatient Visits           2 weeks ago Simple chronic bronchitis (HCC)   [place] [facility] [place], Iva T, NP   4 months ago Bunion of great toe of left foot   [place] [facility] [place], [place] T, NP   6 months ago Simple chronic bronchitis (HCC)   [place] [facility] [place], Perkins T, NP               rosuvastatin  (CRESTOR ) 20 MG tablet [Pharmacy Med Name: ROSUVASTATIN  20MG  TABLETS] 90 tablet 4    Sig: TAKE 1 TABLET(20 MG) BY MOUTH DAILY     Cardiovascular:  Antilipid - Statins 2 Failed - 08/23/2024  8:59 AM      Failed - Lipid Panel in normal range within the last 12 months    Cholesterol, Total  Date Value Ref Range Status  08/06/2024 130 100 - 199 mg/dL Final   LDL Chol Calc (NIH)  Date Value Ref Range Status  08/06/2024 51 0 - 99 mg/dL Final   HDL  Date Value Ref Range Status  08/06/2024 67 >39 mg/dL Final   Triglycerides  Date Value Ref Range Status  08/06/2024 53 0 - 149 mg/dL Final         Passed - Cr in normal range and within 360 days    Creatinine, Ser  Date Value Ref Range Status  08/06/2024 0.72 0.57 - 1.00 mg/dL Final         Passed - Patient is not pregnant      Passed - Valid encounter within last 12 months    Recent Outpatient Visits           2 weeks ago Simple chronic bronchitis (HCC)   [place] [facility] [place], [place] T, NP   4 months ago Bunion of great toe of left foot   [place] [facility] [place], [place] T, NP   6 months ago Simple chronic bronchitis (HCC)    [place] [facility] [place], Melanie DASEN, NP

## 2024-08-30 ENCOUNTER — Other Ambulatory Visit: Payer: Self-pay | Admitting: Nurse Practitioner

## 2024-09-02 NOTE — Telephone Encounter (Signed)
 Requested medication (s) are due for refill today: yes  Requested medication (s) are on the active medication list: yes  Last refill:  08/06/24  Future visit scheduled: yes  Notes to clinic:   Medication not assigned to a protocol, review manually.      Requested Prescriptions  Pending Prescriptions Disp Refills   STIOLTO RESPIMAT  2.5-2.5 MCG/ACT AERS [Pharmacy Med Name: STIOLTO RESPIMAT  2.5/2.5MCG INH 4GM] 4 g 12    Sig: Inhale 2 puffs into the lungs daily.     Off-Protocol Failed - 09/02/2024 12:09 PM      Failed - Medication not assigned to a protocol, review manually.      Passed - Valid encounter within last 12 months    Recent Outpatient Visits           3 weeks ago Simple chronic bronchitis (HCC)   [place] [facility] [place], [place] T, NP   4 months ago Bunion of great toe of left foot   [place] [place], [place] T, NP   7 months ago Simple chronic bronchitis [facility])   [place] [facility] [place], Melanie DASEN, NP

## 2024-09-04 ENCOUNTER — Ambulatory Visit
Admission: RE | Admit: 2024-09-04 | Discharge: 2024-09-04 | Disposition: A | Source: Ambulatory Visit | Attending: Nurse Practitioner | Admitting: Nurse Practitioner

## 2024-09-04 ENCOUNTER — Ambulatory Visit
Admission: RE | Admit: 2024-09-04 | Discharge: 2024-09-04 | Disposition: A | Discharge status: Home / Self Care | Source: Ambulatory Visit | Attending: Nurse Practitioner | Admitting: Nurse Practitioner

## 2024-09-04 DIAGNOSIS — M85852 Other specified disorders of bone density and structure, left thigh: Secondary | ICD-10-CM | POA: Diagnosis not present

## 2024-09-04 DIAGNOSIS — M8589 Other specified disorders of bone density and structure, multiple sites: Secondary | ICD-10-CM | POA: Diagnosis not present

## 2024-09-04 DIAGNOSIS — Z780 Asymptomatic menopausal state: Secondary | ICD-10-CM | POA: Diagnosis not present

## 2024-09-04 DIAGNOSIS — Z1231 Encounter for screening mammogram for malignant neoplasm of breast: Secondary | ICD-10-CM | POA: Insufficient documentation

## 2024-09-04 NOTE — Progress Notes (Signed)
Your bone density shows thinning bones (osteopenia) but not brittle (osteoporosis). We recommend Vitamin D supplementation of about 2,0000 IUs of over the counter Vitamin D3. In addition, we recommend a diet high in calcium with dairy and dark green leafy vegetables. We would like you to get plenty of weight bearing exercises with walking and resistance training such as light weights or resistance bands available with instructions at places such as [employer].

## 2024-09-05 ENCOUNTER — Inpatient Hospital Stay
Admission: RE | Admit: 2024-09-05 | Discharge: 2024-09-05 | Disposition: A | Payer: Self-pay | Source: Ambulatory Visit | Attending: Nurse Practitioner | Admitting: Nurse Practitioner

## 2024-09-05 ENCOUNTER — Other Ambulatory Visit: Payer: Self-pay | Admitting: *Deleted

## 2024-09-05 DIAGNOSIS — Z1231 Encounter for screening mammogram for malignant neoplasm of breast: Secondary | ICD-10-CM

## 2024-09-11 NOTE — Progress Notes (Signed)
 Contacted via MyChart   Normal mammogram, may repeat in one year:)

## 2024-10-25 ENCOUNTER — Encounter: Payer: Self-pay | Admitting: Nurse Practitioner

## 2024-10-25 ENCOUNTER — Telehealth (INDEPENDENT_AMBULATORY_CARE_PROVIDER_SITE_OTHER): Admitting: Nurse Practitioner

## 2024-10-25 VITALS — Temp 99.0°F | Resp 16 | Ht 59.02 in | Wt 120.0 lb

## 2024-10-25 DIAGNOSIS — J441 Chronic obstructive pulmonary disease with (acute) exacerbation: Secondary | ICD-10-CM

## 2024-10-25 MED ORDER — AZITHROMYCIN 250 MG PO TABS: ORAL_TABLET | ORAL | 0 refills | Status: AC

## 2024-10-25 MED ORDER — HYDROCOD POLI-CHLORPHE POLI ER 10-8 MG/5ML PO SUER: 5.0000 mL | Freq: Every evening | ORAL | 0 refills | Status: AC | PRN

## 2024-10-25 MED ORDER — PREDNISONE 20 MG PO TABS: 40.0000 mg | ORAL_TABLET | Freq: Every day | ORAL | 0 refills | Status: AC

## 2024-10-25 NOTE — Assessment & Plan Note (Signed)
 Acute exacerbation of her COPD.  At this time will start Zpack for 5 days + Prednisone  40 MG daily for 5 days.  For symptoms management: Tussionex at night for cough + Albuterol  nebulizer as needed.  Continue current inhaler regimen at this time.  Recommend: - Increased rest - Increasing Fluids - Acetaminophen  as needed for fever/pain.  - Salt water gargling, chloraseptic spray and throat lozenges - Mucinex .  - Humidifying the air.  If no improvement in one week then recommend in office visit for lung check. Strict ER precautions given.

## 2024-10-25 NOTE — Progress Notes (Signed)
 Temp 99 F (37.2 C) (Oral)   Resp 16   Ht 4' 11.02 (1.499 m)   Wt 120 lb (54.4 kg)   BMI 24.22 kg/m    Subjective:    Patient ID: Shelly Bridges, female    DOB: 09/29/1946, 78 y.o.   MRN: [id number]  HPI: Shelly Bridges is a 78 y.o. female  Chief Complaint  Patient presents with   Cough    Since yesterday/last night. Getting worse. Low grade fever, chest tightness. SOB and struggles with each cough. Normally uses a prednisone  and liquid tussinex, also used Tessalon  pearls but not sure how well they worked.    Virtual Visit via Video Note  I connected with Shelly Bridges on 10/25/24 at  3:40 PM EST by a video enabled telemedicine application and verified that I am speaking with the correct person using two identifiers.  Location: Patient: home Provider: work   I discussed the limitations of evaluation and management by telemedicine and the availability of in person appointments. The patient expressed understanding and agreed to proceed.  I discussed the assessment and treatment plan with the patient. The patient was provided an opportunity to ask questions and all were answered. The patient agreed with the plan and demonstrated an understanding of the instructions.   The patient was advised to call back or seek an in-person evaluation if the symptoms worsen or if the condition fails to improve as anticipated.  I provided 25 minutes of non-face-to-face time during this encounter.   Glinda Natzke T Naveed Humphres, NP   COPD Started with exacerbation last night.  Is using Stiolto and Albuterol . Used nebulizer this morning. Is coughing and this is productive. Not around anyone who is sick. Last exacerbation 02/21/23. Zpack and Prednisone  have worked well for her in past with no side effects. COPD status: exacerbated Satisfied with current treatment?: yes Oxygen use: no Dyspnea frequency: some SOB with coughing Cough frequency: yes, productive Rescue inhaler  frequency:  more often at present Limitation of activity: no Productive cough: yes Last Spirometry: 06/20/2022 Pneumovax: Up to Date Influenza: Up to Date   Relevant past medical, surgical, family and social history reviewed and updated as indicated. Interim medical history since our last visit reviewed. Allergies and medications reviewed and updated.  Review of Systems  Constitutional:  Positive for fatigue. Negative for activity change, appetite change, chills and fever.  HENT:  Positive for congestion, postnasal drip and rhinorrhea. Negative for ear discharge, ear pain, facial swelling, sinus pressure, sinus pain, sneezing, sore throat and voice change.   Eyes:  Negative for pain and visual disturbance.  Respiratory:  Positive for cough, chest tightness, shortness of breath and wheezing.   Cardiovascular:  Negative for chest pain, palpitations and leg swelling.  Gastrointestinal: Negative.   Neurological:  Negative for dizziness, numbness and headaches.  Psychiatric/Behavioral: Negative.      Per HPI unless specifically indicated above     Objective:    Temp 99 F (37.2 C) (Oral)   Resp 16   Ht 4' 11.02 (1.499 m)   Wt 120 lb (54.4 kg)   BMI 24.22 kg/m   Wt Readings from Last 3 Encounters:  10/25/24 120 lb (54.4 kg)  08/06/24 128 lb (58.1 kg)  04/19/24 126 lb 9.6 oz (57.4 kg)    Physical Exam Vitals and nursing note reviewed.  Constitutional:      General: She is awake. She is not in acute distress.    Appearance: She is well-developed. She is  not ill-appearing.  HENT:     Head: Normocephalic.     Right Ear: Hearing normal.     Left Ear: Hearing normal.  Eyes:     General: Lids are normal.        Right eye: No discharge.        Left eye: No discharge.     Conjunctiva/sclera: Conjunctivae normal.  Pulmonary:     Effort: Pulmonary effort is normal. No accessory muscle usage or respiratory distress.     Comments: Intermittent coughing present with audible  wheezing. No SOB with talking. Musculoskeletal:     Cervical back: Normal range of motion.  Neurological:     Mental Status: She is alert and oriented to person, place, and time.  Psychiatric:        Attention and Perception: Attention normal.        Mood and Affect: Mood normal.        Behavior: Behavior normal. Behavior is cooperative.        Thought Content: Thought content normal.        Judgment: Judgment normal.     Results for orders placed or performed in visit on 08/06/24  Microalbumin, Urine Waived   Collection Time: 08/06/24  9:33 AM  Result Value Ref Range   Microalb, Ur Waived 80 (H) 0 - 19 mg/L   Creatinine, Urine Waived 100 10 - 300 mg/dL   Microalb/Creat Ratio 30-300 (H) <30 mg/g  T4, free   Collection Time: 08/06/24  9:34 AM  Result Value Ref Range   Free T4 1.04 0.82 - 1.77 ng/dL  CBC with Differential/Platelet   Collection Time: 08/06/24  9:34 AM  Result Value Ref Range   WBC 3.9 3.4 - 10.8 x10E3/uL   RBC 3.72 (L) 3.77 - 5.28 x10E6/uL   Hemoglobin 11.6 11.1 - 15.9 g/dL   Hematocrit 63.4 65.9 - 46.6 %   MCV 98 (H) 79 - 97 fL   MCH 31.2 26.6 - 33.0 pg   MCHC 31.8 31.5 - 35.7 g/dL   RDW 87.9 88.2 - 84.5 %   Platelets 157 150 - 450 x10E3/uL   Neutrophils 47 Not Estab. %   Lymphs 39 Not Estab. %   Monocytes 8 Not Estab. %   Eos 5 Not Estab. %   Basos 1 Not Estab. %   Neutrophils Absolute 1.8 1.4 - 7.0 x10E3/uL   Lymphocytes Absolute 1.5 0.7 - 3.1 x10E3/uL   Monocytes Absolute 0.3 0.1 - 0.9 x10E3/uL   EOS (ABSOLUTE) 0.2 0.0 - 0.4 x10E3/uL   Basophils Absolute 0.0 0.0 - 0.2 x10E3/uL   Immature Granulocytes 0 Not Estab. %   Immature Grans (Abs) 0.0 0.0 - 0.1 x10E3/uL  Comprehensive metabolic panel with GFR   Collection Time: 08/06/24  9:34 AM  Result Value Ref Range   Glucose 81 70 - 99 mg/dL   BUN 10 8 - 27 mg/dL   Creatinine, Ser 9.27 0.57 - 1.00 mg/dL   eGFR 86 >40 fO/fpw/8.26   BUN/Creatinine Ratio 14 12 - 28   Sodium 140 134 - 144 mmol/L    Potassium 4.2 3.5 - 5.2 mmol/L   Chloride 100 96 - 106 mmol/L   CO2 25 20 - 29 mmol/L   Calcium  9.4 8.7 - 10.3 mg/dL   Total Protein 6.8 6.0 - 8.5 g/dL   Albumin 4.2 3.8 - 4.8 g/dL   Globulin, Total 2.6 1.5 - 4.5 g/dL   Bilirubin Total 0.4 0.0 - 1.2 mg/dL   Alkaline  Phosphatase 106 44 - 121 IU/L   AST 30 0 - 40 IU/L   ALT 22 0 - 32 IU/L  Lipid Panel w/o Chol/HDL Ratio   Collection Time: 08/06/24  9:34 AM  Result Value Ref Range   Cholesterol, Total 130 100 - 199 mg/dL   Triglycerides 53 0 - 149 mg/dL   HDL 67 >60 mg/dL   VLDL Cholesterol Cal 12 5 - 40 mg/dL   LDL Chol Calc (NIH) 51 0 - 99 mg/dL  TSH   Collection Time: 08/06/24  9:34 AM  Result Value Ref Range   TSH 4.000 0.450 - 4.500 uIU/mL  VITAMIN D  25 Hydroxy (Vit-D Deficiency, Fractures)   Collection Time: 08/06/24  9:34 AM  Result Value Ref Range   Vit D, 25-Hydroxy 60.1 30.0 - 100.0 ng/mL      Assessment & Plan:   Problem List Items Addressed This Visit       Respiratory   COPD exacerbation (HCC) - Primary   Acute exacerbation of her COPD.  At this time will start Zpack for 5 days + Prednisone  40 MG daily for 5 days.  For symptoms management: Tussionex at night for cough + Albuterol  nebulizer as needed.  Continue current inhaler regimen at this time.  Recommend: - Increased rest - Increasing Fluids - Acetaminophen  as needed for fever/pain.  - Salt water gargling, chloraseptic spray and throat lozenges - Mucinex .  - Humidifying the air.  If no improvement in one week then recommend in office visit for lung check. Strict ER precautions given.      Relevant Medications   azithromycin  (ZITHROMAX ) 250 MG tablet   predniSONE  (DELTASONE ) 20 MG tablet   chlorpheniramine-HYDROcodone  (TUSSIONEX) 10-8 MG/5ML     Follow up plan: Return if symptoms worsen or fail to improve.

## 2024-10-25 NOTE — Patient Instructions (Signed)
 COPD Exacerbation Learn about triggers that can make COPD worse and how to avoid them. To view the content, go to this web address: [email or web address]  This video will expire on: 04/30/2026. If you need access to this video following this date, please reach out to the healthcare provider who assigned it to you. This information is not intended to replace advice given to you by your health care provider. Make sure you discuss any questions you have with your health care provider. [employer] Patient Education  [employer].

## 2024-10-25 NOTE — Telephone Encounter (Signed)
 Scheduled

## 2024-10-29 ENCOUNTER — Encounter: Payer: Self-pay | Admitting: Nurse Practitioner

## 2024-10-29 MED ORDER — AMOXICILLIN-POT CLAVULANATE 875-125 MG PO TABS: 1.0000 | ORAL_TABLET | Freq: Two times a day (BID) | ORAL | 0 refills | Status: AC

## 2024-11-29 ENCOUNTER — Encounter: Payer: Self-pay | Admitting: Nurse Practitioner

## 2024-12-02 ENCOUNTER — Encounter: Payer: Self-pay | Admitting: Nurse Practitioner

## 2025-02-05 ENCOUNTER — Ambulatory Visit: Admitting: Nurse Practitioner

## 2025-02-18 ENCOUNTER — Ambulatory Visit
